# Patient Record
Sex: Female | Born: 1990 | Race: White | Hispanic: No | Marital: Single | Smoking: Former smoker
Health system: Southern US, Community
[De-identification: ages and names within clinical notes are randomized; demographics above are authoritative.]

## PROBLEM LIST (undated history)

## (undated) ENCOUNTER — Inpatient Hospital Stay (HOSPITAL_COMMUNITY): Payer: Self-pay

## (undated) DIAGNOSIS — F909 Attention-deficit hyperactivity disorder, unspecified type: Secondary | ICD-10-CM

## (undated) DIAGNOSIS — F32A Depression, unspecified: Secondary | ICD-10-CM

## (undated) DIAGNOSIS — N760 Acute vaginitis: Secondary | ICD-10-CM

## (undated) DIAGNOSIS — A749 Chlamydial infection, unspecified: Secondary | ICD-10-CM

## (undated) DIAGNOSIS — Q369 Cleft lip, unilateral: Secondary | ICD-10-CM

## (undated) DIAGNOSIS — K219 Gastro-esophageal reflux disease without esophagitis: Secondary | ICD-10-CM

## (undated) DIAGNOSIS — B9689 Other specified bacterial agents as the cause of diseases classified elsewhere: Secondary | ICD-10-CM

## (undated) DIAGNOSIS — Z9889 Other specified postprocedural states: Secondary | ICD-10-CM

## (undated) DIAGNOSIS — I10 Essential (primary) hypertension: Secondary | ICD-10-CM

## (undated) DIAGNOSIS — F319 Bipolar disorder, unspecified: Secondary | ICD-10-CM

## (undated) DIAGNOSIS — F419 Anxiety disorder, unspecified: Secondary | ICD-10-CM

## (undated) DIAGNOSIS — R112 Nausea with vomiting, unspecified: Secondary | ICD-10-CM

## (undated) DIAGNOSIS — R519 Headache, unspecified: Secondary | ICD-10-CM

## (undated) HISTORY — PX: TONSILLECTOMY: SUR1361

## (undated) HISTORY — DX: Depression, unspecified: F32.A

## (undated) HISTORY — DX: Other specified postprocedural states: Z98.890

## (undated) HISTORY — PX: FRACTURE SURGERY: SHX138

## (undated) HISTORY — DX: Gastro-esophageal reflux disease without esophagitis: K21.9

## (undated) HISTORY — DX: Other specified postprocedural states: R11.2

## (undated) HISTORY — DX: Anxiety disorder, unspecified: F41.9

## (undated) HISTORY — PX: CLEFT LIP REPAIR: SUR1164

## (undated) HISTORY — DX: Cleft lip, unilateral: Q36.9

---
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
## 2011-04-12 ENCOUNTER — Emergency Department (HOSPITAL_COMMUNITY)
Admission: EM | Admit: 2011-04-12 | Discharge: 2011-04-12 | Disposition: A | Discharge status: Home / Self Care | Payer: Medicaid Other | Attending: Emergency Medicine | Admitting: Emergency Medicine

## 2011-04-12 DIAGNOSIS — F988 Other specified behavioral and emotional disorders with onset usually occurring in childhood and adolescence: Secondary | ICD-10-CM | POA: Insufficient documentation

## 2011-04-12 DIAGNOSIS — A499 Bacterial infection, unspecified: Secondary | ICD-10-CM | POA: Insufficient documentation

## 2011-04-12 DIAGNOSIS — N760 Acute vaginitis: Secondary | ICD-10-CM | POA: Insufficient documentation

## 2011-04-12 DIAGNOSIS — B9689 Other specified bacterial agents as the cause of diseases classified elsewhere: Secondary | ICD-10-CM | POA: Insufficient documentation

## 2011-04-12 DIAGNOSIS — N390 Urinary tract infection, site not specified: Secondary | ICD-10-CM | POA: Insufficient documentation

## 2011-04-12 LAB — URINE MICROSCOPIC-ADD ON

## 2011-04-12 LAB — URINALYSIS, ROUTINE W REFLEX MICROSCOPIC
Glucose, UA: NEGATIVE mg/dL
Specific Gravity, Urine: 1.025 (ref 1.005–1.030)
pH: 5.5 (ref 5.0–8.0)

## 2011-07-02 ENCOUNTER — Encounter (HOSPITAL_COMMUNITY): Payer: Self-pay | Admitting: *Deleted

## 2011-07-02 ENCOUNTER — Emergency Department (HOSPITAL_COMMUNITY)
Admission: EM | Admit: 2011-07-02 | Discharge: 2011-07-02 | Disposition: A | Discharge status: Home / Self Care | Payer: Self-pay | Attending: Emergency Medicine | Admitting: Emergency Medicine

## 2011-07-02 DIAGNOSIS — R109 Unspecified abdominal pain: Secondary | ICD-10-CM | POA: Insufficient documentation

## 2011-07-02 DIAGNOSIS — R51 Headache: Secondary | ICD-10-CM | POA: Insufficient documentation

## 2011-07-02 DIAGNOSIS — R112 Nausea with vomiting, unspecified: Secondary | ICD-10-CM | POA: Insufficient documentation

## 2011-07-02 DIAGNOSIS — R1110 Vomiting, unspecified: Secondary | ICD-10-CM

## 2011-07-02 LAB — COMPREHENSIVE METABOLIC PANEL
ALT: 16 U/L (ref 0–35)
Albumin: 3.8 g/dL (ref 3.5–5.2)
Alkaline Phosphatase: 90 U/L (ref 39–117)
BUN: 8 mg/dL (ref 6–23)
Chloride: 101 mEq/L (ref 96–112)
Potassium: 3.5 mEq/L (ref 3.5–5.1)
Sodium: 137 mEq/L (ref 135–145)
Total Bilirubin: 0.3 mg/dL (ref 0.3–1.2)
Total Protein: 7.2 g/dL (ref 6.0–8.3)

## 2011-07-02 LAB — URINALYSIS, ROUTINE W REFLEX MICROSCOPIC
Glucose, UA: NEGATIVE mg/dL
Leukocytes, UA: NEGATIVE
Nitrite: NEGATIVE
pH: 5.5 (ref 5.0–8.0)

## 2011-07-02 LAB — DIFFERENTIAL
Basophils Relative: 0 % (ref 0–1)
Lymphs Abs: 3.9 10*3/uL (ref 0.7–4.0)
Monocytes Relative: 6 % (ref 3–12)
Neutro Abs: 9.3 10*3/uL — ABNORMAL HIGH (ref 1.7–7.7)
Neutrophils Relative %: 64 % (ref 43–77)

## 2011-07-02 LAB — POCT PREGNANCY, URINE: Preg Test, Ur: NEGATIVE

## 2011-07-02 LAB — CBC
Hemoglobin: 12.3 g/dL (ref 12.0–15.0)
MCHC: 31.8 g/dL (ref 30.0–36.0)
Platelets: 307 10*3/uL (ref 150–400)
RBC: 4.66 MIL/uL (ref 3.87–5.11)

## 2011-07-02 LAB — LIPASE, BLOOD: Lipase: 22 U/L (ref 11–59)

## 2011-07-02 MED ORDER — METOCLOPRAMIDE HCL 5 MG/ML IJ SOLN
10.0000 mg | Freq: Once | INTRAMUSCULAR | Status: AC
  Administered 2011-07-02: 10 mg via INTRAVENOUS
  Filled 2011-07-02: qty 2

## 2011-07-02 MED ORDER — DEXAMETHASONE SODIUM PHOSPHATE 10 MG/ML IJ SOLN
10.0000 mg | Freq: Once | INTRAMUSCULAR | Status: AC
  Administered 2011-07-02: 10 mg via INTRAVENOUS
  Filled 2011-07-02: qty 1

## 2011-07-02 MED ORDER — DIPHENHYDRAMINE HCL 50 MG/ML IJ SOLN
25.0000 mg | Freq: Once | INTRAMUSCULAR | Status: AC
  Administered 2011-07-02: 25 mg via INTRAVENOUS
  Filled 2011-07-02: qty 1

## 2011-07-02 NOTE — ED Notes (Signed)
Pt in c/o abd pain with vomiting and a migraine x1 week

## 2011-07-02 NOTE — ED Notes (Signed)
C/o abdominal pain with nausea and vomiting for one week---thinks she may be pregnant.  Drinking water so she will be able to void for u/a and preg. Screen.

## 2011-07-02 NOTE — ED Provider Notes (Signed)
Patient was reassessed and was symptom-free. She was discharged home in good condition. She can continue using her Tylenol as needed for symptom control. She is told she is welcome to return if she has return of her headaches. No new medications will be prescribed at this time.  Cyndra Numbers, MD 07/02/11 [phone]

## 2011-07-02 NOTE — ED Provider Notes (Signed)
History     CSN: [id number]  Arrival date & time 07/02/11  1538   First MD Initiated Contact with Patient 07/02/11 1912      Chief Complaint  Patient presents with  . Abdominal Pain  . Migraine  . Emesis    (Consider location/radiation/quality/duration/timing/severity/associated sxs/prior treatment) HPI Comments: Patient with no significant past medical history presents with multiple complaints. Patient has had intermittent headache for the past week. Headache is frontal. It is associated with phonophobia but no photophobia. Patient has had 3-4 episodes of vomiting over the past week. She has a states some lower abdominal stomach cramping which she does not have at the current time. Patient has not had any trouble walking or any visual changes. She denies injury. Patient denies fever or upper respiratory tract symptoms including sinus pain. No dental pain. Patient does not have a significant history of headaches. Patient denies chest pain, cough, shortness of breath. She denies dysuria, vaginal bleeding. No constipation or diarrhea. Patient states there is a possibility that she could be pregnant.  Patient is a 21 y.o. female presenting with headaches. The history is provided by the patient.  Headache  This is a recurrent problem. The current episode started more than 2 days ago. Episode frequency: Intermittent. Progression since onset: Waxing and waning. The headache is associated with nothing. The pain is located in the frontal region. The quality of the pain is described as dull. The pain is mild. The pain does not radiate. Associated symptoms include nausea and vomiting. Pertinent negatives include no fever, no syncope and no shortness of breath. She has tried acetaminophen for the symptoms. The treatment provided moderate relief.    History reviewed. No pertinent past medical history.  History reviewed. No pertinent past surgical history.  History reviewed. No pertinent family  history.  History  Substance Use Topics  . Smoking status: Former Games developer  . Smokeless tobacco: Not on file  . Alcohol Use: No    OB History    Grav Para Term Preterm Abortions TAB SAB Ect Mult Living                  Review of Systems  Constitutional: Negative for fever and chills.  HENT: Negative for sore throat, rhinorrhea and neck pain.   Eyes: Negative for discharge.  Respiratory: Negative for shortness of breath.   Cardiovascular: Negative for chest pain and syncope.  Gastrointestinal: Positive for nausea, vomiting and abdominal pain. Negative for diarrhea, constipation and abdominal distention.  Genitourinary: Negative for dysuria and hematuria.  Musculoskeletal: Negative for myalgias.  Skin: Negative for rash.  Neurological: Positive for headaches.  Psychiatric/Behavioral: Negative for confusion.    Allergies  Review of patient's allergies indicates no known allergies.  Home Medications   Current Outpatient Rx  Name Route Sig Dispense Refill  . ACETAMINOPHEN 500 MG PO TABS Oral Take 1,000 mg by mouth every 6 (six) hours as needed. For pain.      BP 147/83  Pulse 56  Temp(Src) 97.9 F (36.6 C) (Oral)  Resp 20  SpO2 100%  LMP 06/14/2011  Physical Exam  Nursing note and vitals reviewed. Constitutional: She is oriented to person, place, and time. She appears well-developed and well-nourished.  HENT:  Head: Normocephalic and atraumatic.  Right Ear: Tympanic membrane and external ear normal.  Left Ear: Tympanic membrane and external ear normal.  Nose: Nose normal.  Mouth/Throat: Uvula is midline, oropharynx is clear and moist and mucous membranes are normal.  Eyes: Conjunctivae and  EOM are normal. Right eye exhibits no discharge. Left eye exhibits no discharge.  Neck: Normal range of motion. Neck supple.  Cardiovascular: Normal rate and regular rhythm.   Pulmonary/Chest: Effort normal and breath sounds normal. No respiratory distress.  Abdominal: Soft.  Bowel sounds are normal. There is no tenderness. There is no rebound and no guarding.  Musculoskeletal: Normal range of motion. She exhibits no edema and no tenderness.  Neurological: She is alert and oriented to person, place, and time. She has normal strength. No cranial nerve deficit or sensory deficit. GCS eye subscore is 4. GCS verbal subscore is 5. GCS motor subscore is 6.  Skin: Skin is warm and dry. No rash noted.  Psychiatric: She has a normal mood and affect.    ED Course  Procedures (including critical care time)   Labs Reviewed  POCT PREGNANCY, URINE  URINALYSIS, ROUTINE W REFLEX MICROSCOPIC  POCT PREGNANCY, URINE  CBC  DIFFERENTIAL  COMPREHENSIVE METABOLIC PANEL  LIPASE, BLOOD   No results found.   1. Headache   2. Vomiting     7:59 PM patient seen and examined. UA/upreg ordered.  Patient may need lab tests if urine test is negative.  8:27 PM urine pregnancy is negative. Will treat for headache. Will send basic labs.  9:01 PM Handoff to Dr. Alto Denver who will follow-up with labs and dispo per lab results. Patient was informed of urine tests and plan. She is getting headache medicine now.   MDM  HA, abdominal pain, vomiting. PT appears well. Labs pending. HA treatment given. No concern for meningitis or intracranial process.        Carolee Rota, [place] 07/02/11 2103

## 2011-07-02 NOTE — ED Notes (Signed)
Patient cannot urinate at this time.

## 2011-07-03 NOTE — ED Provider Notes (Addendum)
Medical screening examination/treatment/procedure(s) were conducted as a shared visit with non-physician practitioner(s) and myself.  I personally evaluated the patient during the encounter  Cyndra Numbers, MD 07/03/11 [phone]

## 2011-08-23 ENCOUNTER — Emergency Department (HOSPITAL_COMMUNITY)
Admission: EM | Admit: 2011-08-23 | Discharge: 2011-08-24 | Disposition: A | Discharge status: Home / Self Care | Payer: Self-pay | Attending: Emergency Medicine | Admitting: Emergency Medicine

## 2011-08-23 DIAGNOSIS — R0602 Shortness of breath: Secondary | ICD-10-CM | POA: Insufficient documentation

## 2011-08-23 DIAGNOSIS — J111 Influenza due to unidentified influenza virus with other respiratory manifestations: Secondary | ICD-10-CM | POA: Insufficient documentation

## 2011-08-23 DIAGNOSIS — R062 Wheezing: Secondary | ICD-10-CM | POA: Insufficient documentation

## 2011-08-24 ENCOUNTER — Encounter (HOSPITAL_COMMUNITY): Payer: Self-pay | Admitting: Emergency Medicine

## 2011-08-24 ENCOUNTER — Emergency Department (HOSPITAL_COMMUNITY): Payer: Self-pay

## 2011-08-24 MED ORDER — IPRATROPIUM BROMIDE 0.02 % IN SOLN
0.5000 mg | Freq: Once | RESPIRATORY_TRACT | Status: AC
  Administered 2011-08-24: 0.5 mg via RESPIRATORY_TRACT
  Filled 2011-08-24: qty 2.5

## 2011-08-24 MED ORDER — HYDROCODONE-ACETAMINOPHEN 7.5-500 MG/15ML PO SOLN: 10.0000 mL | ORAL | Status: AC | PRN

## 2011-08-24 MED ORDER — ALBUTEROL SULFATE (5 MG/ML) 0.5% IN NEBU
5.0000 mg | INHALATION_SOLUTION | Freq: Once | RESPIRATORY_TRACT | Status: AC
  Administered 2011-08-24: 5 mg via RESPIRATORY_TRACT
  Filled 2011-08-24: qty 1

## 2011-08-24 MED ORDER — ALBUTEROL SULFATE HFA 108 (90 BASE) MCG/ACT IN AERS
2.0000 | INHALATION_SPRAY | RESPIRATORY_TRACT | Status: DC | PRN
  Administered 2011-08-24: 2 via RESPIRATORY_TRACT
  Filled 2011-08-24: qty 6.7

## 2011-08-24 NOTE — ED Provider Notes (Signed)
History     CSN: [id number]  Arrival date & time 08/23/11  2346   First MD Initiated Contact with Patient 08/24/11 0234      Chief Complaint  Patient presents with  . URI     (Consider location/radiation/quality/duration/timing/severity/associated sxs/prior treatment) HPI This is a 21 year old white female with a 6 day history of cough, sore throat, malaise, nasal congestion and shortness of breath. She's been taking over-the-counter Robitussin and Tylenol without relief. She states the sore throat is severe and worse with swallowing. She has some mild ear ache. She denies nausea, vomiting or diarrhea. She's not aware of wheezing. She's had minimal myalgias.  History reviewed. No pertinent past medical history.  Past Surgical History  Procedure Date  . Cleft lip repair   . Tonsillectomy     History reviewed. No pertinent family history.  History  Substance Use Topics  . Smoking status: Former Games developer  . Smokeless tobacco: Not on file  . Alcohol Use: Yes    OB History    Grav Para Term Preterm Abortions TAB SAB Ect Mult Living                  Review of Systems  All other systems reviewed and are negative.    Allergies  Review of patient's allergies indicates no known allergies.  Home Medications   Current Outpatient Rx  Name Route Sig Dispense Refill  . ACETAMINOPHEN 500 MG PO TABS Oral Take 1,000 mg by mouth every 6 (six) hours as needed. For pain.    Marland Kitchen GUAIFENESIN 100 MG/5ML PO LIQD Oral Take 200 mg by mouth 3 (three) times daily as needed.      BP 130/68  Pulse 90  Temp(Src) 98.8 F (37.1 C) (Oral)  Resp 18  SpO2 95%  LMP 07/14/2011  Physical Exam General: Well-developed, well-nourished female in no acute distress; appearance consistent with age of record HENT: normocephalic, atraumatic; no erythema of TM; no pharyngeal erythema or exudate; nasal congestion Eyes: pupils equal round and reactive to light; extraocular muscles intact Neck:  supple Heart: regular rate and rhythm; distant sounds Lungs: Decreased air movement bilaterally without frank wheezing Abdomen: soft; nondistended; nontender Extremities: No deformity; full range of motion Neurologic: Awake, alert and oriented; motor function intact in all extremities and symmetric; no facial droop Skin: Warm and dry     ED Course  Procedures (including critical care time)     MDM   Nursing notes and vitals signs, including pulse oximetry, reviewed.  Summary of this visit's results, reviewed by myself:  Imaging Studies: Dg Chest 2 View  08/24/2011  *RADIOLOGY REPORT*  Clinical Data: Cough, shortness of breath, wheezing  CHEST - 2 VIEW  Comparison: None.  Findings: Lungs are clear. No pleural effusion or pneumothorax. The cardiomediastinal contours are within normal limits. The visualized bones and soft tissues are without significant appreciable abnormality.  IMPRESSION: No acute cardiopulmonary process identified.  Original Report Authenticated By: Waneta Martins, M.D.   4:33 AM Air movement improved after albuterol neb treatment. We'll provide albuterol inhaler and pain and cough relief.          Hanley Seamen, MD 08/24/11 [phone]

## 2011-08-24 NOTE — ED Notes (Addendum)
Pt presented to the Er with c/o cough, productive, generalized weakness, sore throat and otalgia for the last 6 days now, also reports taking while at home Mucinex, Tylenol, Alcasel for cold. Lungs clear in all lobes. Pt also reports missing her menstrual cycle.

## 2011-08-24 NOTE — ED Notes (Signed)
Pt states she began having a sore throat on Sunday accompanied with cough that has gotten worse.  States that today both her ears started hurting.  "My whole head is stopped up".

## 2012-04-27 ENCOUNTER — Emergency Department (HOSPITAL_COMMUNITY)
Admission: EM | Admit: 2012-04-27 | Discharge: 2012-04-27 | Disposition: A | Discharge status: Home / Self Care | Payer: Self-pay | Attending: Emergency Medicine | Admitting: Emergency Medicine

## 2012-04-27 ENCOUNTER — Encounter (HOSPITAL_COMMUNITY): Payer: Self-pay | Admitting: Emergency Medicine

## 2012-04-27 DIAGNOSIS — Z87891 Personal history of nicotine dependence: Secondary | ICD-10-CM | POA: Insufficient documentation

## 2012-04-27 DIAGNOSIS — F909 Attention-deficit hyperactivity disorder, unspecified type: Secondary | ICD-10-CM | POA: Insufficient documentation

## 2012-04-27 DIAGNOSIS — E669 Obesity, unspecified: Secondary | ICD-10-CM | POA: Insufficient documentation

## 2012-04-27 DIAGNOSIS — A749 Chlamydial infection, unspecified: Secondary | ICD-10-CM | POA: Insufficient documentation

## 2012-04-27 DIAGNOSIS — Z3202 Encounter for pregnancy test, result negative: Secondary | ICD-10-CM | POA: Insufficient documentation

## 2012-04-27 DIAGNOSIS — B3731 Acute candidiasis of vulva and vagina: Secondary | ICD-10-CM | POA: Insufficient documentation

## 2012-04-27 DIAGNOSIS — B373 Candidiasis of vulva and vagina: Secondary | ICD-10-CM | POA: Insufficient documentation

## 2012-04-27 HISTORY — DX: Other specified bacterial agents as the cause of diseases classified elsewhere: N76.0

## 2012-04-27 HISTORY — DX: Other specified bacterial agents as the cause of diseases classified elsewhere: B96.89

## 2012-04-27 HISTORY — DX: Chlamydial infection, unspecified: A74.9

## 2012-04-27 HISTORY — DX: Attention-deficit hyperactivity disorder, unspecified type: F90.9

## 2012-04-27 LAB — GC/CHLAMYDIA PROBE AMP: GC Probe RNA: NEGATIVE

## 2012-04-27 LAB — URINALYSIS, ROUTINE W REFLEX MICROSCOPIC
Bilirubin Urine: NEGATIVE
Ketones, ur: NEGATIVE mg/dL
Nitrite: NEGATIVE
Protein, ur: NEGATIVE mg/dL
Urobilinogen, UA: 0.2 mg/dL (ref 0.0–1.0)
pH: 6 (ref 5.0–8.0)

## 2012-04-27 LAB — URINE MICROSCOPIC-ADD ON

## 2012-04-27 LAB — WET PREP, GENITAL: Trich, Wet Prep: NONE SEEN

## 2012-04-27 MED ORDER — FLUCONAZOLE 200 MG PO TABS: 200.0000 mg | ORAL_TABLET | Freq: Every day | ORAL | Status: AC

## 2012-04-27 MED ORDER — FLUCONAZOLE 100 MG PO TABS
200.0000 mg | ORAL_TABLET | Freq: Once | ORAL | Status: AC
  Administered 2012-04-27: 200 mg via ORAL
  Filled 2012-04-27: qty 1

## 2012-04-27 NOTE — ED Provider Notes (Signed)
History     CSN: [id number]  Arrival date & time 04/27/12  4401   First MD Initiated Contact with Patient 04/27/12 0115      Chief Complaint  Patient presents with  . Vaginal Discharge   HPI  History provided by the patient. Patient is a 21 year old female with past history of Chlamydia and bacterial vaginosis presents with complaints of thick white vaginal discharge and burning and irritation to the genital area. Symptoms have been present for the past 2-3 days. Patient states she recently finished an antibiotic to treat a skin abscess infection. She reports her infection improved without complaints and shortly after she began having new symptoms. Patient also had recent diagnosis and treatment for Chlamydia 2 weeks ago. She and her partner were both treated. She denies any other complaints at this time. Denies any abdominal pain. Denies any fever, chills, sweats, nausea or vomiting.    Past Medical History  Diagnosis Date  . ADHD (attention deficit hyperactivity disorder)   . Chlamydia   . BV (bacterial vaginosis)     Past Surgical History  Procedure Date  . Cleft lip repair   . Tonsillectomy     History reviewed. No pertinent family history.  History  Substance Use Topics  . Smoking status: Former Games developer  . Smokeless tobacco: Not on file  . Alcohol Use: Yes    OB History    Grav Para Term Preterm Abortions TAB SAB Ect Mult Living                  Review of Systems  Constitutional: Negative for fever, chills, diaphoresis and appetite change.  Gastrointestinal: Negative for nausea, vomiting, abdominal pain, diarrhea and constipation.  Genitourinary: Positive for vaginal discharge and vaginal pain. Negative for dysuria, frequency, hematuria, flank pain and vaginal bleeding.  Skin: Negative for rash.  All other systems reviewed and are negative.    Allergies  Review of patient's allergies indicates no known allergies.  Home Medications  No current outpatient  prescriptions on file.  BP 107/55  Pulse 93  Temp 97.8 F (36.6 C) (Oral)  Resp 16  SpO2 99%  LMP 02/25/2012  Physical Exam  Nursing note and vitals reviewed. Constitutional: She is oriented to person, place, and time. She appears well-developed and well-nourished. No distress.  HENT:  Head: Normocephalic.  Cardiovascular: Normal rate and regular rhythm.   Pulmonary/Chest: Effort normal and breath sounds normal.  Abdominal: Soft. There is no tenderness. There is no rebound and no guarding.       Obese  Genitourinary: Cervix exhibits discharge. Cervix exhibits no motion tenderness and no friability. Right adnexum displays no mass and no tenderness. Left adnexum displays no mass and no tenderness.       Chaperone was present. There is erythema of the labia minora and vaginal area with thick white discharge. No vesicles or rash.  Neurological: She is alert and oriented to person, place, and time.  Skin: Skin is warm and dry. No rash noted.  Psychiatric: She has a normal mood and affect. Her behavior is normal.    ED Course  Procedures  Results for orders placed during the hospital encounter of 04/27/12  WET PREP, GENITAL      Component Value Range   Yeast Wet Prep HPF POC FEW (*) NONE SEEN   Trich, Wet Prep NONE SEEN  NONE SEEN   Clue Cells Wet Prep HPF POC FEW (*) NONE SEEN   WBC, Wet Prep HPF POC MANY (*)  NONE SEEN  URINALYSIS, ROUTINE W REFLEX MICROSCOPIC      Component Value Range   Color, Urine YELLOW  YELLOW   APPearance CLEAR  CLEAR   Specific Gravity, Urine 1.030  1.005 - 1.030   pH 6.0  5.0 - 8.0   Glucose, UA NEGATIVE  NEGATIVE mg/dL   Hgb urine dipstick MODERATE (*) NEGATIVE   Bilirubin Urine NEGATIVE  NEGATIVE   Ketones, ur NEGATIVE  NEGATIVE mg/dL   Protein, ur NEGATIVE  NEGATIVE mg/dL   Urobilinogen, UA 0.2  0.0 - 1.0 mg/dL   Nitrite NEGATIVE  NEGATIVE   Leukocytes, UA NEGATIVE  NEGATIVE  PREGNANCY, URINE      Component Value Range   Preg Test, Ur  NEGATIVE  NEGATIVE  URINE MICROSCOPIC-ADD ON      Component Value Range   Squamous Epithelial / LPF RARE  RARE   WBC, UA 0-2  <3 WBC/hpf   RBC / HPF 7-10  <3 RBC/hpf   Bacteria, UA RARE  RARE   Urine-Other MUCOUS PRESENT         1. Yeast vaginitis       MDM  Patient seen and evaluated. Patient appears comfortable in no acute distress. Symptoms and history concerning for descending infection. Will give dose treatment here.   Symptoms and findings are consistent with significant yeast infection and irritation. Patient given 1 dose Diflucan in the emergency room. She given a prescription for similar doses for the next 3 days to be sure her infection improves. Patient also given referral to followup with OB/GYN.     Angus Seller, PA 04/27/12 [phone]

## 2012-04-27 NOTE — ED Notes (Signed)
Pt reports vaginal discharge and discomfort over the last 24 hrs denies dysuria but admits to white discharge

## 2012-04-27 NOTE — ED Notes (Signed)
Patient reports vaginal discharge, itching and irritation.  Discharge is thick, white, and pasty.  Patient reports that she was recently treated for Chlamydia two weeks ago (partner was also treated).  Patient denies history of yeast infection; reports history of BV.  Patient reports that there is a chance she could be pregnant (last period was two months ago).

## 2012-04-27 NOTE — ED Provider Notes (Signed)
Medical screening examination/treatment/procedure(s) were performed by non-physician practitioner and as supervising physician I was immediately available for consultation/collaboration.   Philopater Mucha T Magali Bray, MD 04/27/12 0807 

## 2012-04-27 NOTE — ED Notes (Addendum)
Pt admits to being treated for chlamydia and was given a dose of antibiotics which she finished.  Asked if pt's significant other was also treated.  Pt stated, "Yes.  We both were given medication for it."

## 2014-01-18 ENCOUNTER — Emergency Department: Payer: Self-pay | Admitting: Emergency Medicine

## 2014-01-18 LAB — CBC WITH DIFFERENTIAL/PLATELET
BASOS ABS: 0.1 10*3/uL (ref 0.0–0.1)
Basophil %: 0.6 %
EOS ABS: 0.7 10*3/uL (ref 0.0–0.7)
EOS PCT: 3.9 %
HCT: 38.3 % (ref 35.0–47.0)
HGB: 12.1 g/dL (ref 12.0–16.0)
LYMPHS ABS: 3.9 10*3/uL — AB (ref 1.0–3.6)
LYMPHS PCT: 21.7 %
MCH: 27 pg (ref 26.0–34.0)
MCHC: 31.6 g/dL — AB (ref 32.0–36.0)
MCV: 85 fL (ref 80–100)
MONOS PCT: 5.3 %
Monocyte #: 0.9 x10 3/mm (ref 0.2–0.9)
NEUTROS ABS: 12.2 10*3/uL — AB (ref 1.4–6.5)
Neutrophil %: 68.5 %
PLATELETS: 258 10*3/uL (ref 150–440)
RBC: 4.48 10*6/uL (ref 3.80–5.20)
RDW: 14 % (ref 11.5–14.5)
WBC: 17.8 10*3/uL — AB (ref 3.6–11.0)

## 2014-01-18 LAB — URINALYSIS, COMPLETE
BACTERIA: NONE SEEN
BILIRUBIN, UR: NEGATIVE
Blood: NEGATIVE
Glucose,UR: NEGATIVE mg/dL (ref 0–75)
Ketone: NEGATIVE
Nitrite: NEGATIVE
PH: 7 (ref 4.5–8.0)
PROTEIN: NEGATIVE
RBC,UR: 1 /HPF (ref 0–5)
SPECIFIC GRAVITY: 1.012 (ref 1.003–1.030)

## 2014-01-18 LAB — COMPREHENSIVE METABOLIC PANEL
ALBUMIN: 3.2 g/dL — AB (ref 3.4–5.0)
ALK PHOS: 84 U/L
ANION GAP: 7 (ref 7–16)
AST: 18 U/L (ref 15–37)
BUN: 6 mg/dL — ABNORMAL LOW (ref 7–18)
Bilirubin,Total: 0.4 mg/dL (ref 0.2–1.0)
CHLORIDE: 108 mmol/L — AB (ref 98–107)
CO2: 23 mmol/L (ref 21–32)
CREATININE: 0.71 mg/dL (ref 0.60–1.30)
Calcium, Total: 8.4 mg/dL — ABNORMAL LOW (ref 8.5–10.1)
EGFR (Non-African Amer.): >60
GLUCOSE: 95 mg/dL (ref 65–99)
Osmolality: 273 (ref 275–301)
Potassium: 3.6 mmol/L (ref 3.5–5.1)
SGPT (ALT): 23 U/L
SODIUM: 138 mmol/L (ref 136–145)
Total Protein: 7.1 g/dL (ref 6.4–8.2)

## 2014-01-18 LAB — LIPASE, BLOOD: LIPASE: 83 U/L (ref 73–393)

## 2015-06-18 HISTORY — PX: ANKLE SURGERY: SHX546

## 2015-11-28 ENCOUNTER — Emergency Department (HOSPITAL_BASED_OUTPATIENT_CLINIC_OR_DEPARTMENT_OTHER): Payer: Self-pay

## 2015-11-28 ENCOUNTER — Emergency Department (HOSPITAL_BASED_OUTPATIENT_CLINIC_OR_DEPARTMENT_OTHER)
Admission: EM | Admit: 2015-11-28 | Discharge: 2015-11-28 | Disposition: A | Discharge status: Home / Self Care | Payer: Self-pay | Attending: Emergency Medicine | Admitting: Emergency Medicine

## 2015-11-28 ENCOUNTER — Encounter (HOSPITAL_BASED_OUTPATIENT_CLINIC_OR_DEPARTMENT_OTHER): Payer: Self-pay | Admitting: Emergency Medicine

## 2015-11-28 DIAGNOSIS — F909 Attention-deficit hyperactivity disorder, unspecified type: Secondary | ICD-10-CM | POA: Insufficient documentation

## 2015-11-28 DIAGNOSIS — R195 Other fecal abnormalities: Secondary | ICD-10-CM | POA: Insufficient documentation

## 2015-11-28 DIAGNOSIS — Z87891 Personal history of nicotine dependence: Secondary | ICD-10-CM | POA: Insufficient documentation

## 2015-11-28 DIAGNOSIS — R112 Nausea with vomiting, unspecified: Secondary | ICD-10-CM | POA: Insufficient documentation

## 2015-11-28 DIAGNOSIS — R1030 Lower abdominal pain, unspecified: Secondary | ICD-10-CM | POA: Insufficient documentation

## 2015-11-28 LAB — CBC WITH DIFFERENTIAL/PLATELET
BASOS ABS: 0 10*3/uL (ref 0.0–0.1)
BASOS PCT: 0 %
EOS ABS: 0.4 10*3/uL (ref 0.0–0.7)
Eosinophils Relative: 4 %
HCT: 39.1 % (ref 36.0–46.0)
HEMOGLOBIN: 12.6 g/dL (ref 12.0–15.0)
Lymphocytes Relative: 24 %
Lymphs Abs: 2.5 10*3/uL (ref 0.7–4.0)
MCH: 28 pg (ref 26.0–34.0)
MCHC: 32.2 g/dL (ref 30.0–36.0)
MCV: 86.9 fL (ref 78.0–100.0)
MONOS PCT: 6 %
Monocytes Absolute: 0.6 10*3/uL (ref 0.1–1.0)
NEUTROS ABS: 7 10*3/uL (ref 1.7–7.7)
NEUTROS PCT: 66 %
Platelets: 250 10*3/uL (ref 150–400)
RBC: 4.5 MIL/uL (ref 3.87–5.11)
RDW: 13.7 % (ref 11.5–15.5)
WBC: 10.6 10*3/uL — AB (ref 4.0–10.5)

## 2015-11-28 LAB — COMPREHENSIVE METABOLIC PANEL
ALBUMIN: 3.8 g/dL (ref 3.5–5.0)
ALK PHOS: 70 U/L (ref 38–126)
ALT: 18 U/L (ref 14–54)
ANION GAP: 5 (ref 5–15)
AST: 15 U/L (ref 15–41)
BUN: 10 mg/dL (ref 6–20)
CALCIUM: 8.8 mg/dL — AB (ref 8.9–10.3)
CO2: 28 mmol/L (ref 22–32)
Chloride: 106 mmol/L (ref 101–111)
Creatinine, Ser: 0.65 mg/dL (ref 0.44–1.00)
GFR calc Af Amer: >60 mL/min (ref >60)
GFR calc non Af Amer: >60 mL/min (ref >60)
GLUCOSE: 88 mg/dL (ref 65–99)
POTASSIUM: 3.9 mmol/L (ref 3.5–5.1)
SODIUM: 139 mmol/L (ref 135–145)
Total Bilirubin: 0.6 mg/dL (ref 0.3–1.2)
Total Protein: 7.3 g/dL (ref 6.5–8.1)

## 2015-11-28 LAB — PREGNANCY, URINE: Preg Test, Ur: NEGATIVE

## 2015-11-28 LAB — URINALYSIS, ROUTINE W REFLEX MICROSCOPIC
BILIRUBIN URINE: NEGATIVE
Glucose, UA: NEGATIVE mg/dL
Hgb urine dipstick: NEGATIVE
Ketones, ur: NEGATIVE mg/dL
Leukocytes, UA: NEGATIVE
NITRITE: NEGATIVE
Protein, ur: NEGATIVE mg/dL
SPECIFIC GRAVITY, URINE: 1.022 (ref 1.005–1.030)
pH: 5.5 (ref 5.0–8.0)

## 2015-11-28 MED ORDER — ONDANSETRON 4 MG PO TBDP: 4.0000 mg | ORAL_TABLET | Freq: Three times a day (TID) | ORAL | Status: DC | PRN

## 2015-11-28 MED ORDER — SODIUM CHLORIDE 0.9 % IV BOLUS (SEPSIS)
1000.0000 mL | Freq: Once | INTRAVENOUS | Status: AC
  Administered 2015-11-28: 1000 mL via INTRAVENOUS

## 2015-11-28 MED ORDER — KETOROLAC TROMETHAMINE 30 MG/ML IJ SOLN
30.0000 mg | Freq: Once | INTRAMUSCULAR | Status: AC
  Administered 2015-11-28: 30 mg via INTRAVENOUS
  Filled 2015-11-28: qty 1

## 2015-11-28 MED ORDER — PROMETHAZINE HCL 25 MG/ML IJ SOLN
12.5000 mg | Freq: Once | INTRAMUSCULAR | Status: AC
  Administered 2015-11-28: 12.5 mg via INTRAVENOUS
  Filled 2015-11-28: qty 1

## 2015-11-28 MED ORDER — MORPHINE SULFATE (PF) 4 MG/ML IV SOLN
4.0000 mg | INTRAVENOUS | Status: DC | PRN
  Administered 2015-11-28 (×2): 4 mg via INTRAVENOUS
  Filled 2015-11-28 (×2): qty 1

## 2015-11-28 MED ORDER — DICYCLOMINE HCL 20 MG PO TABS: 20.0000 mg | ORAL_TABLET | Freq: Two times a day (BID) | ORAL | Status: DC

## 2015-11-28 MED ORDER — IOPAMIDOL (ISOVUE-300) INJECTION 61%
100.0000 mL | Freq: Once | INTRAVENOUS | Status: AC | PRN
  Administered 2015-11-28: 100 mL via INTRAVENOUS

## 2015-11-28 MED ORDER — ONDANSETRON HCL 4 MG/2ML IJ SOLN
4.0000 mg | Freq: Once | INTRAMUSCULAR | Status: AC
  Administered 2015-11-28: 4 mg via INTRAVENOUS
  Filled 2015-11-28: qty 2

## 2015-11-28 NOTE — ED Notes (Signed)
Pt having abnormal uterine bleeding for 3 weeks.  Pt states she believes she is needing her Implanon removed.  Pt having lower abdominal pain since yesterday, some N/V today.  No diarrhea.

## 2015-11-28 NOTE — ED Notes (Signed)
Pt's discharge and prescriptions. Placed in an envelope to be mailed.

## 2015-11-28 NOTE — ED Notes (Signed)
Pt leaving dept with friend.

## 2015-11-28 NOTE — Discharge Instructions (Signed)

## 2015-11-28 NOTE — ED Notes (Signed)
Pt called RN to room and states her mother cannot come get her and will not get off work until 1700. Pt instructed we cannot discharge her for 8 hours after the last morphine because she will not be safe to drive. Pt takes and cell phone to call someone.

## 2015-11-28 NOTE — ED Notes (Signed)
Vomited small amt of clear fluid in bag.

## 2015-11-28 NOTE — ED Notes (Signed)
Pt in bed watching TV and on the phone. Pt has no needs from nursing staff at this time. Pt awaiting for ride to arrive in order to be discharged.

## 2015-11-28 NOTE — ED Notes (Signed)
Pt states she has a ride home if she is given morphine.

## 2015-11-28 NOTE — ED Notes (Signed)
Went to discharge pt and pt states her mother cannot come get her and she will be driving home. Instructed pt she has had morphine and we can not discharge her to drive herself home. Pt will try calling her mother again.

## 2015-11-28 NOTE — ED Notes (Signed)
Once pt's ride arrived, pt walked out of room and left without receiving AVS, prescriptions, or signing for discharge. Secretary Val witnessed pt leaving department with her friend who is reported to be driving her. Verbal discharge instructions were given prior to pt leaving.

## 2015-11-28 NOTE — ED Provider Notes (Signed)
CSN: [id number]     Arrival date & time 11/28/15  1045 History   First MD Initiated Contact with Patient 11/28/15 1046     Chief Complaint  Patient presents with  . Abdominal Pain     HPI  Patient presents for evaluation of abdominal pain.  States that she's had pain for 48 hours. Started having vomiting yesterday. Unable to keep any medicines. Triage note states had normal uterine bleeding. She states she has not had any bleeding or spotting now. But does state that she's become irregular over the last year and was concerned about her Implanon. For the first year she had no bleeding. For the last years. Regular. But denies current or recent bleeding.  Pain is suprapubic as well as left lower abdomen. States it is relieved somewhat with "curling up in the fetal position". States that she has some blood in her stool 2 days ago but is uncertain she's had any since. No rectal pain or hemorrhoids. No fever.  Past Medical History  Diagnosis Date  . ADHD (attention deficit hyperactivity disorder)   . Chlamydia   . BV (bacterial vaginosis)    Past Surgical History  Procedure Laterality Date  . Cleft lip repair    . Tonsillectomy     No family history on file. Social History  Substance Use Topics  . Smoking status: Former Games developer  . Smokeless tobacco: None  . Alcohol Use: Yes   OB History    No data available     Review of Systems  Constitutional: Negative for fever, chills, diaphoresis, appetite change and fatigue.  HENT: Negative for mouth sores, sore throat and trouble swallowing.   Eyes: Negative for visual disturbance.  Respiratory: Negative for cough, chest tightness, shortness of breath and wheezing.   Cardiovascular: Negative for chest pain.  Gastrointestinal: Positive for nausea, vomiting, abdominal pain and blood in stool. Negative for diarrhea and abdominal distention.  Endocrine: Negative for polydipsia, polyphagia and polyuria.  Genitourinary: Negative for dysuria,  frequency and hematuria.  Musculoskeletal: Negative for gait problem.  Skin: Negative for color change, pallor and rash.  Neurological: Negative for dizziness, syncope, light-headedness and headaches.  Hematological: Does not bruise/bleed easily.  Psychiatric/Behavioral: Negative for behavioral problems and confusion.      Allergies  Review of patient's allergies indicates no known allergies.  Home Medications   Prior to Admission medications   Medication Sig Start Date End Date Taking? Authorizing Provider  dicyclomine (BENTYL) 20 MG tablet Take 1 tablet (20 mg total) by mouth 2 (two) times daily. 11/28/15   Rolland Porter, MD  ondansetron (ZOFRAN ODT) 4 MG disintegrating tablet Take 1 tablet (4 mg total) by mouth every 8 (eight) hours as needed for nausea. 11/28/15   Rolland Porter, MD   BP 108/55 mmHg  Pulse 70  Temp(Src) 97.9 F (36.6 C) (Oral)  Resp 18  Ht  (1.6 m)  Wt 235 lb (106.595 kg)  BMI 41.64 kg/m2  SpO2 100%  LMP 11/23/2015 Physical Exam  Constitutional: She is oriented to person, place, and time. She appears well-developed and well-nourished. No distress.  HENT:  Head: Normocephalic.  Eyes: Conjunctivae are normal. Pupils are equal, round, and reactive to light. No scleral icterus.  Neck: Normal range of motion. Neck supple. No thyromegaly present.  Cardiovascular: Normal rate and regular rhythm.  Exam reveals no gallop and no friction rub.   No murmur heard. Pulmonary/Chest: Effort normal and breath sounds normal. No respiratory distress. She has no wheezes. She  has no rales.  Abdominal: Soft. Bowel sounds are normal. She exhibits no distension. There is no tenderness. There is no rebound.    Tenderness in the suprapubic abdomen somewhat to the left of midline. Nontender over the right lower quadrant.  Musculoskeletal: Normal range of motion.  Neurological: She is alert and oriented to person, place, and time.  Skin: Skin is warm and dry. No rash noted.    Psychiatric: She has a normal mood and affect. Her behavior is normal.    ED Course  Procedures (including critical care time) Labs Review Labs Reviewed  URINALYSIS, ROUTINE W REFLEX MICROSCOPIC (NOT AT [facility]) - Abnormal; Notable for the following:    APPearance CLOUDY (*)    All other components within normal limits  CBC WITH DIFFERENTIAL/PLATELET - Abnormal; Notable for the following:    WBC 10.6 (*)    All other components within normal limits  COMPREHENSIVE METABOLIC PANEL - Abnormal; Notable for the following:    Calcium 8.8 (*)    All other components within normal limits  PREGNANCY, URINE    Imaging Review Ct Abdomen Pelvis W Contrast  11/28/2015  CLINICAL DATA:  Lower abdominal pain for 1 day. Nausea and vomiting. Dysfunctional uterine bleeding for 3 weeks EXAM: CT ABDOMEN AND PELVIS WITH CONTRAST TECHNIQUE: Multidetector CT imaging of the abdomen and pelvis was performed using the standard protocol following bolus administration of intravenous contrast. Oral contrast was also administered. CONTRAST:  ISOVUE-300 IOPAMIDOL (ISOVUE-300) INJECTION 61% COMPARISON:  May 19, 2015 FINDINGS: Lower chest:  Lung bases are clear. Hepatobiliary: No focal liver lesions are identified. Gallbladder wall is not appreciably thickened. There is no biliary duct dilatation. Pancreas: No pancreatic mass or inflammatory focus. Spleen: No splenic lesions are evident. Adrenals/Urinary Tract: Adrenals appear normal bilaterally. Kidneys bilaterally show no appreciable mass or hydronephrosis on either side. There is no renal or ureteral calculus on either side. The urinary bladder is midline with wall thickness within normal limits for degree of urinary bladder distention. Stomach/Bowel: There are scattered sigmoid diverticula without diverticulitis. There is no bowel wall or mesenteric thickening. No bowel obstruction. No free air or portal venous air. Vascular/Lymphatic: There is no abdominal aortic  aneurysm. No vascular lesions are evident. There is no appreciable adenopathy in the abdomen or pelvis. Reproductive: Uterus is anteverted. There is a dominant follicle in the left ovary measuring 2.6 x 2.2 cm. No other pelvic mass. No free pelvic fluid. Other: Appendix appears normal. There is no ascites or abscess in the abdomen or pelvis. There is a minimal ventral hernia containing only fat. Musculoskeletal: There are no blastic or lytic bone lesions. There is lumbar levoscoliosis. There is no intramuscular or abdominal wall lesion. IMPRESSION: A cause for patient's symptoms has not been established with this study. There are occasional sigmoid diverticula without diverticulitis. There is no mesenteric thickening or inflammation. No bowel obstruction. No abscess. Appendix appears normal. There is a physiologic follicle in left ovary. No other pelvic mass. No renal or ureteral calculus. No hydronephrosis. There is a minimal ventral hernia containing only fat. Electronically Signed   By: Bretta Bang III M.D.   On: 11/28/2015 12:59   I have personally reviewed and evaluated these images and lab results as part of my medical decision-making.   EKG Interpretation None      MDM   Final diagnoses:  Lower abdominal pain    CT without findings. Patient declines pelvic exams. Declines discharge or bleeding. However she continues to inquire about  having her Implanon removed. I declined this. She is appropriate for outpatient treatment. Bentyl and Zofran to use as needed. Clear liquids advancing her diet.    Rolland PorterMark Hester Forget, MD 11/28/15 [phone]

## 2017-05-15 ENCOUNTER — Emergency Department (HOSPITAL_COMMUNITY)
Admission: EM | Admit: 2017-05-15 | Discharge: 2017-05-15 | Disposition: A | Discharge status: Home / Self Care | Payer: Self-pay | Attending: Emergency Medicine | Admitting: Emergency Medicine

## 2017-05-15 ENCOUNTER — Encounter (HOSPITAL_COMMUNITY): Payer: Self-pay | Admitting: Emergency Medicine

## 2017-05-15 DIAGNOSIS — Y929 Unspecified place or not applicable: Secondary | ICD-10-CM | POA: Insufficient documentation

## 2017-05-15 DIAGNOSIS — I10 Essential (primary) hypertension: Secondary | ICD-10-CM

## 2017-05-15 DIAGNOSIS — F909 Attention-deficit hyperactivity disorder, unspecified type: Secondary | ICD-10-CM | POA: Insufficient documentation

## 2017-05-15 DIAGNOSIS — Y9389 Activity, other specified: Secondary | ICD-10-CM | POA: Insufficient documentation

## 2017-05-15 DIAGNOSIS — S91209A Unspecified open wound of unspecified toe(s) with damage to nail, initial encounter: Secondary | ICD-10-CM

## 2017-05-15 DIAGNOSIS — Y999 Unspecified external cause status: Secondary | ICD-10-CM | POA: Insufficient documentation

## 2017-05-15 DIAGNOSIS — Z79899 Other long term (current) drug therapy: Secondary | ICD-10-CM | POA: Insufficient documentation

## 2017-05-15 DIAGNOSIS — Z23 Encounter for immunization: Secondary | ICD-10-CM | POA: Insufficient documentation

## 2017-05-15 DIAGNOSIS — Z87891 Personal history of nicotine dependence: Secondary | ICD-10-CM | POA: Insufficient documentation

## 2017-05-15 DIAGNOSIS — W2209XA Striking against other stationary object, initial encounter: Secondary | ICD-10-CM | POA: Insufficient documentation

## 2017-05-15 DIAGNOSIS — S91202A Unspecified open wound of left great toe with damage to nail, initial encounter: Secondary | ICD-10-CM | POA: Insufficient documentation

## 2017-05-15 MED ORDER — LIDOCAINE HCL (PF) 1 % IJ SOLN
5.0000 mL | Freq: Once | INTRAMUSCULAR | Status: AC
  Administered 2017-05-15: 5 mL via INTRADERMAL
  Filled 2017-05-15: qty 5

## 2017-05-15 MED ORDER — MELOXICAM 15 MG PO TABS: 15.0000 mg | ORAL_TABLET | Freq: Every day | ORAL | 0 refills | Status: DC

## 2017-05-15 MED ORDER — TETANUS-DIPHTH-ACELL PERTUSSIS 5-2.5-18.5 LF-MCG/0.5 IM SUSP
0.5000 mL | Freq: Once | INTRAMUSCULAR | Status: AC
  Administered 2017-05-15: 0.5 mL via INTRAMUSCULAR
  Filled 2017-05-15: qty 0.5

## 2017-05-15 NOTE — Discharge Instructions (Signed)
Contact a health care provider if: You have swelling or pain that gets worse instead of better. You have fluid, blood, or pus coming from your wound. Your wound smells bad. Get help right away if: You have bleeding that does not stop, even when you apply pressure to the wound. You have a temperature that is higher than 104F (40C). You cannot move your fingers or toes. The affected finger or toe looks white or black.

## 2017-05-15 NOTE — ED Provider Notes (Signed)
[facility]-EMERGENCY DEPT Provider Note   CSN: [id number] Arrival date & time: 05/15/17  1005     History   Chief Complaint Chief Complaint  Patient presents with  . Toe Pain    HPI Alyssa Ray is a 26 y.o. female presents emergency department with chief complaint of left great toe nail avulsion.  Patient states that she tripped on the stairs causing her foot to go backward and caught the end of her toenail and avulsed the nail.  This occurred 2 days ago.  She had great difficulty cleaning it because of its tenderness.  He has been oozing serous discharge.  She has no pain out of proportion to the initial injury and he denies any heat redness swelling fever or chills.  She was unsure if she should cover it or keep it open.  She has no other complaints at this time. HPI  Past Medical History:  Diagnosis Date  . ADHD (attention deficit hyperactivity disorder)   . BV (bacterial vaginosis)   . Chlamydia     Patient Active Problem List   Diagnosis Date Noted  . ADHD (attention deficit hyperactivity disorder)   . Chlamydia     Past Surgical History:  Procedure Laterality Date  . CLEFT LIP REPAIR    . TONSILLECTOMY      OB History    No data available       Home Medications    Prior to Admission medications   Medication Sig Start Date End Date Taking? Authorizing Provider  dicyclomine (BENTYL) 20 MG tablet Take 1 tablet (20 mg total) by mouth 2 (two) times daily. 11/28/15   Rolland PorterJames, Mark, MD  ondansetron (ZOFRAN ODT) 4 MG disintegrating tablet Take 1 tablet (4 mg total) by mouth every 8 (eight) hours as needed for nausea. 11/28/15   Rolland PorterJames, Mark, MD    Family History No family history on file.  Social History Social History   Tobacco Use  . Smoking status: Former Smoker  Substance Use Topics  . Alcohol use: Yes  . Drug use: Yes    Types: Marijuana     Allergies   Patient has no known allergies.   Review of Systems Review of  Systems Positive for nail avulsion, gait abnormality Negative for chills, fever  Physical Exam Updated Vital Signs BP (!) 150/77 (BP Location: Right Arm)   Pulse 71   Temp 98.4 F (36.9 C) (Oral)   Resp 16   LMP 04/02/2017   SpO2 100%   Physical Exam  Constitutional: She is oriented to person, place, and time. She appears well-developed and well-nourished. No distress.  HENT:  Head: Normocephalic and atraumatic.  Eyes: Conjunctivae are normal. No scleral icterus.  Neck: Normal range of motion.  Cardiovascular: Normal rate, regular rhythm and normal heart sounds. Exam reveals no gallop and no friction rub.  No murmur heard. Pulmonary/Chest: Effort normal and breath sounds normal. No respiratory distress.  Abdominal: Soft. Bowel sounds are normal. She exhibits no distension and no mass. There is no tenderness. There is no guarding.  Musculoskeletal:  Complete avulsion of the nail of the L hallux. Serous drainage. Does not appear clean. No erythema, warmth or signs of infection.  Neurological: She is alert and oriented to person, place, and time.  Skin: Skin is warm and dry. She is not diaphoretic.  Psychiatric: Her behavior is normal.  Nursing note and vitals reviewed.    ED Treatments / Results  Labs (all labs ordered are listed, but  only abnormal results are displayed) Labs Reviewed - No data to display  EKG  EKG Interpretation None       Radiology No results found.  Procedures .Nerve Block Date/Time: 05/15/2017 12:40 PM Performed by: Arthor CaptainHarris, Tiffney Haughton, PA-C Authorized by: Arthor CaptainHarris, Azura Tufaro, PA-C   Consent:    Consent obtained:  Verbal   Consent given by:  Patient   Risks discussed:  Bleeding, infection, pain and unsuccessful block Indications:    Indications:  Pain relief Location:    Body area:  Lower extremity   Lower extremity nerve blocked: digital.   Laterality:  Left Pre-procedure details:    Skin preparation:  Alcohol Procedure details (see MAR  for exact dosages):    Block needle gauge:  27 G   Anesthetic injected:  Lidocaine 1% w/o epi   Injection procedure:  Anatomic landmarks identified   Paresthesia:  None Post-procedure details:    Dressing:  Sterile dressing   Outcome:  Anesthesia achieved   Patient tolerance of procedure:  Tolerated well, no immediate complications   (including critical care time)  Medications Ordered in ED Medications  lidocaine (PF) (XYLOCAINE) 1 % injection 5 mL (5 mLs Intradermal Given 05/15/17 1132)  lidocaine (PF) (XYLOCAINE) 1 % injection 5 mL (5 mLs Intradermal Given 05/15/17 1132)     Initial Impression / Assessment and Plan / ED Course  I have reviewed the triage vital signs and the nursing notes.  Pertinent labs & imaging results that were available during my care of the patient were reviewed by me and considered in my medical decision making (see chart for details).     Patient with total avulsion of the left great toe nail.  This includes nail down to the germinal matrix.  Patient tolerated a digital block with total anesthesia of the toe in order to cleanse it thoroughly.  This was then dressed with Adaptic and sterile gauze.  Patient placed in a postop shoe for comfort.  Given home care instructions and return precautions.  She appears appropriate for discharge at this time.  She will follow-up with a podiatrist.  Final Clinical Impressions(s) / ED Diagnoses   Final diagnoses:  Nail avulsion of toe, initial encounter  Hypertension, unspecified type    ED Discharge Orders    None       Arthor CaptainHarris, Rashaan Wyles, PA-C 05/15/17 1244    Alvira MondaySchlossman, Erin, MD 05/17/17 [phone]

## 2017-05-15 NOTE — ED Triage Notes (Signed)
Per patient, states she fell 2 days ago and injured her left large toe nail-states nail is completely off now-states causing her a lot of issues

## 2018-01-29 ENCOUNTER — Emergency Department
Admission: EM | Admit: 2018-01-29 | Discharge: 2018-01-29 | Disposition: A | Discharge status: Home / Self Care | Payer: Self-pay | Attending: Emergency Medicine | Admitting: Emergency Medicine

## 2018-01-29 ENCOUNTER — Encounter: Payer: Self-pay | Admitting: Emergency Medicine

## 2018-01-29 ENCOUNTER — Other Ambulatory Visit: Payer: Self-pay

## 2018-01-29 DIAGNOSIS — N939 Abnormal uterine and vaginal bleeding, unspecified: Secondary | ICD-10-CM | POA: Insufficient documentation

## 2018-01-29 DIAGNOSIS — Z5321 Procedure and treatment not carried out due to patient leaving prior to being seen by health care provider: Secondary | ICD-10-CM | POA: Insufficient documentation

## 2018-01-29 LAB — CBC
HEMATOCRIT: 39.2 % (ref 35.0–47.0)
HEMOGLOBIN: 13.1 g/dL (ref 12.0–16.0)
MCH: 27.9 pg (ref 26.0–34.0)
MCHC: 33.3 g/dL (ref 32.0–36.0)
MCV: 83.8 fL (ref 80.0–100.0)
Platelets: 234 10*3/uL (ref 150–440)
RBC: 4.67 MIL/uL (ref 3.80–5.20)
RDW: 14.5 % (ref 11.5–14.5)
WBC: 9.2 10*3/uL (ref 3.6–11.0)

## 2018-01-29 LAB — BASIC METABOLIC PANEL
ANION GAP: 8 (ref 5–15)
BUN: 11 mg/dL (ref 6–20)
CHLORIDE: 107 mmol/L (ref 98–111)
CO2: 24 mmol/L (ref 22–32)
Calcium: 8.8 mg/dL — ABNORMAL LOW (ref 8.9–10.3)
Creatinine, Ser: 0.63 mg/dL (ref 0.44–1.00)
GFR calc non Af Amer: >60 mL/min (ref >60)
Glucose, Bld: 92 mg/dL (ref 70–99)
POTASSIUM: 3.9 mmol/L (ref 3.5–5.1)
SODIUM: 139 mmol/L (ref 135–145)

## 2018-01-29 LAB — POCT PREGNANCY, URINE: PREG TEST UR: NEGATIVE

## 2018-01-29 NOTE — ED Notes (Signed)
Pt states she needs to leave for work and she is not able to stay here to be seen. Dr. Darnelle CatalanMalinda advised and pt will sign out AMA.

## 2018-01-29 NOTE — ED Triage Notes (Signed)
Pt states spotting for the past 2 weeks, clotting the past few days. Vomiting this am, could possibly be pregnant. Appears in NAD.

## 2018-05-05 ENCOUNTER — Emergency Department (HOSPITAL_COMMUNITY)
Admission: EM | Admit: 2018-05-05 | Discharge: 2018-05-05 | Disposition: A | Discharge status: Home / Self Care | Payer: Self-pay | Attending: Emergency Medicine | Admitting: Emergency Medicine

## 2018-05-05 ENCOUNTER — Encounter (HOSPITAL_COMMUNITY): Payer: Self-pay | Admitting: Emergency Medicine

## 2018-05-05 ENCOUNTER — Other Ambulatory Visit: Payer: Self-pay

## 2018-05-05 DIAGNOSIS — L309 Dermatitis, unspecified: Secondary | ICD-10-CM

## 2018-05-05 DIAGNOSIS — Z87891 Personal history of nicotine dependence: Secondary | ICD-10-CM | POA: Insufficient documentation

## 2018-05-05 DIAGNOSIS — R079 Chest pain, unspecified: Secondary | ICD-10-CM | POA: Insufficient documentation

## 2018-05-05 DIAGNOSIS — F909 Attention-deficit hyperactivity disorder, unspecified type: Secondary | ICD-10-CM | POA: Insufficient documentation

## 2018-05-05 DIAGNOSIS — L259 Unspecified contact dermatitis, unspecified cause: Secondary | ICD-10-CM | POA: Insufficient documentation

## 2018-05-05 LAB — I-STAT TROPONIN, ED: Troponin i, poc: 0 ng/mL (ref 0.00–0.08)

## 2018-05-05 MED ORDER — METHYLPREDNISOLONE SODIUM SUCC 40 MG IJ SOLR
40.0000 mg | Freq: Once | INTRAMUSCULAR | Status: AC
  Administered 2018-05-05: 40 mg via INTRAMUSCULAR
  Filled 2018-05-05: qty 1

## 2018-05-05 MED ORDER — HYDROXYZINE HCL 25 MG PO TABS: 25.0000 mg | ORAL_TABLET | Freq: Four times a day (QID) | ORAL | 0 refills | Status: DC

## 2018-05-05 MED ORDER — HYDROXYZINE HCL 25 MG PO TABS
25.0000 mg | ORAL_TABLET | Freq: Once | ORAL | Status: AC
Start: 2018-05-05 — End: 2018-05-05
  Administered 2018-05-05: 25 mg via ORAL
  Filled 2018-05-05: qty 1

## 2018-05-05 MED ORDER — FAMOTIDINE 20 MG PO TABS: 20.0000 mg | ORAL_TABLET | Freq: Two times a day (BID) | ORAL | 0 refills | Status: DC

## 2018-05-05 MED ORDER — PREDNISONE 10 MG PO TABS: 40.0000 mg | ORAL_TABLET | Freq: Every day | ORAL | 0 refills | Status: DC

## 2018-05-05 MED ORDER — FAMOTIDINE 20 MG PO TABS
20.0000 mg | ORAL_TABLET | Freq: Once | ORAL | Status: AC
  Administered 2018-05-05: 20 mg via ORAL
  Filled 2018-05-05: qty 1

## 2018-05-05 NOTE — ED Provider Notes (Signed)
[facility] EMERGENCY DEPARTMENT Provider Note   CSN: [id number] Arrival date & time: 05/05/18  4098     History   Chief Complaint Chief Complaint  Patient presents with  . Rash  . Chest Pain    HPI Alyssa Ray is a 27 y.o. female who presents to the ED for a rash that started yesterday. Patient reports she has a lot of itching and the rash seems to be spreading. She denies new clothes, detergents, soaps, food or other contacts. No one else in the home has the rash. Patient also c/o chest pain that comes and goes for the past 24 hours. She describes the pain as sharp that comes and just last a second or 2 and then is gone. She denies any pain at this time. She has had a couple episodes.   The history is provided by the patient. No language interpreter was used.  Rash   This is a new problem. The current episode started yesterday. The problem is associated with nothing. There has been no fever. The patient is experiencing no pain. Treatments tried: steroid cream without improvement.    Past Medical History:  Diagnosis Date  . ADHD (attention deficit hyperactivity disorder)   . BV (bacterial vaginosis)   . Chlamydia     Patient Active Problem List   Diagnosis Date Noted  . ADHD (attention deficit hyperactivity disorder)   . Chlamydia     Past Surgical History:  Procedure Laterality Date  . CLEFT LIP REPAIR    . TONSILLECTOMY       OB History   None      Home Medications    Prior to Admission medications   Medication Sig Start Date End Date Taking? Authorizing Provider  dicyclomine (BENTYL) 20 MG tablet Take 1 tablet (20 mg total) by mouth 2 (two) times daily. 11/28/15   Rolland Porter, MD  famotidine (PEPCID) 20 MG tablet Take 1 tablet (20 mg total) by mouth 2 (two) times daily. 05/05/18   Janne Napoleon, NP  hydrOXYzine (ATARAX/VISTARIL) 25 MG tablet Take 1 tablet (25 mg total) by mouth every 6 (six) hours. 05/05/18   Janne Napoleon, NP    meloxicam (MOBIC) 15 MG tablet Take 1 tablet (15 mg total) by mouth daily. Take 1 daily with food. 05/15/17   Harris, Abigail, PA-C  ondansetron (ZOFRAN ODT) 4 MG disintegrating tablet Take 1 tablet (4 mg total) by mouth every 8 (eight) hours as needed for nausea. 11/28/15   Rolland Porter, MD  predniSONE (DELTASONE) 10 MG tablet Take 4 tablets (40 mg total) by mouth daily. 05/05/18   Janne Napoleon, NP    Family History History reviewed. No pertinent family history.  Social History Social History   Tobacco Use  . Smoking status: Former Games developer  . Smokeless tobacco: Never Used  Substance Use Topics  . Alcohol use: Yes    Comment: 4drinks/wk  . Drug use: Not Currently    Types: Marijuana     Allergies   Patient has no known allergies.   Review of Systems Review of Systems  Constitutional: Negative for chills and fever.  HENT: Negative.   Eyes: Negative for discharge, redness and itching.  Respiratory: Negative for cough and shortness of breath.   Cardiovascular: Positive for chest pain.  Gastrointestinal: Positive for nausea. Negative for abdominal pain and vomiting.  Musculoskeletal: Negative for joint swelling.  Skin: Positive for rash.  Allergic/Immunologic: Negative for environmental allergies and food allergies.  Neurological:  Negative for light-headedness and headaches.  Psychiatric/Behavioral: Negative for confusion.     Physical Exam Updated Vital Signs BP (!) 122/94 (BP Location: Right Arm)   Pulse 82   Temp 98.4 F (36.9 C) (Oral)   Resp (!) 24   Ht 5\' 4"  (1.626 m)   Wt 115.7 kg   LMP 04/25/2018   SpO2 98%   BMI 43.77 kg/m   Physical Exam  Constitutional: She is oriented to person, place, and time. She appears well-developed and well-nourished. No distress.  HENT:  Head: Normocephalic.  Mouth/Throat: Uvula is midline and oropharynx is clear and moist.  Eyes: Conjunctivae and EOM are normal.  Neck: Normal range of motion. Neck supple.   Cardiovascular: Normal rate and regular rhythm.  Pulmonary/Chest: Effort normal and breath sounds normal. No respiratory distress.  Abdominal: Soft. Bowel sounds are normal. There is no tenderness.  Musculoskeletal: Normal range of motion.  Lymphadenopathy:    She has no cervical adenopathy.  Neurological: She is alert and oriented to person, place, and time.  Skin:  There is a maculopapular rash to the abdomen, upper thighs and left side of neck that is pruritic.   Psychiatric: She has a normal mood and affect. Her behavior is normal.  Nursing note and vitals reviewed.    ED Treatments / Results  Labs (all labs ordered are listed, but only abnormal results are displayed) Labs Reviewed  I-STAT TROPONIN, ED    EKG None  Radiology No results found.  Procedures Procedures (including critical care time)  Medications Ordered in ED Medications  hydrOXYzine (ATARAX/VISTARIL) tablet 25 mg (25 mg Oral Given 05/05/18 1026)  methylPREDNISolone sodium succinate (SOLU-MEDROL) 40 mg/mL injection 40 mg (40 mg Intramuscular Given 05/05/18 1027)  famotidine (PEPCID) tablet 20 mg (20 mg Oral Given 05/05/18 1027)     Initial Impression / Assessment and Plan / ED Course  I have reviewed the triage vital signs and the nursing notes. 27 y.o. female here with rash that started yesterday and occasional sharp pain in her chest stable for d/c without respiratory distress, difficulty swallowing and appears stable. EKG and troponin normal. Discussed with the patient f/u with dermatology if rash persist and with PCP for chest symptoms. Patient agrees with plan.   Final Clinical Impressions(s) / ED Diagnoses   Final diagnoses:  Dermatitis  Nonspecific chest pain    ED Discharge Orders         Ordered    hydrOXYzine (ATARAX/VISTARIL) 25 MG tablet  Every 6 hours     05/05/18 1100    famotidine (PEPCID) 20 MG tablet  2 times daily     05/05/18 1100    predniSONE (DELTASONE) 10 MG tablet   Daily     05/05/18 1100           Damian Leavelleese, AnnadaHope M, NP 05/05/18 1108    Gerhard MunchLockwood, Robert, MD 05/06/18 1224

## 2018-05-05 NOTE — ED Notes (Signed)
ED Provider at bedside. 

## 2018-05-05 NOTE — ED Triage Notes (Signed)
Pt reports rash to abdomen, back and thighs for the last 2 days. Also reports central chest tightness since yesterday. Appears anxious, speaking in full sentences. VSS.

## 2018-05-05 NOTE — Discharge Instructions (Addendum)
The medication for itching can make you sleepy so do not drive while taking it. Follow up with Dr. Scharlene GlossHall's office if the rash does not improve with the medication. Your EKG and blood work concerning your heart today are normal. Follow up with your primary care doctor if the symptoms continue. Return here as needed.

## 2018-05-21 ENCOUNTER — Emergency Department (HOSPITAL_COMMUNITY): Payer: Self-pay

## 2018-05-21 ENCOUNTER — Other Ambulatory Visit: Payer: Self-pay

## 2018-05-21 ENCOUNTER — Encounter (HOSPITAL_COMMUNITY): Payer: Self-pay

## 2018-05-21 ENCOUNTER — Emergency Department (HOSPITAL_COMMUNITY)
Admission: EM | Admit: 2018-05-21 | Discharge: 2018-05-22 | Disposition: A | Discharge status: Home / Self Care | Payer: Self-pay | Attending: Emergency Medicine | Admitting: Emergency Medicine

## 2018-05-21 DIAGNOSIS — Z87891 Personal history of nicotine dependence: Secondary | ICD-10-CM | POA: Insufficient documentation

## 2018-05-21 DIAGNOSIS — R1012 Left upper quadrant pain: Secondary | ICD-10-CM | POA: Insufficient documentation

## 2018-05-21 DIAGNOSIS — F909 Attention-deficit hyperactivity disorder, unspecified type: Secondary | ICD-10-CM | POA: Insufficient documentation

## 2018-05-21 DIAGNOSIS — Z79899 Other long term (current) drug therapy: Secondary | ICD-10-CM | POA: Insufficient documentation

## 2018-05-21 LAB — CBC
HEMATOCRIT: 42.2 % (ref 36.0–46.0)
HEMOGLOBIN: 12.8 g/dL (ref 12.0–15.0)
MCH: 27.1 pg (ref 26.0–34.0)
MCHC: 30.3 g/dL (ref 30.0–36.0)
MCV: 89.2 fL (ref 80.0–100.0)
Platelets: 286 10*3/uL (ref 150–400)
RBC: 4.73 MIL/uL (ref 3.87–5.11)
RDW: 13.4 % (ref 11.5–15.5)
WBC: 14.8 10*3/uL — AB (ref 4.0–10.5)
nRBC: 0 % (ref 0.0–0.2)

## 2018-05-21 LAB — COMPREHENSIVE METABOLIC PANEL
ALBUMIN: 3.9 g/dL (ref 3.5–5.0)
ALT: 20 U/L (ref 0–44)
ANION GAP: 10 (ref 5–15)
AST: 17 U/L (ref 15–41)
Alkaline Phosphatase: 70 U/L (ref 38–126)
BILIRUBIN TOTAL: 0.5 mg/dL (ref 0.3–1.2)
BUN: 10 mg/dL (ref 6–20)
CHLORIDE: 102 mmol/L (ref 98–111)
CO2: 24 mmol/L (ref 22–32)
Calcium: 9.4 mg/dL (ref 8.9–10.3)
Creatinine, Ser: 0.65 mg/dL (ref 0.44–1.00)
GFR calc Af Amer: >60 mL/min (ref >60)
GFR calc non Af Amer: >60 mL/min (ref >60)
Glucose, Bld: 99 mg/dL (ref 70–99)
POTASSIUM: 3.9 mmol/L (ref 3.5–5.1)
Sodium: 136 mmol/L (ref 135–145)
Total Protein: 7.5 g/dL (ref 6.5–8.1)

## 2018-05-21 LAB — URINALYSIS, ROUTINE W REFLEX MICROSCOPIC
Bilirubin Urine: NEGATIVE
Glucose, UA: NEGATIVE mg/dL
Hgb urine dipstick: NEGATIVE
Ketones, ur: NEGATIVE mg/dL
Leukocytes, UA: NEGATIVE
NITRITE: NEGATIVE
PH: 5 (ref 5.0–8.0)
Protein, ur: NEGATIVE mg/dL
SPECIFIC GRAVITY, URINE: 1.015 (ref 1.005–1.030)

## 2018-05-21 LAB — I-STAT BETA HCG BLOOD, ED (MC, WL, AP ONLY)

## 2018-05-21 LAB — LIPASE, BLOOD: LIPASE: 34 U/L (ref 11–51)

## 2018-05-21 MED ORDER — PANTOPRAZOLE SODIUM 40 MG PO TBEC
40.0000 mg | DELAYED_RELEASE_TABLET | Freq: Once | ORAL | Status: AC
  Administered 2018-05-22: 40 mg via ORAL
  Filled 2018-05-21: qty 1

## 2018-05-21 MED ORDER — IOHEXOL 300 MG/ML  SOLN
100.0000 mL | Freq: Once | INTRAMUSCULAR | Status: AC | PRN
  Administered 2018-05-21: 100 mL via INTRAVENOUS

## 2018-05-21 MED ORDER — ONDANSETRON HCL 4 MG/2ML IJ SOLN
4.0000 mg | Freq: Once | INTRAMUSCULAR | Status: AC
  Administered 2018-05-21: 4 mg via INTRAVENOUS
  Filled 2018-05-21: qty 2

## 2018-05-21 MED ORDER — MORPHINE SULFATE (PF) 4 MG/ML IV SOLN
4.0000 mg | Freq: Once | INTRAVENOUS | Status: AC
Start: 2018-05-21 — End: 2018-05-21
  Administered 2018-05-21: 4 mg via INTRAVENOUS
  Filled 2018-05-21: qty 1

## 2018-05-21 NOTE — ED Provider Notes (Signed)
[facility] EMERGENCY DEPARTMENT Provider Note   CSN: [id number] Arrival date & time: 05/21/18  2004     History   Chief Complaint Chief Complaint  Patient presents with  . Abdominal Pain    HPI Alyssa Ray is a 27 y.o. female with a hx of ADHD, BV, chlamydia, cleft lip presents to the Emergency Department complaining of gradual, persistent, progressively worsening generalized abdominal pain onset 4 days ago.  She reports over the last 4 days pain is begun to migrate more to the left upper quadrant.  She reports the pain is rated at a 7/10, sharp and stabbing.  It radiates into her left flank.  She reports development of nausea and vomiting this morning.  She reports numerous incidents of nonbloody nonbilious emesis now with dry heaving.  She reports last bowel movement was this morning and "normal." Patient reports she has 1 bowel movement per day.  She denies melena or hematochezia.  Additionally, patient denies fever, chills, headache, neck pain, chest pain, shortness of breath, diarrhea, weakness, dizziness, syncope, dysuria, hematuria, urinary frequency, vaginal discharge.  Patient report she is sexually active but is not concerned about an STD.  Patient denies previous abdominal surgeries but does report history of intussusception as a child that did not require surgery.  She denies known sick contacts or international travel.  She reports she drinks socially but has not had anything in more than 1 week.  She does not drink daily.  She denies regular NSAID usage or history of gastritis.  She has no history of peptic ulcer.   The history is provided by the patient and medical records. No language interpreter was used.    Past Medical History:  Diagnosis Date  . ADHD (attention deficit hyperactivity disorder)   . BV (bacterial vaginosis)   . Chlamydia     Patient Active Problem List   Diagnosis Date Noted  . ADHD (attention deficit hyperactivity disorder)   .  Chlamydia     Past Surgical History:  Procedure Laterality Date  . CLEFT LIP REPAIR    . TONSILLECTOMY       OB History   None      Home Medications    Prior to Admission medications   Medication Sig Start Date End Date Taking? Authorizing Provider  dicyclomine (BENTYL) 20 MG tablet Take 1 tablet (20 mg total) by mouth 2 (two) times daily. 11/28/15   Rolland Porter, MD  famotidine (PEPCID) 20 MG tablet Take 1 tablet (20 mg total) by mouth 2 (two) times daily. 05/05/18   Janne Napoleon, NP  hydrOXYzine (ATARAX/VISTARIL) 25 MG tablet Take 1 tablet (25 mg total) by mouth every 6 (six) hours. 05/05/18   Janne Napoleon, NP  meloxicam (MOBIC) 15 MG tablet Take 1 tablet (15 mg total) by mouth daily. Take 1 daily with food. 05/15/17   Arthor Captain, PA-C  ondansetron (ZOFRAN ODT) 4 MG disintegrating tablet 4mg  ODT q4 hours prn nausea/vomit 05/22/18   Beuna Bolding, Dahlia Client, PA-C  pantoprazole (PROTONIX) 20 MG tablet Take 1 tablet (20 mg total) by mouth daily. 05/22/18   Tabrina Esty, Dahlia Client, PA-C  predniSONE (DELTASONE) 10 MG tablet Take 4 tablets (40 mg total) by mouth daily. 05/05/18   Janne Napoleon, NP    Family History History reviewed. No pertinent family history.  Social History Social History   Tobacco Use  . Smoking status: Former Games developer  . Smokeless tobacco: Never Used  Substance Use Topics  . Alcohol use: Yes  Comment: 4drinks/wk  . Drug use: Not Currently    Types: Marijuana     Allergies   Patient has no known allergies.   Review of Systems Review of Systems  Constitutional: Negative for appetite change, diaphoresis, fatigue, fever and unexpected weight change.  HENT: Negative for mouth sores.   Eyes: Negative for visual disturbance.  Respiratory: Negative for cough, chest tightness, shortness of breath and wheezing.   Cardiovascular: Negative for chest pain.  Gastrointestinal: Positive for abdominal pain, nausea and vomiting. Negative for constipation and  diarrhea.  Endocrine: Negative for polydipsia, polyphagia and polyuria.  Genitourinary: Negative for dysuria, frequency, hematuria and urgency.  Musculoskeletal: Negative for back pain and neck stiffness.  Skin: Negative for rash.  Allergic/Immunologic: Negative for immunocompromised state.  Neurological: Negative for syncope, light-headedness and headaches.  Hematological: Does not bruise/bleed easily.  Psychiatric/Behavioral: Negative for sleep disturbance. The patient is not nervous/anxious.      Physical Exam Updated Vital Signs BP 139/89   Pulse 70   Temp 98.5 F (36.9 C) (Oral)   Resp 14   LMP 04/25/2018   SpO2 99%   Physical Exam  Constitutional: She appears well-developed and well-nourished. No distress.  Awake, alert, uncomfortable appearing  HENT:  Head: Normocephalic and atraumatic.  Mouth/Throat: Oropharynx is clear and moist. No oropharyngeal exudate.  Eyes: Conjunctivae are normal. No scleral icterus.  Neck: Normal range of motion. Neck supple.  Cardiovascular: Normal rate, regular rhythm and intact distal pulses.  Pulmonary/Chest: Effort normal and breath sounds normal. No respiratory distress. She has no wheezes.  Equal chest expansion  Abdominal: Soft. Bowel sounds are normal. She exhibits no mass. There is generalized tenderness and tenderness in the left upper quadrant. There is guarding ( Left upper quadrant only). There is no rigidity, no rebound and no CVA tenderness.  Exam limited by body habitus  Musculoskeletal: Normal range of motion. She exhibits no edema.  Neurological: She is alert.  Speech is clear and goal oriented Moves extremities without ataxia  Skin: Skin is warm and dry. She is not diaphoretic.  Psychiatric: She has a normal mood and affect.  Nursing note and vitals reviewed.    ED Treatments / Results  Labs (all labs ordered are listed, but only abnormal results are displayed) Labs Reviewed  CBC - Abnormal; Notable for the  following components:      Result Value   WBC 14.8 (*)    All other components within normal limits  LIPASE, BLOOD  COMPREHENSIVE METABOLIC PANEL  URINALYSIS, ROUTINE W REFLEX MICROSCOPIC  I-STAT BETA HCG BLOOD, ED ([facility], [facility], [facility] ONLY)    Radiology Ct Abdomen Pelvis W Contrast  Result Date: 05/21/2018 CLINICAL DATA:  Initial evaluation for acute nausea, vomiting, generalized abdominal pain. EXAM: CT ABDOMEN AND PELVIS WITH CONTRAST TECHNIQUE: Multidetector CT imaging of the abdomen and pelvis was performed using the standard protocol following bolus administration of intravenous contrast. CONTRAST:  100mL OMNIPAQUE IOHEXOL 300 MG/ML  SOLN COMPARISON:  Prior CT from 12/06/2015. FINDINGS: Lower chest: Visualized lung bases are clear. Hepatobiliary: Liver demonstrates a normal contrast enhanced appearance. Gallbladder within normal limits. No biliary dilatation. Pancreas: Pancreas within normal limits. Spleen: Spleen within normal limits. Adrenals/Urinary Tract: Adrenal glands are normal. Kidneys equal in size with symmetric enhancement. No nephrolithiasis, hydronephrosis, or focal enhancing renal mass. No hydroureter. Partially distended bladder within normal limits. Stomach/Bowel: Stomach within normal limits. No evidence for bowel obstruction. No abnormal wall thickening, mucosal enhancement, or inflammatory fat stranding seen about the bowels. No  findings to suggest acute appendicitis. Vascular/Lymphatic: Normal intravascular enhancement seen throughout the intra-abdominal aorta. No aneurysm. Mesenteric vessels patent proximally. No adenopathy. Reproductive: Uterus and ovaries within normal limits for age. Other: No free air or fluid. Musculoskeletal: No acute osseus abnormality. No worrisome lytic or blastic osseous lesions. Thoracolumbar scoliosis noted. IMPRESSION: No CT evidence for acute intra-abdominal or pelvic process. Electronically Signed   By: Rise Mu M.D.   On: 05/21/2018 23:20     Procedures Procedures (including critical care time)  Medications Ordered in ED Medications  morphine 4 MG/ML injection 4 mg (4 mg Intravenous Given 05/21/18 2248)  ondansetron (ZOFRAN) injection 4 mg (4 mg Intravenous Given 05/21/18 2245)  iohexol (OMNIPAQUE) 300 MG/ML solution 100 mL (100 mLs Intravenous Contrast Given 05/21/18 2252)  pantoprazole (PROTONIX) EC tablet 40 mg (40 mg Oral Given 05/22/18 0016)  promethazine (PHENERGAN) injection 25 mg (25 mg Intravenous Given 05/22/18 0046)  fentaNYL (SUBLIMAZE) injection 50 mcg (50 mcg Intravenous Given 05/22/18 0046)     Initial Impression / Assessment and Plan / ED Course  I have reviewed the triage vital signs and the nursing notes.  Pertinent labs & imaging results that were available during my care of the patient were reviewed by me and considered in my medical decision making (see chart for details).  Clinical Course as of May 22 236  Thu May 21, 2018  2239 Leukocytosis noted.  WBC(!): 14.8 [HM]  2240 No evidence of urinary tract infection.  No hemoglobin in the urine.  Leukocytes, UA: NEGATIVE [HM]  2240 Electrolytes are within normal limits.  Sodium: 136 [HM]  2240 Negative pregnancy test.  I-stat hCG, quantitative: <5.0 [HM]  2240 Afebrile without tachycardia or hypotension.  Temp: 98.5 F (36.9 C) [HM]  Fri May 22, 2018  0026 Pt continues to have complaints of severe pain.  She is vomiting in the room again.  Will give fentanyl and phenergan.     [HM]  0234 No additional vomiting here in the emergency department.  On repeat exam abdomen is soft and nontender.   [HM]    Clinical Course User Index [HM] Monterrius Cardosa, Boyd Kerbs    Presents with generalized and left upper quadrant abdominal pain.  Labs are reassuring.  She does have a leukocytosis and guards in the left upper quadrant on exam.  Will obtain CT scan, give pain control and nausea medication.  2:37 AM She is now feeling much better.  She is requesting  discharge home.  Her pain is well controlled.  She has tolerated p.o. without difficulty and has had no additional vomiting here in the emergency department.  CT abdomen without acute abnormalities.  I personally evaluated these images.  Suspect gastritis.  Discussed gastritis precautions including no alcohol or NSAIDs.  Will give Protonix and Zofran.  She is to follow with primary care and GI if symptoms persist.  Patient states understanding and is in agreement with the plan.  Final Clinical Impressions(s) / ED Diagnoses   Final diagnoses:  Left upper quadrant pain    ED Discharge Orders         Ordered    pantoprazole (PROTONIX) 20 MG tablet  Daily     05/22/18 0235    ondansetron (ZOFRAN ODT) 4 MG disintegrating tablet     05/22/18 0236           Almin Livingstone, Dahlia Client, PA-C 05/22/18 1610    Derwood Kaplan, MD 05/23/18 [phone]

## 2018-05-21 NOTE — ED Notes (Signed)
Patient transported to CT 

## 2018-05-21 NOTE — ED Triage Notes (Signed)
Pt here for central abdominal pain, pain has been present the last week on and off. No nausea or vomiting, just does not want to eat.  A&Ox4

## 2018-05-21 NOTE — ED Notes (Signed)
Patient transported to X-ray 

## 2018-05-22 MED ORDER — FENTANYL CITRATE (PF) 100 MCG/2ML IJ SOLN
50.0000 ug | Freq: Once | INTRAMUSCULAR | Status: AC
  Administered 2018-05-22: 50 ug via INTRAVENOUS
  Filled 2018-05-22: qty 2

## 2018-05-22 MED ORDER — PANTOPRAZOLE SODIUM 20 MG PO TBEC: 20.0000 mg | DELAYED_RELEASE_TABLET | Freq: Every day | ORAL | 0 refills | Status: DC

## 2018-05-22 MED ORDER — ONDANSETRON 4 MG PO TBDP: ORAL_TABLET | ORAL | 0 refills | Status: DC

## 2018-05-22 MED ORDER — PROMETHAZINE HCL 25 MG/ML IJ SOLN
25.0000 mg | Freq: Once | INTRAMUSCULAR | Status: AC
  Administered 2018-05-22: 25 mg via INTRAVENOUS
  Filled 2018-05-22: qty 1

## 2018-05-22 NOTE — ED Notes (Signed)
Discharge paperwork explained and reviewed. Pt verbalized understanding of all discharge paperwork. Unable to use signature pad due to it being unavailable.

## 2018-05-22 NOTE — Discharge Instructions (Signed)
1. Medications: zofran, protonix, usual home medications 2. Treatment: rest, drink plenty of fluids, advance diet slowly 3. Follow Up: Please followup with your primary doctor in 2 days for discussion of your diagnoses and further evaluation after today's visit; if you do not have a primary care doctor use the resource guide provided to find one; Please return to the ER for persistent vomiting, high fevers or worsening symptoms  

## 2018-05-22 NOTE — ED Notes (Signed)
Pt reports she just vomited.

## 2018-10-05 ENCOUNTER — Emergency Department (HOSPITAL_COMMUNITY): Payer: Self-pay

## 2018-10-05 ENCOUNTER — Emergency Department (HOSPITAL_COMMUNITY)
Admission: EM | Admit: 2018-10-05 | Discharge: 2018-10-06 | Disposition: A | Discharge status: Home / Self Care | Payer: Self-pay | Attending: Emergency Medicine | Admitting: Emergency Medicine

## 2018-10-05 ENCOUNTER — Encounter (HOSPITAL_COMMUNITY): Payer: Self-pay | Admitting: Emergency Medicine

## 2018-10-05 ENCOUNTER — Other Ambulatory Visit: Payer: Self-pay

## 2018-10-05 DIAGNOSIS — M62838 Other muscle spasm: Secondary | ICD-10-CM

## 2018-10-05 DIAGNOSIS — Z87891 Personal history of nicotine dependence: Secondary | ICD-10-CM | POA: Insufficient documentation

## 2018-10-05 DIAGNOSIS — R202 Paresthesia of skin: Secondary | ICD-10-CM

## 2018-10-05 DIAGNOSIS — R079 Chest pain, unspecified: Secondary | ICD-10-CM

## 2018-10-05 LAB — BASIC METABOLIC PANEL
Anion gap: 9 (ref 5–15)
BUN: 12 mg/dL (ref 6–20)
CO2: 25 mmol/L (ref 22–32)
Calcium: 9 mg/dL (ref 8.9–10.3)
Chloride: 104 mmol/L (ref 98–111)
Creatinine, Ser: 0.89 mg/dL (ref 0.44–1.00)
GFR calc Af Amer: >60 mL/min (ref >60)
GFR calc non Af Amer: >60 mL/min (ref >60)
Glucose, Bld: 99 mg/dL (ref 70–99)
Potassium: 3.7 mmol/L (ref 3.5–5.1)
Sodium: 138 mmol/L (ref 135–145)

## 2018-10-05 LAB — CBC
HCT: 40.4 % (ref 36.0–46.0)
Hemoglobin: 12.2 g/dL (ref 12.0–15.0)
MCH: 26.5 pg (ref 26.0–34.0)
MCHC: 30.2 g/dL (ref 30.0–36.0)
MCV: 87.8 fL (ref 80.0–100.0)
Platelets: 283 10*3/uL (ref 150–400)
RBC: 4.6 MIL/uL (ref 3.87–5.11)
RDW: 14.5 % (ref 11.5–15.5)
WBC: 11.9 10*3/uL — ABNORMAL HIGH (ref 4.0–10.5)
nRBC: 0 % (ref 0.0–0.2)

## 2018-10-05 LAB — I-STAT BETA HCG BLOOD, ED (MC, WL, AP ONLY): I-stat hCG, quantitative: <5 m[IU]/mL (ref <5)

## 2018-10-05 LAB — TROPONIN I: Troponin I: <0.03 ng/mL (ref <0.03)

## 2018-10-05 MED ORDER — LORAZEPAM 2 MG/ML IJ SOLN
1.0000 mg | Freq: Once | INTRAMUSCULAR | Status: AC
  Administered 2018-10-06: 01:00:00 1 mg via INTRAVENOUS
  Filled 2018-10-05: qty 1

## 2018-10-05 MED ORDER — SODIUM CHLORIDE 0.9% FLUSH: 3.0000 mL | Freq: Once | INTRAVENOUS | Status: DC

## 2018-10-05 NOTE — ED Provider Notes (Signed)
[facility] Provider [facility] MRN:  [id number]  Arrival date & time: 10/06/18     Chief Complaint   Chest Pain   History of Present Illness   Alyssa Ray is a 28 y.o. year-old female with no pertinent medical history presenting to the ED with chief complaint of chest pain.  Sudden onset right-sided chest pain that began 6 to 10 hours ago.  Pain is constant, moderate to severe, worse with deep breaths.  Associated with shortness of breath.  Denies dizziness or diaphoresis, mild nausea but no vomiting.  Denies any new leg pain or swelling, does not take contraceptives, no recent trips.  Denies cough or fever.  Also endorsing paresthesias to the right hand, persistent for the past several hours.  Right hand feels cold compared to the right.  Feels like it is asleep, feels like she has been sitting on it.  Denies any repetitive motions or activities with the right hand.  Review of Systems  A complete 10 system review of systems was obtained and all systems are negative except as noted in the HPI and PMH.   Patient's Health History    Past Medical History:  Diagnosis Date  . ADHD (attention deficit hyperactivity disorder)   . BV (bacterial vaginosis)   . Chlamydia     Past Surgical History:  Procedure Laterality Date  . CLEFT LIP REPAIR    . TONSILLECTOMY      No family history on file.  Social History   Socioeconomic History  . Marital status: Single    Spouse name: Not on file  . Number of children: Not on file  . Years of education: Not on file  . Highest education level: Not on file  Occupational History  . Not on file  Social Needs  . Financial resource strain: Not on file  . Food insecurity:    Worry: Not on file    Inability: Not on file  . Transportation needs:    Medical: Not on file    Non-medical: Not on file  Tobacco Use  . Smoking status: Former Games developermoker  . Smokeless tobacco: Never Used  Substance and Sexual Activity  .  Alcohol use: Yes    Comment: 4drinks/wk  . Drug use: Not Currently    Types: Marijuana  . Sexual activity: Yes    Birth control/protection: None  Lifestyle  . Physical activity:    Days per week: Not on file    Minutes per session: Not on file  . Stress: Not on file  Relationships  . Social connections:    Talks on phone: Not on file    Gets together: Not on file    Attends religious service: Not on file    Active member of club or organization: Not on file    Attends meetings of clubs or organizations: Not on file    Relationship status: Not on file  . Intimate partner violence:    Fear of current or ex partner: Not on file    Emotionally abused: Not on file    Physically abused: Not on file    Forced sexual activity: Not on file  Other Topics Concern  . Not on file  Social History Narrative  . Not on file     Physical Exam  Vital Signs and Nursing Notes reviewed Vitals:   10/05/18 2245 10/05/18 2300  BP:  118/69  Pulse: 89 84  Resp: 17 17  Temp:    SpO2: 100%  98%    CONSTITUTIONAL:  well-appearing, NAD NEURO:  Alert and oriented x 3, normal and symmetric strength, subjective decreased sensation to the right hand EYES:  eyes equal and reactive ENT/NECK:  no LAD, no JVD CARDIO:  regular rate, well-perfused, normal S1 and S2 PULM:  CTAB no wheezing or rhonchi GI/GU:  normal bowel sounds, non-distended, non-tender MSK/SPINE:  No gross deformities, no edema SKIN:  no rash, atraumatic PSYCH:  Appropriate speech and behavior  Diagnostic and Interventional Summary    EKG Interpretation  Date/Time:  Monday October 05 2018 18:04:31 EDT Ventricular Rate:  78 PR Interval:  132 QRS Duration: 80 QT Interval:  382 QTC Calculation: 435 R Axis:   55 Text Interpretation:  Normal sinus rhythm with sinus arrhythmia Possible Anterior infarct , age undetermined Abnormal ECG Confirmed by Alyssa Ray [phone]) on 10/05/2018 10:38:51 PM      Labs Reviewed  CBC - Abnormal;  Notable for the following components:      Result Value   WBC 11.9 (*)    All other components within normal limits  BASIC METABOLIC PANEL  TROPONIN I  I-STAT BETA HCG BLOOD, ED ([facility], [facility], [facility] ONLY)    DG Chest 2 View  Final Result    CT ANGIO CHEST PE W OR WO CONTRAST    (Results Pending)  CT Head Wo Contrast    (Results Pending)    Medications  sodium chloride flush (NS) 0.9 % injection 3 mL (has no administration in time range)  LORazepam (ATIVAN) injection 1 mg (has no administration in time range)     Procedures Critical Care  ED Course and Medical Decision Making  I have reviewed the triage vital signs and the nursing notes.  Pertinent labs & imaging results that were available during my care of the patient were reviewed by me and considered in my medical decision making (see below for details).  CT to exclude PE or vascular abnormality to explain patient's right shoulder pain with right hand paresthesia/subjective decreased sensation.  EKG is reassuring, troponin is negative.  Pain is pleuritic and atypical and patient has little to no cardiac risk factors.  If patient has a normal CT head, and continues to have absence of objective neurological deficits, the concern for central cause of patient's paresthesias is very low.  With normal CTA imaging as well, patient can be discharged with the likely diagnosis of muscle strain with peripheral neuropathy.  Signed out to Dr. Ilda Ray at shift change.  Alyssa Sow. Pilar Plate, MD [facility] Emergency Medicine [facility] [email or web address]  Final Clinical Impressions(s) / ED Diagnoses     ICD-10-CM   1. Chest pain, unspecified type R07.9   2. Paresthesia R20.2     ED Discharge Orders    None         Alyssa Sous, MD 10/06/18 [phone]

## 2018-10-05 NOTE — ED Triage Notes (Signed)
Rt sided cp and hurts to take a deep breath hurts in her shoulder blades feels like a pressure, nausea no diarrhea

## 2018-10-05 NOTE — ED Notes (Signed)
RN attempted to gain IV access with no success; IV team consult placed-Monique,RN

## 2018-10-06 ENCOUNTER — Emergency Department (HOSPITAL_COMMUNITY): Payer: Self-pay

## 2018-10-06 MED ORDER — KETOROLAC TROMETHAMINE 15 MG/ML IJ SOLN: 15.0000 mg | Freq: Once | INTRAMUSCULAR | Status: DC

## 2018-10-06 MED ORDER — IOHEXOL 350 MG/ML SOLN
100.0000 mL | Freq: Once | INTRAVENOUS | Status: AC | PRN
  Administered 2018-10-06: 01:00:00 100 mL via INTRAVENOUS

## 2018-10-06 MED ORDER — ONDANSETRON 4 MG PO TBDP
4.0000 mg | ORAL_TABLET | Freq: Once | ORAL | Status: AC
  Administered 2018-10-06: 03:00:00 4 mg via ORAL
  Filled 2018-10-06: qty 1

## 2018-10-06 MED ORDER — ONDANSETRON HCL 4 MG/2ML IJ SOLN: 4.0000 mg | Freq: Once | INTRAMUSCULAR | Status: DC

## 2018-10-06 NOTE — ED Notes (Signed)
ED Provider at bedside. 

## 2018-10-06 NOTE — ED Notes (Signed)
Patient transported to CT 

## 2018-10-06 NOTE — ED Notes (Signed)
E-signature not available, verbalized understanding of DC instructions. 

## 2018-10-06 NOTE — Discharge Instructions (Signed)
You may use over-the-counter Motrin (Ibuprofen), Acetaminophen (Tylenol), topical muscle creams such as SalonPas, Icy Hot, Bengay, etc. Please stretch, apply heat, and have massage therapy for additional assistance. ° °

## 2018-10-06 NOTE — ED Notes (Signed)
IV team at bedside 

## 2018-10-06 NOTE — ED Provider Notes (Signed)
I assumed care of this patient from Dr. Pilar Plate at 0000.  Please see their note for further details of Hx, PE.  Briefly patient is a 28 y.o. female who presented with right-sided chest pain with paresthesias to the right arm.  EKG was reassuring.  Plan for CT angios to rule out PE.  CT head to rule out masses..    CTA negative for PE.  CTA reassuring without masses.  I evaluated the patient and patient has significant tenderness to palpation on the right anterior chest wall and shoulder girdle muscles.  Palpating shoulder girdle muscles, especially the parascapular muscles reproduce the patient's right upper extremity paresthesia.  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  Pulses intact.  Patient given Toradol for pain.  Recommended supportive/symptomatic management.  The patient appears reasonably screened and/or stabilized for discharge and I doubt any other medical condition or other [facility] requiring further screening, evaluation, or treatment in the ED at this time prior to discharge.  The patient is safe for discharge with strict return precautions.   Disposition: Discharge  Condition: Good  I have discussed the results, Dx and Tx plan with the patient who expressed understanding and agree(s) with the plan. Discharge instructions discussed at great length. The patient was given strict return precautions who verbalized understanding of the instructions. No further questions at time of discharge.    ED Discharge Orders    None       Follow Up: Primary care provider   If you do not have a primary care physician, contact [facility] at [phone] for referral        Cardama, Amadeo Garnet, MD 10/06/18 0234

## 2019-05-17 ENCOUNTER — Encounter (HOSPITAL_COMMUNITY): Payer: Self-pay

## 2019-05-17 ENCOUNTER — Other Ambulatory Visit: Payer: Self-pay

## 2019-05-17 DIAGNOSIS — R1032 Left lower quadrant pain: Secondary | ICD-10-CM | POA: Insufficient documentation

## 2019-05-17 DIAGNOSIS — R110 Nausea: Secondary | ICD-10-CM | POA: Insufficient documentation

## 2019-05-17 DIAGNOSIS — Z87891 Personal history of nicotine dependence: Secondary | ICD-10-CM | POA: Insufficient documentation

## 2019-05-17 DIAGNOSIS — R102 Pelvic and perineal pain: Secondary | ICD-10-CM | POA: Insufficient documentation

## 2019-05-17 LAB — URINALYSIS, ROUTINE W REFLEX MICROSCOPIC
Bilirubin Urine: NEGATIVE
Glucose, UA: NEGATIVE mg/dL
Hgb urine dipstick: NEGATIVE
Ketones, ur: NEGATIVE mg/dL
Nitrite: NEGATIVE
Protein, ur: NEGATIVE mg/dL
Specific Gravity, Urine: 1.014 (ref 1.005–1.030)
pH: 7 (ref 5.0–8.0)

## 2019-05-17 MED ORDER — ONDANSETRON 4 MG PO TBDP
4.0000 mg | ORAL_TABLET | Freq: Once | ORAL | Status: AC
  Administered 2019-05-17: 4 mg via ORAL
  Filled 2019-05-17: qty 1

## 2019-05-17 NOTE — ED Notes (Signed)
Attempted blood draw x1 Unsuccessful Pt states she is a hard stick

## 2019-05-17 NOTE — ED Triage Notes (Signed)
Arrived POV with lower abdominal pain/pelvic pain that started this morning. Patient also reports nausea w/o vomiting. LMP November 15, but it was light and lasted one day

## 2019-05-18 ENCOUNTER — Emergency Department (HOSPITAL_COMMUNITY)
Admission: EM | Admit: 2019-05-18 | Discharge: 2019-05-18 | Disposition: A | Discharge status: Home / Self Care | Payer: Self-pay | Attending: Emergency Medicine | Admitting: Emergency Medicine

## 2019-05-18 ENCOUNTER — Emergency Department (HOSPITAL_COMMUNITY): Payer: Self-pay

## 2019-05-18 ENCOUNTER — Encounter (HOSPITAL_COMMUNITY): Payer: Self-pay

## 2019-05-18 DIAGNOSIS — R102 Pelvic and perineal pain: Secondary | ICD-10-CM

## 2019-05-18 LAB — LIPASE, BLOOD: Lipase: 25 U/L (ref 11–51)

## 2019-05-18 LAB — COMPREHENSIVE METABOLIC PANEL
ALT: 27 U/L (ref 0–44)
AST: 19 U/L (ref 15–41)
Albumin: 3.9 g/dL (ref 3.5–5.0)
Alkaline Phosphatase: 65 U/L (ref 38–126)
Anion gap: 9 (ref 5–15)
BUN: 10 mg/dL (ref 6–20)
CO2: 24 mmol/L (ref 22–32)
Calcium: 9.3 mg/dL (ref 8.9–10.3)
Chloride: 108 mmol/L (ref 98–111)
Creatinine, Ser: 0.66 mg/dL (ref 0.44–1.00)
GFR calc Af Amer: >60 mL/min (ref >60)
GFR calc non Af Amer: >60 mL/min (ref >60)
Glucose, Bld: 98 mg/dL (ref 70–99)
Potassium: 3.8 mmol/L (ref 3.5–5.1)
Sodium: 141 mmol/L (ref 135–145)
Total Bilirubin: 0.6 mg/dL (ref 0.3–1.2)
Total Protein: 7.4 g/dL (ref 6.5–8.1)

## 2019-05-18 LAB — CBC WITH DIFFERENTIAL/PLATELET
Abs Immature Granulocytes: 0.08 10*3/uL — ABNORMAL HIGH (ref 0.00–0.07)
Basophils Absolute: 0.1 10*3/uL (ref 0.0–0.1)
Basophils Relative: 1 %
Eosinophils Absolute: 0.5 10*3/uL (ref 0.0–0.5)
Eosinophils Relative: 4 %
HCT: 40.4 % (ref 36.0–46.0)
Hemoglobin: 12.6 g/dL (ref 12.0–15.0)
Immature Granulocytes: 1 %
Lymphocytes Relative: 32 %
Lymphs Abs: 4.3 10*3/uL — ABNORMAL HIGH (ref 0.7–4.0)
MCH: 27.4 pg (ref 26.0–34.0)
MCHC: 31.2 g/dL (ref 30.0–36.0)
MCV: 87.8 fL (ref 80.0–100.0)
Monocytes Absolute: 0.7 10*3/uL (ref 0.1–1.0)
Monocytes Relative: 6 %
Neutro Abs: 7.8 10*3/uL — ABNORMAL HIGH (ref 1.7–7.7)
Neutrophils Relative %: 56 %
Platelets: 297 10*3/uL (ref 150–400)
RBC: 4.6 MIL/uL (ref 3.87–5.11)
RDW: 14.2 % (ref 11.5–15.5)
WBC: 13.5 10*3/uL — ABNORMAL HIGH (ref 4.0–10.5)
nRBC: 0 % (ref 0.0–0.2)

## 2019-05-18 LAB — WET PREP, GENITAL
Clue Cells Wet Prep HPF POC: NONE SEEN
Sperm: NONE SEEN
Trich, Wet Prep: NONE SEEN
WBC, Wet Prep HPF POC: NONE SEEN
Yeast Wet Prep HPF POC: NONE SEEN

## 2019-05-18 LAB — PREGNANCY, URINE: Preg Test, Ur: NEGATIVE

## 2019-05-18 MED ORDER — ONDANSETRON HCL 4 MG/2ML IJ SOLN
4.0000 mg | Freq: Once | INTRAMUSCULAR | Status: DC
  Filled 2019-05-18: qty 2

## 2019-05-18 MED ORDER — METOCLOPRAMIDE HCL 5 MG/ML IJ SOLN: 10.0000 mg | Freq: Once | INTRAMUSCULAR | Status: DC

## 2019-05-18 MED ORDER — MORPHINE SULFATE (PF) 4 MG/ML IV SOLN
4.0000 mg | Freq: Once | INTRAVENOUS | Status: DC
  Filled 2019-05-18: qty 1

## 2019-05-18 MED ORDER — MORPHINE SULFATE (PF) 4 MG/ML IV SOLN
4.0000 mg | Freq: Once | INTRAVENOUS | Status: AC
  Administered 2019-05-18: 4 mg via INTRAVENOUS
  Filled 2019-05-18: qty 1

## 2019-05-18 MED ORDER — ONDANSETRON HCL 4 MG/2ML IJ SOLN
4.0000 mg | Freq: Once | INTRAMUSCULAR | Status: AC
  Administered 2019-05-18: 4 mg via INTRAVENOUS

## 2019-05-18 MED ORDER — METOCLOPRAMIDE HCL 5 MG/ML IJ SOLN
10.0000 mg | Freq: Once | INTRAMUSCULAR | Status: AC
  Administered 2019-05-18: 10 mg via INTRAVENOUS
  Filled 2019-05-18: qty 2

## 2019-05-18 MED ORDER — SODIUM CHLORIDE 0.9 % IV BOLUS
500.0000 mL | Freq: Once | INTRAVENOUS | Status: AC
  Administered 2019-05-18: 500 mL via INTRAVENOUS

## 2019-05-18 MED ORDER — OXYCODONE-ACETAMINOPHEN 5-325 MG PO TABS: 1.0000 | ORAL_TABLET | ORAL | 0 refills | Status: DC | PRN

## 2019-05-18 MED ORDER — SODIUM CHLORIDE (PF) 0.9 % IJ SOLN
INTRAMUSCULAR | Status: AC
  Administered 2019-05-18: 05:00:00
  Filled 2019-05-18: qty 50

## 2019-05-18 MED ORDER — IBUPROFEN 800 MG PO TABS: 800.0000 mg | ORAL_TABLET | Freq: Four times a day (QID) | ORAL | 0 refills | Status: DC | PRN

## 2019-05-18 MED ORDER — HYDROMORPHONE HCL 1 MG/ML IJ SOLN: 1.0000 mg | Freq: Once | INTRAMUSCULAR | Status: DC

## 2019-05-18 MED ORDER — MORPHINE SULFATE (PF) 4 MG/ML IV SOLN
4.0000 mg | Freq: Once | INTRAVENOUS | Status: AC
  Administered 2019-05-18: 4 mg via INTRAVENOUS

## 2019-05-18 MED ORDER — IOHEXOL 300 MG/ML  SOLN
100.0000 mL | Freq: Once | INTRAMUSCULAR | Status: AC | PRN
  Administered 2019-05-18: 100 mL via INTRAVENOUS

## 2019-05-18 NOTE — ED Provider Notes (Signed)
[facility]-EMERGENCY DEPT Provider Note   CSN: [id number] Arrival date & time: 05/17/19  1919     History   Chief Complaint Chief Complaint  Patient presents with   Abdominal Pain    lower abdomen    HPI Alyssa Ray is a 28 y.o. female.     Patient presents to the emergency department for evaluation of pelvic pain.  Patient reports that she has been experiencing pain on the left side of her lower abdomen and pelvic region since the early this morning.  She reports a constant dull aching pain with intermittent sharp stabbing pains that occur randomly throughout the day.  She has had nausea but no vomiting.  There has not been any diarrhea or constipation.  Patient denies urinary symptoms.  Patient does report that she had an abnormal period in November where she only lightly spotted for 1 day which is unusual for her.  She did take a home pregnancy test after that which was negative.     Past Medical History:  Diagnosis Date   ADHD (attention deficit hyperactivity disorder)    BV (bacterial vaginosis)    Chlamydia     Patient Active Problem List   Diagnosis Date Noted   ADHD (attention deficit hyperactivity disorder)    Chlamydia     Past Surgical History:  Procedure Laterality Date   CLEFT LIP REPAIR     TONSILLECTOMY       OB History   No obstetric history on file.      Home Medications    Prior to Admission medications   Medication Sig Start Date End Date Taking? Authorizing Provider  dicyclomine (BENTYL) 20 MG tablet Take 1 tablet (20 mg total) by mouth 2 (two) times daily. Patient not taking: Reported on 10/05/2018 11/28/15   Rolland Porter, MD  famotidine (PEPCID) 20 MG tablet Take 1 tablet (20 mg total) by mouth 2 (two) times daily. Patient not taking: Reported on 10/05/2018 05/05/18   Janne Napoleon, NP  hydrOXYzine (ATARAX/VISTARIL) 25 MG tablet Take 1 tablet (25 mg total) by mouth every 6 (six) hours. Patient not taking:  Reported on 10/05/2018 05/05/18   Janne Napoleon, NP  ibuprofen (ADVIL) 800 MG tablet Take 1 tablet (800 mg total) by mouth every 6 (six) hours as needed for moderate pain. 05/18/19   Gilda Crease, MD  meloxicam (MOBIC) 15 MG tablet Take 1 tablet (15 mg total) by mouth daily. Take 1 daily with food. Patient not taking: Reported on 10/05/2018 05/15/17   Arthor Captain, PA-C  ondansetron (ZOFRAN ODT) 4 MG disintegrating tablet  ODT q4 hours prn nausea/vomit Patient not taking: Reported on 10/05/2018 05/22/18   Muthersbaugh, Dahlia Client, PA-C  oxyCODONE-acetaminophen (PERCOCET) 5-325 MG tablet Take 1 tablet by mouth every 4 (four) hours as needed. 05/18/19   Gilda Crease, MD  pantoprazole (PROTONIX) 20 MG tablet Take 1 tablet (20 mg total) by mouth daily. Patient not taking: Reported on 10/05/2018 05/22/18   Muthersbaugh, Dahlia Client, PA-C  predniSONE (DELTASONE) 10 MG tablet Take 4 tablets (40 mg total) by mouth daily. Patient not taking: Reported on 10/05/2018 05/05/18   Janne Napoleon, NP    Family History History reviewed. No pertinent family history.  Social History Social History   Tobacco Use   Smoking status: Former Smoker   Smokeless tobacco: Never Used  Substance Use Topics   Alcohol use: Yes    Comment: 4drinks/wk   Drug use: Not Currently    Types:  Marijuana     Allergies   Hydrocodone-acetaminophen, Nickel, and Tramadol   Review of Systems Review of Systems  Gastrointestinal: Positive for abdominal pain and nausea.  Genitourinary: Positive for flank pain.  All other systems reviewed and are negative.    Physical Exam Updated Vital Signs BP (!) 111/51    Pulse 65    Temp (!) 97.3 F (36.3 C) (Oral)    Resp 14    Ht 5\' 4"  (1.626 m)    Wt 117.9 kg    LMP 05/02/2019 Comment: negative urine pregnancy test 05/18/19   SpO2 92%    BMI 44.63 kg/m   Physical Exam Vitals signs and nursing note reviewed. Exam conducted with a chaperone present.  Constitutional:        General: She is not in acute distress.    Appearance: Normal appearance. She is well-developed.  HENT:     Head: Normocephalic and atraumatic.     Right Ear: Hearing normal.     Left Ear: Hearing normal.     Nose: Nose normal.  Eyes:     Conjunctiva/sclera: Conjunctivae normal.     Pupils: Pupils are equal, round, and reactive to light.  Neck:     Musculoskeletal: Normal range of motion and neck supple.  Cardiovascular:     Rate and Rhythm: Regular rhythm.     Heart sounds: S1 normal and S2 normal. No murmur. No friction rub. No gallop.   Pulmonary:     Effort: Pulmonary effort is normal. No respiratory distress.     Breath sounds: Normal breath sounds.  Chest:     Chest wall: No tenderness.  Abdominal:     General: Bowel sounds are normal.     Palpations: Abdomen is soft.     Tenderness: There is abdominal tenderness in the left lower quadrant. There is no guarding or rebound. Negative signs include Murphy's sign and McBurney's sign.     Hernia: No hernia is present.  Genitourinary:    General: Normal vulva.     Vagina: Normal.     Cervix: Normal.     Adnexa: Right adnexa normal.       Left: Tenderness present.   Musculoskeletal: Normal range of motion.  Skin:    General: Skin is warm and dry.     Findings: No rash.  Neurological:     Mental Status: She is alert and oriented to person, place, and time.     GCS: GCS eye subscore is 4. GCS verbal subscore is 5. GCS motor subscore is 6.     Cranial Nerves: No cranial nerve deficit.     Sensory: No sensory deficit.     Coordination: Coordination normal.  Psychiatric:        Speech: Speech normal.        Behavior: Behavior normal.        Thought Content: Thought content normal.      ED Treatments / Results  Labs (all labs ordered are listed, but only abnormal results are displayed) Labs Reviewed  URINALYSIS, ROUTINE W REFLEX MICROSCOPIC - Abnormal; Notable for the following components:      Result Value    Color, Urine YELLOW (*)    APPearance HAZY (*)    Leukocytes,Ua SMALL (*)    Bacteria, UA RARE (*)    All other components within normal limits  CBC WITH DIFFERENTIAL/PLATELET - Abnormal; Notable for the following components:   WBC 13.5 (*)    Neutro Abs 7.8 (*)  Lymphs Abs 4.3 (*)    Abs Immature Granulocytes 0.08 (*)    All other components within normal limits  WET PREP, GENITAL  PREGNANCY, URINE  COMPREHENSIVE METABOLIC PANEL  LIPASE, BLOOD  GC/CHLAMYDIA PROBE AMP ([place]) NOT AT [facility]    EKG None  Radiology Ct Abdomen Pelvis W Contrast  Result Date: 05/18/2019 CLINICAL DATA:  Lower abdominopelvic pain for 1 day. Elevated white count. EXAM: CT ABDOMEN AND PELVIS WITH CONTRAST TECHNIQUE: Multidetector CT imaging of the abdomen and pelvis was performed using the standard protocol following bolus administration of intravenous contrast. CONTRAST:  OMNIPAQUE IOHEXOL 300 MG/ML  SOLN COMPARISON:  Pelvic ultrasound 05/18/2019. CT of the abdomen pelvis with contrast 05/21/2018 FINDINGS: Lower chest: The lung bases are clear without focal nodule, mass, or airspace disease. The heart size is normal. No significant pleural or pericardial effusion is present. Hepatobiliary: No focal liver abnormality is seen. No gallstones, gallbladder wall thickening, or biliary dilatation. Pancreas: Unremarkable. No pancreatic ductal dilatation or surrounding inflammatory changes. Spleen: Normal in size without focal abnormality. Adrenals/Urinary Tract: The adrenal glands are normal bilaterally. Kidneys and ureters are within normal limits. No stone or mass lesion is present. There is no obstruction. The urinary bladder is within normal limits. Stomach/Bowel: The stomach and duodenum are within normal limits. There is slight stranding within the small bowel mesentery. No discrete mass lesion or focal wall thickening is present. Terminal ileum is within normal limits. Appendix is visualized and normal.  Ascending and transverse colon are within normal limits. The descending and sigmoid colon are unremarkable. Vascular/Lymphatic: No significant vascular findings are present. No enlarged abdominal or pelvic lymph nodes. Reproductive: Uterus and bilateral adnexa are unremarkable. Other: No abdominal wall hernia or abnormality. No abdominopelvic ascites. Musculoskeletal: Vertebral body heights and alignment are maintained. No focal lytic or blastic lesions are present. Degenerative changes are present at L4-5 most prominently. Bony pelvis is intact. The hips are located and within normal limits. IMPRESSION: 1. No acute or focal lesion to explain the patient's symptoms. 2. Slight stranding within the small bowel mesentery without a discrete mass lesion or focal wall thickening. This may represent a nonspecific enteritis. 3. Degenerative changes of the lower lumbar spine are most prominent at L4-5. 4. No other acute or focal lesion to explain the patient's symptoms. Electronically Signed   By: Marin Roberts M.D.   On: 05/18/2019 04:38   US Pelvic Complete W Transvaginal And Torsion R/o  Result Date: 05/18/2019 CLINICAL DATA:  Pelvic pain EXAM: TRANSABDOMINAL AND TRANSVAGINAL ULTRASOUND OF PELVIS DOPPLER ULTRASOUND OF OVARIES TECHNIQUE: Both transabdominal and transvaginal ultrasound examinations of the pelvis were performed. Transabdominal technique was performed for global imaging of the pelvis including uterus, ovaries, adnexal regions, and pelvic cul-de-sac. It was necessary to proceed with endovaginal exam following the transabdominal exam to visualize the uterus, endometrium, ovaries and adnexa. Color and duplex Doppler ultrasound was utilized to evaluate blood flow to the ovaries. COMPARISON:  CT 05/21/2018 FINDINGS: Uterus Measurements: 8.6 x 3.1 x 4.4 cm = volume: 60 mL. No fibroids or other mass visualized. Endometrium Thickness: 6 mm in thickness.  No focal abnormality visualized. Right ovary  Measurements: 2.9 x 1.7 x 3.0 cm = volume: 7.5 mL. Normal appearance/no adnexal mass. Left ovary Measurements: 3.5 x 2.4 x 3.2 cm = volume: 14 mL. Normal appearance/no adnexal mass. Dominant follicle, 2.2 cm. Pulsed Doppler evaluation of both ovaries demonstrates normal low-resistance arterial and venous waveforms. Other findings No abnormal free fluid. IMPRESSION: No acute  findings or significant abnormality. No evidence of torsion. Electronically Signed   By: Charlett NoseKevin  Dover M.D.   On: 05/18/2019 03:12    Procedures Procedures (including critical care time)  Medications Ordered in ED Medications  ondansetron (ZOFRAN-ODT) disintegrating tablet 4 mg (4 mg Oral Given 05/17/19 2024)  sodium chloride 0.9 % bolus 500 mL (0 mLs Intravenous Stopped 05/18/19 0429)  morphine 4 MG/ML injection 4 mg (4 mg Intravenous Given 05/18/19 0300)  ondansetron (ZOFRAN) injection 4 mg (4 mg Intravenous Given 05/18/19 0258)  morphine 4 MG/ML injection 4 mg (4 mg Intravenous Given 05/18/19 0401)  metoCLOPramide (REGLAN) injection 10 mg (10 mg Intravenous Given 05/18/19 0401)  iohexol (OMNIPAQUE) 300 MG/ML solution 100 mL (100 mLs Intravenous Contrast Given 05/18/19 0405)  sodium chloride (PF) 0.9 % injection (  Given by Other 05/18/19 0434)     Initial Impression / Assessment and Plan / ED Course  I have reviewed the triage vital signs and the nursing notes.  Pertinent labs & imaging results that were available during my care of the patient were reviewed by me and considered in my medical decision making (see chart for details).        Patient presents to the emergency department for evaluation of pelvic pain.  Patient having moderate to severe left-sided pelvic pain present all day.  Examination revealed fairly significant tenderness in the area of the left adnexa, however I cannot palpate any masses on pelvic exam.  Pelvic exam is otherwise unremarkable.  Ultrasound was performed including Doppler, no evidence of  torsion or other abnormality noted.  Patient then sent back for CT scan to further evaluate, also negative.  Etiology is of unknown clear origin, but patient has been adequately ruled out for acute surgical process and severe emergent condition that requires further work-up.  Patient will be discharged, analgesia, follow-up with OB/GYN.  Final Clinical Impressions(s) / ED Diagnoses   Final diagnoses:  Pelvic pain    ED Discharge Orders         Ordered    ibuprofen (ADVIL) 800 MG tablet  Every 6 hours PRN     05/18/19 0616    oxyCODONE-acetaminophen (PERCOCET) 5-325 MG tablet  Every 4 hours PRN     05/18/19 0616           Gilda CreasePollina, Tynasia Mccaul J, MD 05/18/19 [phone]

## 2019-05-18 NOTE — ED Notes (Signed)
This nurse attempted IV access twice, unsuccessful.

## 2019-05-19 LAB — GC/CHLAMYDIA PROBE AMP (~~LOC~~) NOT AT ARMC
Chlamydia: NEGATIVE
Neisseria Gonorrhea: NEGATIVE

## 2020-04-12 LAB — PREGNANCY, URINE: Preg Test, Ur: POSITIVE

## 2020-04-19 ENCOUNTER — Encounter (HOSPITAL_COMMUNITY): Payer: Self-pay

## 2020-04-19 ENCOUNTER — Emergency Department (HOSPITAL_COMMUNITY)
Admission: EM | Admit: 2020-04-19 | Discharge: 2020-04-19 | Disposition: A | Discharge status: Home / Self Care | Payer: Medicaid Other | Attending: Emergency Medicine | Admitting: Emergency Medicine

## 2020-04-19 ENCOUNTER — Other Ambulatory Visit: Payer: Self-pay

## 2020-04-19 ENCOUNTER — Encounter: Payer: Self-pay | Admitting: *Deleted

## 2020-04-19 DIAGNOSIS — O26891 Other specified pregnancy related conditions, first trimester: Secondary | ICD-10-CM | POA: Diagnosis present

## 2020-04-19 DIAGNOSIS — Z87891 Personal history of nicotine dependence: Secondary | ICD-10-CM | POA: Insufficient documentation

## 2020-04-19 DIAGNOSIS — Z79899 Other long term (current) drug therapy: Secondary | ICD-10-CM | POA: Insufficient documentation

## 2020-04-19 DIAGNOSIS — Z3491 Encounter for supervision of normal pregnancy, unspecified, first trimester: Secondary | ICD-10-CM

## 2020-04-19 DIAGNOSIS — R519 Headache, unspecified: Secondary | ICD-10-CM

## 2020-04-19 DIAGNOSIS — Z3A00 Weeks of gestation of pregnancy not specified: Secondary | ICD-10-CM | POA: Diagnosis not present

## 2020-04-19 DIAGNOSIS — O131 Gestational [pregnancy-induced] hypertension without significant proteinuria, first trimester: Secondary | ICD-10-CM | POA: Insufficient documentation

## 2020-04-19 DIAGNOSIS — I10 Essential (primary) hypertension: Secondary | ICD-10-CM

## 2020-04-19 HISTORY — DX: Bipolar disorder, unspecified: F31.9

## 2020-04-19 LAB — PROTEIN / CREATININE RATIO, URINE
Creatinine, Urine: 55.38 mg/dL
Total Protein, Urine: <6 mg/dL

## 2020-04-19 LAB — COMPREHENSIVE METABOLIC PANEL
ALT: 43 U/L (ref 0–44)
AST: 46 U/L — ABNORMAL HIGH (ref 15–41)
Albumin: 3.9 g/dL (ref 3.5–5.0)
Alkaline Phosphatase: 49 U/L (ref 38–126)
Anion gap: 11 (ref 5–15)
BUN: 10 mg/dL (ref 6–20)
CO2: 21 mmol/L — ABNORMAL LOW (ref 22–32)
Calcium: 8.9 mg/dL (ref 8.9–10.3)
Chloride: 103 mmol/L (ref 98–111)
Creatinine, Ser: 0.67 mg/dL (ref 0.44–1.00)
GFR, Estimated: >60 mL/min (ref >60)
Glucose, Bld: 94 mg/dL (ref 70–99)
Potassium: 4.4 mmol/L (ref 3.5–5.1)
Sodium: 135 mmol/L (ref 135–145)
Total Bilirubin: 1.3 mg/dL — ABNORMAL HIGH (ref 0.3–1.2)
Total Protein: 7.4 g/dL (ref 6.5–8.1)

## 2020-04-19 LAB — CBC WITH DIFFERENTIAL/PLATELET
Abs Immature Granulocytes: 0.04 10*3/uL (ref 0.00–0.07)
Basophils Absolute: 0.1 10*3/uL (ref 0.0–0.1)
Basophils Relative: 1 %
Eosinophils Absolute: 0.4 10*3/uL (ref 0.0–0.5)
Eosinophils Relative: 4 %
HCT: 39 % (ref 36.0–46.0)
Hemoglobin: 12.3 g/dL (ref 12.0–15.0)
Immature Granulocytes: 0 %
Lymphocytes Relative: 27 %
Lymphs Abs: 2.4 10*3/uL (ref 0.7–4.0)
MCH: 27.7 pg (ref 26.0–34.0)
MCHC: 31.5 g/dL (ref 30.0–36.0)
MCV: 87.8 fL (ref 80.0–100.0)
Monocytes Absolute: 0.7 10*3/uL (ref 0.1–1.0)
Monocytes Relative: 8 %
Neutro Abs: 5.3 10*3/uL (ref 1.7–7.7)
Neutrophils Relative %: 60 %
Platelets: 274 10*3/uL (ref 150–400)
RBC: 4.44 MIL/uL (ref 3.87–5.11)
RDW: 14.1 % (ref 11.5–15.5)
WBC: 8.9 10*3/uL (ref 4.0–10.5)
nRBC: 0 % (ref 0.0–0.2)

## 2020-04-19 LAB — URINALYSIS, ROUTINE W REFLEX MICROSCOPIC
Bacteria, UA: NONE SEEN
Bilirubin Urine: NEGATIVE
Glucose, UA: NEGATIVE mg/dL
Ketones, ur: NEGATIVE mg/dL
Leukocytes,Ua: NEGATIVE
Nitrite: NEGATIVE
Protein, ur: NEGATIVE mg/dL
Specific Gravity, Urine: 1.008 (ref 1.005–1.030)
pH: 6 (ref 5.0–8.0)

## 2020-04-19 MED ORDER — METOCLOPRAMIDE HCL 5 MG/ML IJ SOLN
10.0000 mg | Freq: Once | INTRAMUSCULAR | Status: AC
  Administered 2020-04-19: 10 mg via INTRAVENOUS
  Filled 2020-04-19: qty 2

## 2020-04-19 MED ORDER — MAGNESIUM SULFATE 2 GM/50ML IV SOLN
2.0000 g | Freq: Once | INTRAVENOUS | Status: AC
  Administered 2020-04-19: 2 g via INTRAVENOUS
  Filled 2020-04-19: qty 50

## 2020-04-19 NOTE — ED Triage Notes (Signed)
Patient states that she is 2 months pregnant. Patient c/o headache, dizziness, and hypertension.

## 2020-04-19 NOTE — Discharge Instructions (Addendum)
You were seen in the emergency department for evaluation of headache, visual symptoms, and high blood pressure in the setting of early pregnancy.  We check some basic labs that were unremarkable and your symptoms improved with medication.  We reviewed your case with OB on-call for your group and they felt they were okay to follow-up in the clinic.  Please contact them to arrange a earlier appointment.  Return to the emergency department for any worsening or concerning symptoms.

## 2020-04-19 NOTE — ED Provider Notes (Signed)
[facility]-EMERGENCY DEPT Provider Note   CSN: [id number] Arrival date & time: 04/19/20  1238     History Chief Complaint  Patient presents with  . Dizziness  . 2 months pregnant  . Hypertension    Alyssa Ray is a 29 y.o. female.  She has an early pregnancy probably less than 2 months last menstrual period 9/18.  She has not followed up with OB yet but [facility]  For the past few days she has had frontal headache, sometimes lightheadedness, sometimes some spots in her vision.  She checked her blood pressure and it was elevated.  Her mother had a history of preeclampsia so she came here for evaluation.  She has not noticed any leg swelling no numbness or weakness no chest pain or shortness of breath.  She is tried nothing for her headache because she was unsure what to take during pregnancy.  She is G1, P0  The history is provided by the patient.  Hypertension This is a new problem. The current episode started 6 to 12 hours ago. The problem occurs constantly. The problem has not changed since onset.Associated symptoms include headaches. Pertinent negatives include no chest pain, no abdominal pain and no shortness of breath. Nothing aggravates the symptoms. Nothing relieves the symptoms. She has tried nothing for the symptoms. The treatment provided no relief.  Headache Pain location:  Frontal Quality:  Dull Radiates to:  Does not radiate Severity currently:  8/10 Severity at highest:  8/10 Onset quality:  Gradual Timing:  Intermittent Progression:  Unchanged Chronicity:  New Relieved by:  None tried Worsened by:  Nothing Ineffective treatments:  None tried Associated symptoms: dizziness, nausea and visual change   Associated symptoms: no abdominal pain, no blurred vision, no diarrhea, no eye pain, no facial pain, no fever, no focal weakness, no hearing loss, no neck stiffness, no photophobia, no sore throat and no  weakness        Past Medical History:  Diagnosis Date  . ADHD (attention deficit hyperactivity disorder)   . Bipolar 1 disorder (HCC)   . BV (bacterial vaginosis)   . Chlamydia     Patient Active Problem List   Diagnosis Date Noted  . ADHD (attention deficit hyperactivity disorder)   . Chlamydia     Past Surgical History:  Procedure Laterality Date  . CLEFT LIP REPAIR    . TONSILLECTOMY       OB History    Gravida  1   Para      Term      Preterm      AB      Living        SAB      TAB      Ectopic      Multiple      Live Births              Family History  Problem Relation Age of Onset  . Hypertension Mother     Social History   Tobacco Use  . Smoking status: Former Games developer  . Smokeless tobacco: Never Used  Vaping Use  . Vaping Use: Never used  Substance Use Topics  . Alcohol use: Not Currently  . Drug use: Not Currently    Types: Marijuana    Home Medications Prior to Admission medications   Medication Sig Start Date End Date Taking? Authorizing Provider  Prenatal Vit-Fe Fumarate-FA (PRENATAL MULTIVITAMIN) TABS tablet Take 1 tablet  by mouth daily at 12 noon.   Yes [provider]  busPIRone (BUSPAR) 5 MG tablet Take 5 mg by mouth 2 (two) times daily.  04/05/20   [provider]  dicyclomine (BENTYL) 20 MG tablet Take 1 tablet (20 mg total) by mouth 2 (two) times daily. Patient not taking: Reported on 10/05/2018 11/28/15   Rolland Porter, MD  famotidine (PEPCID) 20 MG tablet Take 1 tablet (20 mg total) by mouth 2 (two) times daily. Patient not taking: Reported on 10/05/2018 05/05/18   Janne Napoleon, NP  hydrOXYzine (ATARAX/VISTARIL) 25 MG tablet Take 1 tablet (25 mg total) by mouth every 6 (six) hours. Patient not taking: Reported on 10/05/2018 05/05/18   Janne Napoleon, NP  ibuprofen (ADVIL) 800 MG tablet Take 1 tablet (800 mg total) by mouth every 6 (six) hours as needed for moderate pain. Patient not taking: Reported on  04/19/2020 05/18/19   Gilda Crease, MD  meloxicam (MOBIC) 15 MG tablet Take 1 tablet (15 mg total) by mouth daily. Take 1 daily with food. Patient not taking: Reported on 10/05/2018 05/15/17   Arthor Captain, PA-C  ondansetron (ZOFRAN ODT) 4 MG disintegrating tablet 4mg  ODT q4 hours prn nausea/vomit Patient not taking: Reported on 10/05/2018 05/22/18   Muthersbaugh, 14/6/19, PA-C  oxyCODONE-acetaminophen (PERCOCET) 5-325 MG tablet Take 1 tablet by mouth every 4 (four) hours as needed. Patient not taking: Reported on 04/19/2020 05/18/19   14/1/20, MD  pantoprazole (PROTONIX) 20 MG tablet Take 1 tablet (20 mg total) by mouth daily. Patient not taking: Reported on 10/05/2018 05/22/18   Muthersbaugh, 14/6/19, PA-C  predniSONE (DELTASONE) 10 MG tablet Take 4 tablets (40 mg total) by mouth daily. Patient not taking: Reported on 10/05/2018 05/05/18   05/07/18, NP  sertraline (ZOLOFT) 50 MG tablet Take 50 mg by mouth daily. 04/09/20   [provider]    Allergies    Hydrocodone-acetaminophen, Nickel, and Tramadol  Review of Systems   Review of Systems  Constitutional: Negative for fever.  HENT: Negative for hearing loss and sore throat.   Eyes: Positive for visual disturbance (intermittent). Negative for blurred vision, photophobia and pain.  Respiratory: Negative for shortness of breath.   Cardiovascular: Negative for chest pain.  Gastrointestinal: Positive for nausea. Negative for abdominal pain and diarrhea.  Genitourinary: Negative for dysuria and vaginal bleeding.  Musculoskeletal: Negative for neck stiffness.  Skin: Negative for rash.  Neurological: Positive for dizziness and headaches. Negative for focal weakness and weakness.    Physical Exam Updated Vital Signs BP 123/88   Pulse 82   Temp 98.2 F (36.8 C) (Oral)   Resp 18   Ht 5\' 4"  (1.626 m)   Wt 127 kg   SpO2 100%   BMI 48.06 kg/m   Physical Exam Vitals and nursing note reviewed.    Constitutional:      General: She is not in acute distress.    Appearance: Normal appearance. She is well-developed.  HENT:     Head: Normocephalic and atraumatic.  Eyes:     Conjunctiva/sclera: Conjunctivae normal.  Cardiovascular:     Rate and Rhythm: Normal rate and regular rhythm.     Heart sounds: No murmur heard.   Pulmonary:     Effort: Pulmonary effort is normal. No respiratory distress.     Breath sounds: Normal breath sounds.  Abdominal:     Palpations: Abdomen is soft.     Tenderness: There is no abdominal tenderness.  Musculoskeletal:  General: Normal range of motion.     Cervical back: Neck supple.     Right lower leg: No edema.     Left lower leg: No edema.  Skin:    General: Skin is warm and dry.     Capillary Refill: Capillary refill takes less than 2 seconds.  Neurological:     General: No focal deficit present.     Mental Status: She is alert and oriented to person, place, and time.     Cranial Nerves: No cranial nerve deficit.     Sensory: No sensory deficit.     Motor: No weakness.     Gait: Gait normal.     ED Results / Procedures / Treatments   Labs (all labs ordered are listed, but only abnormal results are displayed) Labs Reviewed  COMPREHENSIVE METABOLIC PANEL - Abnormal; Notable for the following components:      Result Value   CO2 21 (*)    AST 46 (*)    Total Bilirubin 1.3 (*)    All other components within normal limits  URINALYSIS, ROUTINE W REFLEX MICROSCOPIC - Abnormal; Notable for the following components:   Hgb urine dipstick SMALL (*)    All other components within normal limits  CBC WITH DIFFERENTIAL/PLATELET  PROTEIN / CREATININE RATIO, URINE    EKG None  Radiology No results found.  Procedures Procedures (including critical care time)  Medications Ordered in ED Medications  metoCLOPramide (REGLAN) injection 10 mg (10 mg Intravenous Given 04/19/20 1508)  magnesium sulfate IVPB 2 g 50 mL (2 g Intravenous New  Bag/Given 04/19/20 1516)    ED Course  I have reviewed the triage vital signs and the nursing notes.  Pertinent labs & imaging results that were available during my care of the patient were reviewed by me and considered in my medical decision making (see chart for details).  Clinical Course as of Apr 19 1545  Wed Apr 19, 2020  1423 [facility] Janee Mornhompson who felt that she would be way too early for any type of preeclampsia.  Treatment of headache with typical migraine treatment.   [MB]  1540 Patient's headache is improved.  Her labs do not show any significant abnormalities.  Per discussion with OB we will discharge her and have her closely follow-up with them.   [MB]    Clinical Course User Index [MB] Terrilee FilesButler, Wave Calzada C, MD   MDM Rules/Calculators/A&P                         This patient complains of headache and visual symptoms elevated blood pressure; this involves an extensive number of treatment Options and is a complaint that carries with it a high risk of complications and Morbidity. The differential includes hypertensive urgency, migraine, essential hypertension, common headache, less likely preeclampsia as is only an early pregnancy  I ordered, reviewed and interpreted labs, which included CBC with normal white count normal hemoglobin, chemistries fairly normal other than mildly low bicarb and mild elevation of AST, urinalysis without signs of infection and no proteinuria I ordered medication Reglan and magnesium with improvement in her symptoms Previous records obtained and reviewed in epic, no recent visits I consulted OB on-call Dr. [facility] and discussed lab and imaging findings  Critical Interventions: None  After the interventions stated above, I reevaluated the patient and found patient's headache is much improved.  Her blood pressure  remains elevated.  Reviewed recommendations from [facility] with her and  she is comfortable plan for outpatient follow-up.  Return instructions discussed.   Final Clinical Impression(s) / ED Diagnoses Final diagnoses:  Hypertension, unspecified type  Generalized headache  First trimester pregnancy    Rx / DC Orders ED Discharge Orders    None       Terrilee Files, MD 04/19/20 1811

## 2020-04-20 ENCOUNTER — Telehealth: Payer: Self-pay | Admitting: Family Medicine

## 2020-04-20 NOTE — Telephone Encounter (Signed)
Returned patient's call. Patient reports she went to the ED with a BP of 170/95 yesterday. She was treated for migraines. She was given medication yesterday to help.   She does not have a history of high blood pressure. Today she reports it is 120-130/90's. She has a BP cuff at home. She reports she does not usually have headaches.   She does not have a Medicaid card and is waiting on her card to arrive to seek care. She reports it is approved and card should arrive in a few weeks.    She has an [place] next Friday with pregnancy care network.   Reviewed with patient that it is too early in the pregnancy for Pre E and that if her BP is elevated this early in pregnancy, that she may have already had high blood pressure.   Advised patient to establish care with PCP to follow BP soon. Gave her number for [facility] to get established with PCP. Reviewed that once we start seeing her we can help with management of BP.   Patient voiced understanding.

## 2020-04-20 NOTE — Telephone Encounter (Signed)
Patient haven't had first appointment yet but want to speak to someone about her blood pressure issues.

## 2020-04-25 ENCOUNTER — Other Ambulatory Visit: Payer: Self-pay

## 2020-04-25 ENCOUNTER — Encounter: Payer: Self-pay | Admitting: Family Medicine

## 2020-04-25 ENCOUNTER — Ambulatory Visit: Payer: Medicaid Other | Admitting: Family Medicine

## 2020-04-25 VITALS — BP 131/80 | HR 82 | Temp 99.8°F | Ht 64.0 in | Wt 286.0 lb

## 2020-04-25 DIAGNOSIS — I10 Essential (primary) hypertension: Secondary | ICD-10-CM | POA: Diagnosis not present

## 2020-04-25 MED ORDER — NIFEDIPINE ER 30 MG PO TB24: 30.0000 mg | ORAL_TABLET | Freq: Every day | ORAL | 1 refills | Status: DC

## 2020-04-25 NOTE — Patient Instructions (Addendum)
Keep taking BP at home   Nifedipine Extended-Release Oral Tablets What is this medicine? NIFEDIPINE (nye FED i peen) is a calcium channel blocker. It relaxes your blood vessels and decreases the amount of work the heart has to do. It treats high blood pressure and/or prevents chest pain (also called angina). This medicine may be used for other purposes; ask your health care provider or pharmacist if you have questions. COMMON BRAND NAME(S): Adalat CC, Afeditab CR, Nifediac CC, Nifedical XL, Procardia XL What should I tell my health care provider before I take this medicine? They need to know if you have any of these conditions:  blockage in your bowels  constipation  heart attack  heart disease  heart failure  liver disease  low blood pressure  an unusual or allergic reaction to nifedipine, other drugs, foods, dyes or preservatives  pregnant or trying to get pregnant  breast-feeding How should I use this medicine? Take this drug by mouth. Take it as directed on the prescription label at the same time every day. Do not cut, crush or chew this drug. Swallow the tablets whole. Some tablets need to be taken on an empty stomach. Ask your pharmacist or health care provider if you have any questions. Keep taking it unless your health care provider tells you to stop. Do not take this drug with grapefruit juice. Talk to your health care provider about the use of this drug in children. Special care may be needed. Overdosage: If you think you have taken too much of this medicine contact a poison control center or emergency room at once. NOTE: This medicine is only for you. Do not share this medicine with others. What if I miss a dose? If you miss a dose, take it as soon as you can. If it is almost time for your next dose, take only that dose. Do not take double or extra doses. What may interact with this medicine? Do not take this medicine with any of the following medications:  certain  medicines for seizures like carbamazepine, phenobarbital, phenytoin  lumacaftor; ivacaftor  rifabutin  rifampin  rifapentine  St. John's Wort This medicine may also interact with the following medications:  antiviral medicines for HIV or AIDS  certain medicines for blood pressure  certain medicines for diabetes  certain medicines for erectile dysfunction  certain medicines for fungal infections like ketoconazole, fluconazole, and itraconazole  certain medicines for irregular heart beat like flecainide and quinidine  certain medicines that treat or prevent blood clots like warfarin  clarithromycin  digoxin  dolasetron  erythromycin  fluoxetine  grapefruit juice  local or general anesthetics  nefazodone  orlistat  quinupristin; dalfopristin  sirolimus  stomach acid blockers like cimetidine, ranitidine, omeprazole, or pantoprazole  tacrolimus  valproic acid This list may not describe all possible interactions. Give your health care provider a list of all the medicines, herbs, non-prescription drugs, or dietary supplements you use. Also tell them if you smoke, drink alcohol, or use illegal drugs. Some items may interact with your medicine. What should I watch for while using this medicine? Visit your health care provider for regular checks on your progress. Check your blood pressure as directed. Ask your health care provider what your blood pressure should be. Also, find out when you should contact him or her. Do not treat yourself for coughs, colds, or pain while you are using this drug without asking your health care provider for advice. Some drugs may increase your blood pressure. You may  get drowsy or dizzy. Do not drive, use machinery, or do anything that needs mental alertness until you know how this drug affects you. Do not stand up or sit up quickly, especially if you are an older patient. This reduces the risk of dizzy or fainting spells. The tablet  shell for some brands of this drug does not dissolve. This is normal. The tablet shell may appear whole in the stool. This is not a cause for concern. What side effects may I notice from receiving this medicine? Side effects that you should report to your doctor or health care provider as soon as possible:  allergic reactions (skin rash, itching or hives; swelling of the face, lips, or tongue)  heart attack (trouble breathing; pain or tightness in the chest, neck, back or arms; unusually weak or tired)  heart failure (trouble breathing; fast, irregular heartbeat; sudden weight gain; swelling of the ankles, feet, hands; unusually weak or tired)  low blood pressure (dizziness; feeling faint or lightheaded, falls; unusually weak or tired) Side effects that usually do not require medical attention (report to your doctor or health care provider if they continue or are bothersome):  bloating  changes in emotions or moods  constipation  facial flushing  headache  nasal congestion (like runny or stuffy nose)  nausea  stomach pain This list may not describe all possible side effects. Call your doctor for medical advice about side effects. You may report side effects to FDA at 1-800-FDA-1088. Where should I keep my medicine? Keep out of the reach of children and pets. Store at room temperature between 20 and 25 degrees C (68 and 77 degrees F). Protect from light and moisture. Keep the container tightly closed. Throw away any unused drug after the expiration date. NOTE: This sheet is a summary. It may not cover all possible information. If you have questions about this medicine, talk to your doctor, pharmacist, or health care provider.  2020 Elsevier/Gold Standard (2019-03-30 08:24:11)

## 2020-04-25 NOTE — Progress Notes (Signed)
11/9/20212:36 PM  Alyssa Ray 10/25/1990, 29 y.o., female [id number]  Chief Complaint  Patient presents with  . high blood pressure readings    a week ago started seeing spots and had high readings at home and went to ER     HPI:   Patient is a 29 y.o. female with past medical history significant for Depression, GERD who presents today for HTN in pregnancy and ER f/u  Recent ED visit This patient complains of headache and visual symptoms elevated blood pressure; this involves an extensive number of treatment Options and is a complaint that carries with it a high risk of complications and Morbidity. The differential includes hypertensive urgency, migraine, essential hypertension, common headache, less likely preeclampsia as is only an early pregnancy  I ordered, reviewed and interpreted labs, which included CBC with normal white count normal hemoglobin, chemistries fairly normal other than mildly low bicarb and mild elevation of AST, urinalysis without signs of infection and no proteinuria I ordered medication Reglan and magnesium with improvement in her symptoms Previous records obtained and reviewed in epic, no recent visits I consulted OB on-call [facility] and discussed lab and imaging findings  After the interventions stated above, I reevaluated the patient and found patient's headache is much improved.  Her blood pressure remains elevated.  Reviewed recommendations from [facility] with her and she is comfortable plan for outpatient follow-up.  Return instructions discussed.  Takes BP at home Was 170 systolic in the ED Last night 500/37 8 weeks on Friday  No flowsheet data found.  No flowsheet data found.   Allergies  Allergen Reactions  . Hydrocodone-Acetaminophen Nausea And Vomiting  . Nickel Rash  . Tramadol Itching    "Makes me itch all over"    Prior to Admission medications   Medication Sig Start Date End Date Taking?  Authorizing Provider  busPIRone (BUSPAR) 5 MG tablet Take 5 mg by mouth 2 (two) times daily.  04/05/20   [provider]  dicyclomine (BENTYL) 20 MG tablet Take 1 tablet (20 mg total) by mouth 2 (two) times daily. Patient not taking: Reported on 10/05/2018 11/28/15   Rolland Porter, MD  famotidine (PEPCID) 20 MG tablet Take 1 tablet (20 mg total) by mouth 2 (two) times daily. Patient not taking: Reported on 10/05/2018 05/05/18   Janne Napoleon, NP  hydrOXYzine (ATARAX/VISTARIL) 25 MG tablet Take 1 tablet (25 mg total) by mouth every 6 (six) hours. Patient not taking: Reported on 10/05/2018 05/05/18   Janne Napoleon, NP  ibuprofen (ADVIL) 800 MG tablet Take 1 tablet (800 mg total) by mouth every 6 (six) hours as needed for moderate pain. Patient not taking: Reported on 04/19/2020 05/18/19   Gilda Crease, MD  meloxicam (MOBIC) 15 MG tablet Take 1 tablet (15 mg total) by mouth daily. Take 1 daily with food. Patient not taking: Reported on 10/05/2018 05/15/17   Arthor Captain, PA-C  ondansetron (ZOFRAN ODT) 4 MG disintegrating tablet 4mg  ODT q4 hours prn nausea/vomit Patient not taking: Reported on 10/05/2018 05/22/18   Muthersbaugh, 14/6/19, PA-C  oxyCODONE-acetaminophen (PERCOCET) 5-325 MG tablet Take 1 tablet by mouth every 4 (four) hours as needed. Patient not taking: Reported on 04/19/2020 05/18/19   14/1/20, MD  pantoprazole (PROTONIX) 20 MG tablet Take 1 tablet (20 mg total) by mouth daily. Patient not taking: Reported on 10/05/2018 05/22/18   Muthersbaugh, 14/6/19, PA-C  predniSONE (DELTASONE) 10 MG tablet Take 4 tablets (40 mg  total) by mouth daily. Patient not taking: Reported on 10/05/2018 05/05/18   Janne Napoleon, NP  Prenatal Vit-Fe Fumarate-FA (PRENATAL MULTIVITAMIN) TABS tablet Take 1 tablet by mouth daily at 12 noon.    [provider]  sertraline (ZOLOFT) 50 MG tablet Take 50 mg by mouth daily. 04/09/20   [provider]    Past Medical History:    Diagnosis Date  . ADHD (attention deficit hyperactivity disorder)   . Bipolar 1 disorder (HCC)   . BV (bacterial vaginosis)   . Chlamydia     Past Surgical History:  Procedure Laterality Date  . CLEFT LIP REPAIR    . TONSILLECTOMY      Social History   Tobacco Use  . Smoking status: Former Games developer  . Smokeless tobacco: Never Used  Substance Use Topics  . Alcohol use: Not Currently    Family History  Problem Relation Age of Onset  . Hypertension Mother   . Diabetes Maternal Grandmother     Review of Systems  Constitutional: Negative for malaise/fatigue.  Eyes: Negative for blurred vision and double vision.  Respiratory: Negative for shortness of breath.   Cardiovascular: Negative for chest pain.  Gastrointestinal: Negative for abdominal pain, heartburn, nausea and vomiting.  Neurological: Positive for headaches. Negative for dizziness, sensory change, focal weakness and weakness.  Psychiatric/Behavioral: Negative for depression.     OBJECTIVE:  Today's Vitals   04/25/20 1400  BP: 131/80  Pulse: 82  Temp: 99.8 F (37.7 C)  SpO2: 99%  Weight: 286 lb (129.7 kg)  Height: 5\' 4"  (1.626 m)   Body mass index is 49.09 kg/m.   Physical Exam Constitutional:      General: She is not in acute distress.    Appearance: Normal appearance. She is not ill-appearing.  HENT:     Head: Normocephalic.  Cardiovascular:     Rate and Rhythm: Normal rate and regular rhythm.     Pulses: Normal pulses.     Heart sounds: Normal heart sounds. No murmur heard.  No friction rub. No gallop.   Pulmonary:     Effort: Pulmonary effort is normal. No respiratory distress.     Breath sounds: Normal breath sounds. No stridor. No wheezing, rhonchi or rales.  Abdominal:     General: Bowel sounds are normal.     Palpations: Abdomen is soft.     Tenderness: There is no abdominal tenderness.  Musculoskeletal:     Right lower leg: No edema.     Left lower leg: No edema.  Skin:     General: Skin is warm and dry.  Neurological:     Mental Status: She is alert and oriented to person, place, and time.  Psychiatric:        Mood and Affect: Mood normal.        Behavior: Behavior normal.     No results found for this or any previous visit (from the past 24 hour(s)).  No results found.   ASSESSMENT and PLAN  Problem List Items Addressed This Visit    None    Visit Diagnoses    Essential hypertension    -  Primary   Relevant Medications   NIFEdipine (ADALAT CC) 30 MG 24 hr tablet     Follow up with OB, any questions contact Continue to monitor home BP daily Discussed r/se/b of stopping zoloft during pregnancy RTC and ED precautions given  Return if symptoms worsen or fail to improve.    Marshon Bangs, FNP-BC  Primary Care at [place], [place] [address] Ph.  [phone] Fax [phone]

## 2020-05-15 ENCOUNTER — Telehealth (INDEPENDENT_AMBULATORY_CARE_PROVIDER_SITE_OTHER): Payer: Medicaid Other | Admitting: *Deleted

## 2020-05-15 ENCOUNTER — Other Ambulatory Visit: Payer: Self-pay

## 2020-05-15 DIAGNOSIS — R030 Elevated blood-pressure reading, without diagnosis of hypertension: Secondary | ICD-10-CM

## 2020-05-15 DIAGNOSIS — F419 Anxiety disorder, unspecified: Secondary | ICD-10-CM | POA: Insufficient documentation

## 2020-05-15 DIAGNOSIS — O99210 Obesity complicating pregnancy, unspecified trimester: Secondary | ICD-10-CM | POA: Insufficient documentation

## 2020-05-15 DIAGNOSIS — Z3490 Encounter for supervision of normal pregnancy, unspecified, unspecified trimester: Secondary | ICD-10-CM | POA: Insufficient documentation

## 2020-05-15 DIAGNOSIS — F32A Depression, unspecified: Secondary | ICD-10-CM | POA: Insufficient documentation

## 2020-05-15 DIAGNOSIS — O26899 Other specified pregnancy related conditions, unspecified trimester: Secondary | ICD-10-CM

## 2020-05-15 DIAGNOSIS — F3289 Other specified depressive episodes: Secondary | ICD-10-CM

## 2020-05-15 MED ORDER — BLOOD PRESSURE KIT DEVI: 1.0000 | 0 refills | Status: DC | PRN

## 2020-05-15 NOTE — Progress Notes (Addendum)
New OB Intake   0930- Alyssa Ray and I both connected on MyChart but unable to hear each other. I called her and asked her to log out and back in .  Alyssa Bonsignore,RN  I connected with  Alyssa Ray on 05/15/20 at 0940 by MyChart and verified that I am speaking with the correct person using two identifiers. Nurse is located at [facility] and pt is located at home.  I discussed the limitations, risks, security and privacy concerns of performing an evaluation and management service by telephone and the availability of in person appointments. I also discussed with the patient that there may be a patient responsible charge related to this service. The patient expressed understanding and agreed to proceed.  I explained I am completing New OB Intake today. We discussed her EDD of 12/09/20 that is based on LMP of 03/04/20. Pt is G1/P0. She had her  Pregnancy confirmed at [facility]. She also reports she had a visit with [facility] and they did an early [place]. I asked her to sign a release so we can get those records.  I reviewed her allergies, medications, Medical/Surgical/OB history, and appropriate screenings. I informed her of [facility] services. Based on history, this is a/an uncomplicated pregnancy. She does report she had an ED visit in early pregnancy and bp elevated; they prescribed procardia but informed her she can wait until 1st visit with OB/GYN. She reports she has been taking bp at home and it has been about 130/80 so she has not started the procardia. We discussed she will discuss next week with provider. She states she is using a wrist cuff- we discussed we prefer arm cuff and will order one for her.   Concerns addressed today  MyChart/Babyscripts MyChart access verified. I explained pt will have some visits in office and some virtually. Babyscripts instructions given. Account successfully created and app downloaded.  Blood Pressure Cuff Blood pressure cuff ordered for patient to pick-up from [employer]. Explained after first prenatal appt pt will check weekly and document in Babyscripts.  Anatomy US Explained first scheduled [place] will be around 19 weeks. Anatomy US scheduled for 07/14/20 at 0815. Pt notified to arrive at 0815.  Labs Discussed Avelina Laine genetic screening with patient. Would like both [place] and Horizon drawn at new OB visit. Routine prenatal labs needed.  WIC Patient already has [facility]  COVID Vaccine Patient has not had vaccine.  First visit review I reviewed new OB appt with pt. I explained she will have a pelvic exam, ob bloodwork with genetic screening,patient states had pap about a year ago. Explained pt will be seen by Vonzella Nipple, PA at first visit; encounter routed to appropriate provider.  Alyssa Bowerman,RN 05/15/2020  9:33 AM

## 2020-05-15 NOTE — Progress Notes (Signed)
Chart reviewed for nurse visit. Agree with plan of care.   Marny Lowenstein, PA-C 05/15/2020 11:19 AM

## 2020-05-15 NOTE — Patient Instructions (Addendum)
Meet the Provider Zoom Sessions      [facility] for [facility] is now offering FREE monthly 1-hour virtual Zoom sessions for new, current, and prospective patients.        During these sessions, you can:   Learn about our practice, model of care, services   Get answers to questions about pregnancy and birth during COVID   Pick your provider's brain about anything else!    Sessions will be hosted by [employer] for [employer], Producer, television/film/video, Physicians and Midwives          No registration required      2021 Dates:      All at 6pm     October 21st     November 18th   December 16th     January 20th  February 17th    To join one of these meetings, a few minutes before it is set to start:     Copy/paste the link into your web browser:  [email or web address]    OR  Scan the QR code below (open up your camera and point towards QR code; click on tab that pops up on your phone ("zoom")      At [facility] [employer] [facility], we work as an integrated team, providing care to address both physical and emotional health. Your medical provider may refer you to see our Behavioral Health Clinician [facility]) on the same day you see your medical provider, as availability permits.  Our [facility] is available to all patients, visits generally last between 20-30 minutes, but can be longer or shorter, depending on patient need. The [facility] offers help with stress management, coping with symptoms of depression and anxiety, major life changes , sleep issues, changing risky behavior, grief and loss, life stress, working on personal life goals, and  behavioral health issues, as these all affect your overall health and wellness.  The [facility] is NOT available for the following: FMLA paperwork, court-ordered evaluations, specialty assessments (custody or disability), letters to  employers, or obtaining certification for an emotional support animal. The [facility] does not provide long-term therapy. You have the right to refuse integrated behavioral health services, or to reschedule to see the [facility] at a later date.  Exception: If you are having thoughts of suicide, we require that you either see the [facility] for further assessment, or contract for safety with your medical provider. Confidentiality exception: If it is suspected that a child or disabled adult is being abused or neglected, we are required by law to report that to either Child Protective Services or Adult Management consultant.  If you have a diagnosis of Bipolar affective disorder, Schizophrenia, or recurrent Major depressive disorder, we will recommend that you establish care with a psychiatrist, as these are lifelong, chronic conditions, and we want your overall emotional health and medications to be more closely monitored. If you anticipate needing extended maternity leave due to mental illness, it it recommended you inform your medical provider, so we can put in a referral to a  psychiatrist as soon as possible. The [facility] is unable to recommend an extended maternity leave for mental health issues. Your medical provider or [facility] may refer you to a therapist for ongoing, traditional therapy, or to a psychiatrist, for medication management, if it would benefit your overall health. Depending on your insurance, you may have a copay to see the [facility]. If you are uninsured, it is recommended that you apply for financial assistance. (  Forms may be requested at the front desk for in-person visits, via MyChart, or request a form during a virtual visit).  If you see the [facility] more than 6 times, you will have to complete a comprehensive clinical assessment interview with the [facility] to resume integrated services.  For virtual visits with the [facility], you must be physically in the state of [place] at the time of the visit. For example, if you live in  [place], you will have to do an in-person visit with the [facility]. If you are going out of the state or country for any reason, the [facility] may see you virtually when you return to [place], but not while you are physically outside of [place].

## 2020-05-17 ENCOUNTER — Inpatient Hospital Stay (HOSPITAL_COMMUNITY): Payer: Medicaid Other

## 2020-05-17 ENCOUNTER — Encounter (HOSPITAL_COMMUNITY): Payer: Self-pay | Admitting: Obstetrics & Gynecology

## 2020-05-17 ENCOUNTER — Other Ambulatory Visit: Payer: Self-pay

## 2020-05-17 ENCOUNTER — Inpatient Hospital Stay (HOSPITAL_COMMUNITY)
Admission: AD | Admit: 2020-05-17 | Discharge: 2020-05-17 | Disposition: A | Discharge status: Home / Self Care | Payer: Medicaid Other | Attending: Obstetrics & Gynecology | Admitting: Obstetrics & Gynecology

## 2020-05-17 ENCOUNTER — Telehealth: Payer: Self-pay | Admitting: Family Medicine

## 2020-05-17 DIAGNOSIS — O4691 Antepartum hemorrhage, unspecified, first trimester: Secondary | ICD-10-CM | POA: Insufficient documentation

## 2020-05-17 DIAGNOSIS — K219 Gastro-esophageal reflux disease without esophagitis: Secondary | ICD-10-CM | POA: Diagnosis not present

## 2020-05-17 DIAGNOSIS — R1032 Left lower quadrant pain: Secondary | ICD-10-CM | POA: Insufficient documentation

## 2020-05-17 DIAGNOSIS — Z79899 Other long term (current) drug therapy: Secondary | ICD-10-CM | POA: Diagnosis not present

## 2020-05-17 DIAGNOSIS — O26891 Other specified pregnancy related conditions, first trimester: Secondary | ICD-10-CM | POA: Diagnosis not present

## 2020-05-17 DIAGNOSIS — O468X1 Other antepartum hemorrhage, first trimester: Secondary | ICD-10-CM

## 2020-05-17 DIAGNOSIS — R1031 Right lower quadrant pain: Secondary | ICD-10-CM | POA: Insufficient documentation

## 2020-05-17 DIAGNOSIS — Z87891 Personal history of nicotine dependence: Secondary | ICD-10-CM | POA: Insufficient documentation

## 2020-05-17 DIAGNOSIS — Z3A10 10 weeks gestation of pregnancy: Secondary | ICD-10-CM | POA: Diagnosis not present

## 2020-05-17 DIAGNOSIS — R109 Unspecified abdominal pain: Secondary | ICD-10-CM

## 2020-05-17 DIAGNOSIS — O418X10 Other specified disorders of amniotic fluid and membranes, first trimester, not applicable or unspecified: Secondary | ICD-10-CM

## 2020-05-17 HISTORY — DX: Headache, unspecified: R51.9

## 2020-05-17 LAB — CBC WITH DIFFERENTIAL/PLATELET
Abs Immature Granulocytes: 0.05 10*3/uL (ref 0.00–0.07)
Basophils Absolute: 0.1 10*3/uL (ref 0.0–0.1)
Basophils Relative: 1 %
Eosinophils Absolute: 0.3 10*3/uL (ref 0.0–0.5)
Eosinophils Relative: 3 %
HCT: 36.1 % (ref 36.0–46.0)
Hemoglobin: 11.9 g/dL — ABNORMAL LOW (ref 12.0–15.0)
Immature Granulocytes: 1 %
Lymphocytes Relative: 23 %
Lymphs Abs: 2.5 10*3/uL (ref 0.7–4.0)
MCH: 27.9 pg (ref 26.0–34.0)
MCHC: 33 g/dL (ref 30.0–36.0)
MCV: 84.5 fL (ref 80.0–100.0)
Monocytes Absolute: 0.6 10*3/uL (ref 0.1–1.0)
Monocytes Relative: 6 %
Neutro Abs: 7.5 10*3/uL (ref 1.7–7.7)
Neutrophils Relative %: 66 %
Platelets: 267 10*3/uL (ref 150–400)
RBC: 4.27 MIL/uL (ref 3.87–5.11)
RDW: 13.7 % (ref 11.5–15.5)
WBC: 11 10*3/uL — ABNORMAL HIGH (ref 4.0–10.5)
nRBC: 0 % (ref 0.0–0.2)

## 2020-05-17 LAB — URINALYSIS, ROUTINE W REFLEX MICROSCOPIC
Bilirubin Urine: NEGATIVE
Glucose, UA: NEGATIVE mg/dL
Hgb urine dipstick: NEGATIVE
Ketones, ur: NEGATIVE mg/dL
Nitrite: NEGATIVE
Protein, ur: NEGATIVE mg/dL
Specific Gravity, Urine: 1.02 (ref 1.005–1.030)
pH: 6 (ref 5.0–8.0)

## 2020-05-17 LAB — WET PREP, GENITAL
Clue Cells Wet Prep HPF POC: NONE SEEN
Sperm: NONE SEEN
Trich, Wet Prep: NONE SEEN
Yeast Wet Prep HPF POC: NONE SEEN

## 2020-05-17 LAB — HIV ANTIBODY (ROUTINE TESTING W REFLEX): HIV Screen 4th Generation wRfx: NONREACTIVE

## 2020-05-17 MED ORDER — KETOROLAC TROMETHAMINE 60 MG/2ML IM SOLN
60.0000 mg | Freq: Once | INTRAMUSCULAR | Status: AC
  Administered 2020-05-17: 60 mg via INTRAMUSCULAR
  Filled 2020-05-17: qty 2

## 2020-05-17 NOTE — Telephone Encounter (Signed)
Patient called in stating she has been having some pain, what to know if she should go to the ed

## 2020-05-17 NOTE — ED Triage Notes (Signed)
Emergency Medicine Provider OB Triage Evaluation Note  Alyssa Ray is a 29 y.o. female, G1P0, at [redacted]w[redacted]d gestation who presents to the emergency department with complaints of lower abdominal pain.  Review of  Systems  Positive: lower abdominal pain Negative: bleeding, discharge, urinary symptoms  Physical Exam  BP (!) 142/64 (BP Location: Left Arm)   Pulse 79   Temp 98.3 F (36.8 C) (Oral)   Resp 18   Ht 5\' 4"  (1.626 m)   Wt 129.3 kg   LMP 03/04/2020   SpO2 99%   BMI 48.92 kg/m  General: Awake, no distress  HEENT: Atraumatic  Resp: Normal effort  Cardiac: Normal rate Abd: Nondistended, nontender  MSK: Moves all extremities without difficulty Neuro: Speech clear  Medical Decision Making  Pt evaluated for pregnancy concern and is stable for transfer to MAU. Pt is in agreement with plan for transfer.  1:20 PM Discussed with MAU APP, who accepts patient in transfer.  Clinical Impression   1. Abdominal pain during pregnancy in first trimester        03/06/2020, PA-C 05/17/20 1320

## 2020-05-17 NOTE — MAU Note (Signed)
Pt transferred from [facility] with c/o lower abd pain since yesterday. Pain got worse this morning. Abd pain wraps around to her back . Reports some pink mucusy discharge last night but none today.
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
## 2020-05-17 NOTE — Telephone Encounter (Signed)
Called patient and she reports she is at the ED being assessed now. Patient to call if she has further questions or concerns.

## 2020-05-17 NOTE — Discharge Instructions (Signed)

## 2020-05-18 LAB — ABO/RH: ABO/RH(D): A POS

## 2020-05-18 LAB — GC/CHLAMYDIA PROBE AMP (~~LOC~~) NOT AT ARMC
Chlamydia: NEGATIVE
Comment: NEGATIVE
Comment: NORMAL
Neisseria Gonorrhea: NEGATIVE

## 2020-05-19 LAB — CULTURE, OB URINE

## 2020-05-26 ENCOUNTER — Ambulatory Visit (INDEPENDENT_AMBULATORY_CARE_PROVIDER_SITE_OTHER): Payer: Medicaid Other | Admitting: Medical

## 2020-05-26 ENCOUNTER — Other Ambulatory Visit: Payer: Self-pay

## 2020-05-26 ENCOUNTER — Encounter: Payer: Self-pay | Admitting: Medical

## 2020-05-26 ENCOUNTER — Encounter: Payer: Self-pay | Admitting: *Deleted

## 2020-05-26 VITALS — BP 129/86 | HR 84 | Wt 285.6 lb

## 2020-05-26 DIAGNOSIS — Z3A11 11 weeks gestation of pregnancy: Secondary | ICD-10-CM

## 2020-05-26 DIAGNOSIS — Z3491 Encounter for supervision of normal pregnancy, unspecified, first trimester: Secondary | ICD-10-CM | POA: Diagnosis not present

## 2020-05-26 DIAGNOSIS — O99210 Obesity complicating pregnancy, unspecified trimester: Secondary | ICD-10-CM

## 2020-05-26 DIAGNOSIS — F3289 Other specified depressive episodes: Secondary | ICD-10-CM

## 2020-05-26 DIAGNOSIS — F419 Anxiety disorder, unspecified: Secondary | ICD-10-CM

## 2020-05-26 LAB — POCT URINALYSIS DIP (DEVICE)
Bilirubin Urine: NEGATIVE
Glucose, UA: NEGATIVE mg/dL
Hgb urine dipstick: NEGATIVE
Ketones, ur: NEGATIVE mg/dL
Leukocytes,Ua: NEGATIVE
Nitrite: NEGATIVE
Protein, ur: NEGATIVE mg/dL
Specific Gravity, Urine: 1.025 (ref 1.005–1.030)
Urobilinogen, UA: 0.2 mg/dL (ref 0.0–1.0)
pH: 6 (ref 5.0–8.0)

## 2020-05-26 MED ORDER — ASPIRIN EC 81 MG PO TBEC: 81.0000 mg | DELAYED_RELEASE_TABLET | Freq: Every day | ORAL | 11 refills | Status: DC
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
## 2020-05-26 NOTE — Progress Notes (Signed)
   PRENATAL VISIT NOTE  Subjective:  Alyssa Ray is a 29 y.o. G2P0010 at [redacted]w[redacted]d being seen today for her first prenatal visit for this pregnancy.  She is currently monitored for the following issues for this low-risk pregnancy and has ADHD (attention deficit hyperactivity disorder); Obesity in pregnancy; Supervision of low-risk pregnancy; Depression; and Anxiety on their problem list.  Patient reports no complaints.  Contractions: Not present. Vag. Bleeding: None.  Movement: Absent. Denies leaking of fluid.   She is planning to breastfeed. Desires contraception, unsure of type.   The following portions of the patient's history were reviewed and updated as appropriate: allergies, current medications, past family history, past medical history, past social history, past surgical history and problem list.   Objective:   Vitals:   05/26/20 0923  BP: 129/86  Pulse: 84  Weight: 285 lb 9.6 oz (129.5 kg)    Fetal Status: Fetal Heart Rate (bpm): 157   Movement: Absent     General:  Alert, oriented and cooperative. Patient is in no acute distress.  Skin: Skin is warm and dry. No rash noted.   Cardiovascular: Normal heart rate and rhythm noted  Respiratory: Normal respiratory effort, no problems with respiration noted. Clear to auscultation.   Abdomen: Soft, gravid, appropriate for gestational age. Normal bowel sounds. Non-tender. Pain/Pressure: Present     Pelvic: Deferred, just completed in MAU last week  Extremities: Normal range of motion.     Mental Status: Normal mood and affect. Normal behavior. Normal judgment and thought content.   Assessment and Plan:  Pregnancy: G2P0010 at [redacted]w[redacted]d 1. Encounter for supervision of low-risk pregnancy in first trimester - CBC/D/Plt+RPR+Rh+ABO+Rub Ab... - Genetic Screening - GC/Chlamydia and urine culture performed in MAU on 05/17/20 - Pap smear at [facility] recently, ROI requested   2. Obesity in pregnancy - Hemoglobin A1c - Comp Met (CMET) - aspirin  EC 81 MG tablet; Take 1 tablet (81 mg total) by mouth daily. Swallow whole.  Dispense: 30 tablet; Refill: 11  3. Other depression - No current medications, discontinued when she got pregnant, seeing psych throughout pregnancy   4. Anxiety   Preterm labor/first trimester warning symptoms and general obstetric precautions including but not limited to vaginal bleeding, contractions, leaking of fluid and fetal movement were reviewed in detail with the patient. Please refer to After Visit Summary for other counseling recommendations.   Discussed the normal visit cadence for prenatal care Discussed the nature of our practice with multiple providers including residents and students   Return in about 4 weeks (around 06/23/2020) for LOB, any provider, In-Person.  Future Appointments  Date Time Provider [place]  06/27/2020  9:35 AM Kieth Brightly [facility]  07/06/2020  3:20 PM Just, Laurita Quint, FNP PCP-PCP [facility]  07/14/2020  8:00 AM [facility] NURSE [facility]  07/14/2020  8:15 AM [facility] US2 [facility]    Kerry Hough, PA-C

## 2020-05-26 NOTE — Patient Instructions (Addendum)
Safe Medications in Pregnancy   Acne:  Benzoyl Peroxide  Salicylic Acid   Backache/Headache:  Tylenol: 2 regular strength every 4 hours OR        2 Extra strength every 6 hours   Colds/Coughs/Allergies:  Benadryl (alcohol free) 25 mg every 6 hours as needed  Breath right strips  Claritin  Cepacol throat lozenges  Chloraseptic throat spray  Cold-Eeze- up to three times per day  Cough drops, alcohol free  Flonase (by prescription only)  Guaifenesin  Mucinex  Robitussin DM (plain only, alcohol free)  Saline nasal spray/drops  Sudafed (pseudoephedrine) & Actifed * use only after [redacted] weeks gestation and if you do not have high blood pressure  Tylenol  Vicks Vaporub  Zinc lozenges  Zyrtec   Constipation:  Colace  Ducolax suppositories  Fleet enema  Glycerin suppositories  Metamucil  Milk of magnesia  Miralax  Senokot  Smooth move tea   Diarrhea:  Kaopectate  Imodium A-D   *NO pepto Bismol   Hemorrhoids:  Anusol  Anusol HC  Preparation H  Tucks   Indigestion:  Tums  Maalox  Mylanta  Zantac  Pepcid   Insomnia:  Benadryl (alcohol free) 25mg  every 6 hours as needed  Tylenol PM  Unisom, no Gelcaps   Leg Cramps:  Tums  MagGel   Nausea/Vomiting:  Bonine  Dramamine  Emetrol  Ginger extract  Sea bands  Meclizine  Nausea medication to take during pregnancy:  Unisom (doxylamine succinate 25 mg tablets) Take one tablet daily at bedtime. If symptoms are not adequately controlled, the dose can be increased to a maximum recommended dose of two tablets daily (1/2 tablet in the morning, 1/2 tablet mid-afternoon and one at bedtime).  Vitamin B6 100mg  tablets. Take one tablet twice a day (up to 200 mg per day).   Skin Rashes:  Aveeno products  Benadryl cream or 25mg  every 6 hours as needed  Calamine Lotion  1% cortisone cream   Yeast infection:  Gyne-lotrimin 7  Monistat 7    **If taking multiple medications, please check labels to avoid  duplicating the same active ingredients  **take medication as directed on the label  ** Do not exceed 4000 mg of tylenol in 24 hours  **Do not take medications that contain aspirin or ibuprofen         AREA PEDIATRIC/FAMILY PRACTICE PHYSICIANS  [place]outheast [place] ( ) . [facility] [place]; [id number], MD; [facility], MD; Melodie Bouillon, MD; McDiarmid, MD; Lum Babe, MD; Sheffield Slider, MD; Leveda Anna, MD o [address] [place], [address] [place] o [phone] o Mon-Fri 8:30-12:30, 1:30-5:00 o Providers come to see babies at [facility] o Accepting Medicaid . [facility] [place] o Limited providers who accept newborns: [phone], MD; [facility], MD; [place], MD o [address], [place], Paulino Rily [address] o [phone] o Mon-Fri 8:00-5:30 o Babies seen by providers at [facility] o Does NOT accept Medicaid o Please call early in hospitalization for appointment (limited availability)  . [facility] [phone], MD o [address], [place], Fatima Sanger [address] o [phone] o Mon, Tue, Thur, Fri 8:30-5:00, Wed 10:00-7:00 (closed 1-2pm) o Babies seen by [facility] providers o Accepting Medicaid . 01-15-1968 - Pediatrician 06-04-1997, MD o [address], [place], Fae Pippin [place] o [phone] o Mon-Fri 8:30-5:00, Sat 8:30-12:00 o Provider comes to see babies at [facility] o Accepting Medicaid o Must have been referred from current patients or contacted office  prior to delivery . [facility]) Leotis Pain, MD; Ave Filter, MD; Luna Fuse, MD; Kennedy Bucker, MD; Konrad Dolores, MD; Kathlene November, MD; Jenne Campus, MD; Lubertha South, MD; Wynetta Emery, MD; Duffy Rhody, MD; Gerre Couch, NP; Shirl Harris, NP o [address] [place], [place], [place] [address] o [phone] o Mon, Tue, Thur, Fri 8:30-5:30, Wed 9:30-5:30, Sat 8:30-12:30 o Babies seen by [facility]  providers o Accepting Medicaid o Only accepting infants of first-time parents or siblings of current patients [facility] discharge coordinator will make follow-up appointment . Cyril Mourning o [address] [place], [place]  [address] o [phone]   Fax - [phone] . [facility] o [address], [place], [place]  [address] o Phone - [phone]   Fax - [phone] . Lucio Edward o [address], [place], [place]  [address] o [phone]  [place] [phone]) . [facility] Jorge Mandril, MD; Alita Chyle, MD; Princella Ion, MD; MD; Earlene Plater, MD; Jamesetta Orleans, MD; Alvera Novel, MD; Clarene Duke, MD; Rana Snare, MD; Carmon Ginsberg, MD; Alinda Money, MD; Hosie Poisson, MD; Mayford Knife, MD o [address] [place], [place] [address] o [phone] o Mon-Fri 8:30-5:00 (extended evenings Mon-Thur as needed), Sat-Sun 10:00-1:00 o Providers come to see babies at [facility] o Accepting Medicaid for families of first-time babies and families with all children in the household age 47 and under. Must register with office prior to making appointment (M-F only). [facility] Odella Aquas, NP; Lynelle Doctor, MD; Susann Givens, MD; [place], [place] o [address], [place], [place] [address] o [phone] o Mon-Fri 8:00-5:00 o Babies seen by providers at [facility] o Does NOT accept Medicaid/Commercial Insurance Only . [facility] [place] ([facility])  Suzette Battiest, MD; Zachery Dauer, MD; Stefan Church, MD; Sabino Dick, MD; Quitman Livings, MD; Farris Has, MD; Gaynell Face, MD; Betha Loa, MD; Colon Flattery, MD; Clifton James, MD o [address] [place] [place], [place] [address] o [phone] o Mon-Fri 8:30-5:30, Sat (Oct.-Mar.) 9:00-1:00 o Babies seen by providers at [facility] o Accepting [facility] [phone]) . [facility] D[facility]ri, MD; Sheliah Hatch, MD o [address], [place], [place] [address] o [phone] o Mon-Fri 8:30-5:00, Sat  8:30-12:00 o Providers come to see babies at [facility] o Does NOT accept Medicaid . [facility] Cindy Hazy, [place]; Frannie, MD; [place], [place]; Wynelle Link, MD; Azucena Cecil, MD o [address], [place], [place] [address] o [phone] o Mon-Fri 8:00-5:00 o Babies seen by providers at [facility] o Does NOT accept Medicaid o Only accepting babies of parents who are patients o Please call early in hospitalization for appointment (limited availability) . [facility] Lamar Benes, MD; Abran Cantor, MD; Early Osmond, MD; Cherre Huger, NP; Hyacinth Meeker, MD; Dwan Bolt, MD; Jarold Motto, NP; Dario Guardian, MD; Talmage Nap, MD; Maisie Fus, MD; Pricilla Holm, MD; Tama High, MD o [address] [place], [place], [place] [address] o [phone] o Mon-Fri 8:00-5:00, Sat 9:00-12:00 o Providers come to see babies at [facility] o Does NOT accept [facility] [phone]) . [facility] o Limited providers accepting new patients: Drema Pry, NP; [place], [place] o [address], [place], [place] [address] o [phone] o Mon-Fri 8:00-5:00 o Babies seen by providers at [facility] o Does NOT accept Medicaid o Only accepting babies of parents who are patients o Please call early in hospitalization for appointment (limited availability) . [facility] Luan Pulling, MD; Nash Dimmer, MD o [address] [place] [address] [place], [place] [address]  o [phone] (press 1 to schedule appointment) o Mon-Fri 8:00-5:00 o Providers come to see babies at [facility] o Does NOT accept Medicaid . [facility] Cristino Martes, MD o [address], [place], [place] [address] o [phone] o Mon-Fri 8:30-5:00 (lunch 12:30-1:00), extended hours by appointment only Wed 5:00-6:30 o Babies seen by [facility] providers o Accepting Medicaid . [facility]bot, MD; [place], MD; Hassan Rowan, MD o [address] [place], [place], [place] [address] o [phone] o Mon-Fri  8:00-5:00 o Babies seen by [facility] providers o Does NOT accept Medicaid . Nature conservation officer at [place] [address] Elsworth Soho, MD; Durene Cal, MD; [place], DO o [address] [place], [place] [address] o [phone] o Mon-Fri 8:00-5:00 o Babies seen by [facility] providers o Does NOT accept Medicaid . [facility] o [place], [place]; [place], [place]; [place], NP; Avis Epley, MD; Vonna Kotyk, MD; Clance Boll, MD; Stevphen [place], NP; Arvilla Market, NP; Ann Maki, NP; Otis Dials, NP; Vaughan Basta, MD; [place], MD o [address] [place], [place] [address] o [phone] o Mon-Fri 8:30-5:00, Sat 10:00-1:00 o Providers come to see babies at [facility] o Does NOT accept Medicaid o Free prenatal information session Tuesdays at 4:45pm . [facility] Luna Kitchens, MD; [place], [place]; [place], [place]; [place], [place] o [address] [place] [address] o [phone] o Mon-Fri 7:30-5:30 o Babies seen by [facility] providers . [facility] Children[facility] Doctor o [address], [place], [place]  [address] o [phone]   Fax - [phone]  [place] [phone] & [phone]) . [facility] Alphonsa Overall, MD o [address] [place], [place] [address] o [phone] o Mon-Thur 8:00-6:00 o Providers come to see babies at [facility] o Accepting Medicaid . [facility] Zenon Mayo, NP; Cyndia Bent, MD; [place], [place]; [place], [place] o [address] [place], [place] [address] o [phone] o Mon-Thur 7:30-7:30, Fri 7:30-4:30 o Babies seen by [facility] providers o Accepting Medicaid . [facility] Cheryle Horsfall, MD; Janene Harvey, NP; Vonita Moss, MD o [address], [place], [place] [address] o [phone] o Mon-Fri 8:30-5:00, Sat 8:30-12:00 o Providers come to see babies at [facility] o Accepting Medicaid o Must have "Meet & Greet" appointment at office prior to delivery . [facility] [place] ([facility]) Llana Aliment, MD; Earlene Plater, MD; Lucretia Roers, MD o [address], [place], [place] [address] o [phone] o Mon-Wed 8:00-6:00, Thur-Fri 8:00-5:00, Sat 9:00-12:00 o Providers come to see babies at [facility] o Does NOT accept Medicaid o Only accepting siblings of current patients . [facility]  o [address], [place], [place]  [address] o [phone]   Fax - [phone] . [facility] at [facility] o [phone] [address], [place], [place]  [address] o [phone]   Fax - [phone]  [place] [phone] & [phone]) . Nature conservation officer at [facility] o [place], DO; [place], DO o [address] [place], [place] [address] o [phone] o Mon-Fri 7:00-5:00 o Babies seen by [facility] providers o Does NOT accept Medicaid . [facility] Ellis Savage, MD; [place], [place]; [employer], [place] o [address], [place], [place] [address] o [phone] o Mon-Fri 8:00-5:00 o Babies seen by [facility] providers o Accepting Medicaid . [facility] - [address] Franne Forts, MD; [place], [place]; [place], NP; [place], [place] o [address] [place], [place], [place] [address]  o [phone] o Mon-Fri 8:00-5:00 o Babies seen by providers at [facility] o Accepting [facility] [phone]) . [facility] [place], [place] o [address] [place], [place] [address] o [phone] o Mon-Fri 8:00-5:00 o Babies seen by [facility] providers o Does NOT accept Medicaid o Limited availability, please call early in hospitalization to schedule follow-up . [facility] Jolee Ewing, PA; Eddie Candle, MD; [place], MD; [place], [place]; Constance Goltz, MD; [place], [place] o [facility] [address], [place], [place] [address] o [phone] o Mon-Fri 8:30-5:00, Sat 9:00-12:00 o Babies seen by providers at  [facility] o Accepting Medicaid o Please register online then schedule online or call office o [email or web address] . [facility]) Samuella Bruin, NP; Lucianne Muss, MD; Lanier Clam, PA o [address], [place], [place] [address] o [phone] o Mon-Fri 8:00-5:00 o Babies seen by providers at [facility] o Accepting Medicaid . [facility] ([facility]) Sharin Mons, MD; Reed Breech, NP; Shelva Majestic, MD o [address], [place], [place] [address] o [phone] o Mon-Fri 8:00-5:30, Sat&Sun by appointment (phones open at 8:30) o Babies seen by [facility] providers o Accepting Medicaid o Must be a first-time baby or sibling of current patient . [facility] [address], [place], [place]  [address] o [phone]   Fax - [phone]  [place] [phone] & [phone]) . [facility] Medicine o [place], [place]; [place], [place]; Dimple Casey, MD; [place], [place]; Carolyne Fiscal, MD o [address] [place], [place] [address] o [phone] o Mon-Thur 8:00-7:00, Fri 8:00-5:00, Sat 8:00-12:00, Sun 9:00-12:00 o Babies seen by [facility] providers o Accepting Medicaid . [facility] at [facility], MD; Gaynell Face, MD; [facility], MD o [address], [place], [place] [address] o [phone] o Mon-Thur 8:00-5:00 o Babies seen by providers at [facility] o Accepting Medicaid . [facility] [place] Gwenlyn Saran, MD; [place], MD; Madilyn Fireman, MD; Melvyn Neth, MD; List, MD; Lazarus Salines, MD; Gaynell Face, MD; Berneda Rose, MD; Flora Lipps, MD; Beryl Meager, MD; Luther Redo, MD; Lavonia Drafts, MD; Kellie Simmering, MD o [address] [place] [place], [place] [address] o [phone] o Mon-Fri 8:00-5:30, Sat (Oct.-Mar.) 9:00-1:00 o Babies seen by providers at [facility] o Accepting Medicaid o Must fill out  new patient packet, available online at [email or web address] . [facility] [place] [facility] at [facility]) Simone Curia, NP; Tiburcio Pea, NP; Tresa Endo, NP; Whitney Post, MD; [place], [place]; Hennie Duos, MD; Wynne Dust, MD; Kavin Leech, NP o [address], [place], [place] [address] o [phone] o Mon-Thur 8:00-5:30, Fri 8:00-5:00 o Babies seen by providers at [facility] o Accepting [facility] [phone]) . [facility] o [place], [place]; [place], MD; Tanya Nones, MD; [place], [place] o [address] [place], [place] [address] o [phone] o Mon-Fri 8:00-5:00 o Babies seen by providers at [facility] o Accepting [facility] [phone]) . [facility] at [facility] o [place], DO; Lenise Arena, MD; [place], [place] o [address], [place], [place] [address] o [phone] o Mon-Fri 8:00-5:00 o Babies seen by providers at [facility] o Does NOT accept Medicaid o Limited appointment availability, please call early in hospitalization  . Nature conservation officer at [facility] o [place], [place];  [place], MD o [address], [place], [place] [address] o [phone] o Mon-Fri 8:00-5:00 o Babies seen by [facility] providers o Does NOT accept Medicaid . [facility] Su Grand, MD; Guy Sandifer, MD; [place], [place]; [place], [address], [place], [place] [address] o [phone] o Mon-Fri 8:00-5:00 o After hours clinic [facility] [place], [place] [address]) [phone] Mon-Fri 5:00-8:00, Sat 12:00-6:00, Sun 10:00-4:00 o Babies seen by [facility] providers o Accepting Medicaid . [place] [facility] o [address] [place], [place]  [address] o [phone]   Fax - [phone]  [place] [phone]) . Therapist, music at [facility], MD o [address] [place], [place], [place] [address] o [phone] o Mon-Fri 8:00-5:00 o Babies seen by [facility] providers o Does NOT accept Medicaid . [place] ([place] [facility] [place]) Bing Neighbors, MD o [address] [place], [place], [place] [address] o [phone] o Mon-Thur 8:00-7:00, Fri 8:00-5:00, Sat 8:00-12:00 o Babies seen by providers at [facility] o Accepting Medicaid - but does not have vaccinations in office (must be received elsewhere) o Limited availability, please call early in hospitalization  [place] ([id number]) . [place], MD o [address], [place] [address] o [phone]  Fax [phone]  Childbirth Education Options: [facility] Department Classes:  Childbirth education classes can help you get ready for a positive parenting experience. You can also meet other expectant parents and get free stuff for your baby. Each class runs for five weeks on the same night and costs $45 for the mother-to-be and her support person. Medicaid covers the cost if you are eligible. Call [phone] to register. [facility] Childbirth Education:  [phone] or [phone] or [email or web address]  Baby & Me Class: Discuss newborn & infant parenting and family adjustment issues with other new mothers in a relaxed environment. Each week brings a new speaker or baby-centered activity. We encourage new mothers to join [place] every Thursday at 11:00am. Babies birth until crawling. No registration or fee. Daddy [employer]: This course offers Dads-to-be the tools and knowledge needed to feel confident on their journey to becoming new fathers. Experienced dads, who have been trained as coaches, teach dads-to-be how to hold, comfort, diaper, swaddle and play with their infant while being able to support the new mom as well. A class for men taught by men. $25/dad Big Brother/Big Sister: Let your children share in the joy of a new brother or sister in this special class designed just for  them. Class includes discussion about how families care for babies: swaddling, holding, diapering, safety as well as how they can be helpful in their new role. This class is designed for children ages 23 to 37, but any age is welcome. Please register each child individually. $5/child  Mom Talk: This mom-led group offers support and connection to mothers as they journey through the adjustments and struggles of that sometimes overwhelming first year after the birth of a child. Tuesdays at 10:00am and Thursdays at 6:00pm. Babies welcome. No registration or fee. Breastfeeding Support Group: This group is a mother-to-mother support circle where moms have the opportunity to share their breastfeeding experiences. A Lactation Consultant is present for questions and concerns. Meets each Tuesday at 11:00am. No fee or registration. Breastfeeding Your Baby: Learn what to expect in the first days of breastfeeding your newborn.  This class  will help you feel more confident with the skills needed to begin your breastfeeding experience. Many new mothers are concerned about breastfeeding after leaving the hospital. This class will also address the most common fears and challenges about breastfeeding during the first few weeks, months and beyond. (call for fee) Comfort Techniques and Tour: This 2 hour interactive class will provide you the opportunity to learn & practice hands-on techniques that can help relieve some of the discomfort of labor and encourage your baby to rotate toward the best position for birth. You and your partner will be able to try a variety of labor positions with birth balls and rebozos as well as practice breathing, relaxation, and visualization techniques. A tour of the [facility] is included with this class. $20 per registrant and support person Childbirth Class- Weekend Option: This class is a Weekend version of our Birth & Baby series. It is designed for parents who have a  difficult time fitting several weeks of classes into their schedule. It covers the care of your newborn and the basics of labor and childbirth. It also includes a [employer] [facility] and lunch. The class is held two consecutive days: beginning on Friday evening from 6:30 - 8:30 p.m. and the next day, Saturday from 9 a.m. - 4 p.m. (call for fee) Doren Custard Class: Interested in a waterbirth?  This informational class will help you discover whether waterbirth is the right fit for you. Education about waterbirth itself, supplies you would need and how to assemble your support team is what you can expect from this class. Some obstetrical practices require this class in order to pursue a waterbirth. (Not all obstetrical practices offer waterbirth-check with your healthcare provider.) Register only the expectant mom, but you are encouraged to bring your partner to class! Required if planning waterbirth, no fee. Infant/Child CPR: Parents, grandparents, babysitters, and friends learn Cardio-Pulmonary Resuscitation skills for infants and children. You will also learn how to treat both conscious and unconscious choking in infants and children. This Family & Friends program does not offer certification. Register each participant individually to ensure that enough mannequins are available. (Call for fee) Grandparent Love: Expecting a grandbaby? This class is for you! Learn about the latest infant care and safety recommendations and ways to support your own child as he or she transitions into the parenting role. Taught by Registered Nurses who are childbirth instructors, but most importantly...they are grandmothers too! $10/person. Childbirth Class- Natural Childbirth: This series of 5 weekly classes is for expectant parents who want to learn and practice natural methods of coping with the process of labor and childbirth. Relaxation, breathing, massage, visualization, role of the partner, and  helpful positioning are highlighted. Participants learn how to be confident in their body's ability to give birth. This class will empower and help parents make informed decisions about their own care. Includes discussion that will help new parents transition into the immediate postpartum period. [facility] is included. We suggest taking this class between 25-32 weeks, but it's only a recommendation. $75 per registrant and one support person or $30 Medicaid. Childbirth Class- 3 week Series: This option of 3 weekly classes helps you and your labor partner prepare for childbirth. Newborn care, labor & birth, cesarean birth, pain management, and comfort techniques are discussed and a [place] [place] is included. The class meets at the same time, on the same day of the week  for 3 consecutive weeks beginning with the starting date you choose. $60 for registrant and one support person.  Marvelous Multiples: Expecting twins, triplets, or more? This class covers the differences in labor, birth, parenting, and breastfeeding issues that face multiples' parents. NICU tour is included. Led by a Certified Childbirth Educator who is the mother of twins. No fee. Caring for Baby: This class is for expectant and adoptive parents who want to learn and practice the most up-to-date newborn care for their babies. Focus is on birth through the first six weeks of life. Topics include feeding, bathing, diapering, crying, umbilical cord care, circumcision care and safe sleep. Parents learn to recognize symptoms of illness and when to call the pediatrician. Register only the mom-to-be and your partner or support person can plan to come with you! $10 per registrant and support person Childbirth Class- online option: This online class offers you the freedom to complete a Birth and Baby series in the comfort of your own home. The flexibility of this option allows you to  review sections at your own pace, at times convenient to you and your support people. It includes additional video information, animations, quizzes, and extended activities. Get organized with helpful eClass tools, checklists, and trackers. Once you register online for the class, you will receive an email within a few days to accept the invitation and begin the class when the time is right for you. The content will be available to you for 60 days. $60 for 60 days of online access for you and your support people.  DOULA LIST   [facility] Doula  [place]  [phone]  [email or web address]  [email or web address]  Zula the [employer] [phone]  [email or web address]   [employer]   Precious Danford Bad   [email or web address]   ??THE MOTHERLY DOULA?? Zola Button   [phone]   [email or web address]     The [facility]undant Life Doula  Olive Bass  [phone]    [email or web address]   [facility]  Angie Rosier     [phone]     [email or web address]   Renato Gails: Doula & Photographer   Renato Gails [phone]       [email or web address]  [email or web address]   [employer] [facility]  Destrehan Mattocks [phone]   [email or web address] [place], [place]  Lolita Rieger  [phone]  [email or web address]   [email or web address]   [facility]   Kizzie Furnish   [phone]  [email or web address]   Dina Rich Boyce Doula  Dina Rich  [phone] [email or web address]  Natural Baby Doulas  Cornelious Bryant         [facility]       Lora Reynolds     [phone] [email or web address]  [email or web address]   [facility]   Splendora Foxx [phone] [email or web address]   [facility]  Doula Services  Camelia Eng     [phone]  [email or web address]  [facility]     [phone]  [email or web address]  Facebook and IG [email or web address] .  [phone] [email or web address] .[phone] [email or web address]   [phone]  [phone] [email or web address]     [facility]  [phone] [email or web address]   IG [email or web address]    [phone]    [phone] [email or web address]   Lurline Hare "[place]" Free  [phone] [email or web address]    Mtende Roll  [phone] [email or web address]   Susie Williams   [email or web address]    [phone]  [phone] [email or web address] .[email or web address] Peach  [phone] [email or web address]    Kizzie Ide  [phone] [email or web address]    [place]    [phone]   [facility] on the Brain Lucky Cowboy  [phone] [email or web address]  Doula Mama Maryjean Ka [phone] [email or web address]  [facility] the Brain Lucky Cowboy  [phone] [email or web address]  [facility] Enzo Montgomery [phone]  [email or web address]  [email or web address] Kell Harris-Jones  [phone] [email or web address] .[email or web address]   Ardine Eng [phone] [email or web address]   Gilda Crease [phone] [email or web address]   Jonelle Sports  [phone] [email or web address]  [employer]  Jackelyn Poling  [phone] [email or web address]   Cristina Gong [phone] [email or web address]  MOOR Divine Myeka Dunn  [email or web address]   Glory Buff [phone] [email or web address]   Whitney Muse [phone] [email or web address]   Eldridge Abrahams [phone] [email or web address]     Considering Waterbirth? Guide for patients at [facility] [employer]  Why consider waterbirth?  . Gentle birth for babies . Less pain medicine used in labor . May allow for passive descent/less pushing . May reduce perineal tears  . More mobility and instinctive maternal position changes . Increased maternal relaxation . Reduced blood pressure in labor  Is waterbirth safe? What are the risks of infection, drowning or other complications?  . Infection: o Very low risk (3.7 % for tub vs 4.8% for bed) o 7 in 8000 waterbirths with documented infection o Poorly cleaned equipment most common cause o Slightly lower group B strep transmission rate  . Drowning o Maternal:  - Very low risk   - Related to seizures or fainting o Newborn:  - Very low risk. No evidence of increased risk of respiratory problems in multiple large studies - Physiological protection from breathing under water - Avoid underwater birth if there are any fetal complications - Once baby's head is out of the water, keep it out.  . Birth complication o Some reports of cord trauma, but risk decreased by bringing baby to surface gradually o No evidence of increased risk of shoulder dystocia. Mothers can usually change positions faster in water than in a bed, possibly aiding the maneuvers to free the shoulder.   You must attend a Linden Dolin class at [employer] by calling [phone] or online at [email or web address] for virtual class  Bring [place] the certificate from the class to your prenatal appointment  Meet with a midwife at 36 weeks to see if you can still plan a waterbirth and to sign the consent.   Things that would prevent you from having a waterbirth:  (+) COVID-19 infection  Premature, <37wks  Previous cesarean birth  Presence of thick meconium-stained fluid  Multiple gestation (Twins, triplets, etc.)  Uncontrolled diabetes or gestational diabetes requiring medication  Hypertension requiring medication or diagnosis  of pre-eclampsia  Heavy vaginal bleeding  Non-reassuring fetal heart rate  Active infection (MRSA, etc.). Group B Strep is NOT a contraindication for waterbirth.  If your labor has to be induced and induction method requires continuous monitoring of the baby's heart rate  Other risks/issues identified by your obstetrical provider  Please remember that birth is unpredictable. Under certain unforeseeable circumstances your provider may advise against giving birth in the tub. These decisions will be made on a case-by-case basis and with the safety of you and your baby as our highest priority.

## 2020-05-27 LAB — CBC/D/PLT+RPR+RH+ABO+RUB AB...
Antibody Screen: NEGATIVE
Basophils Absolute: 0 10*3/uL (ref 0.0–0.2)
Basos: 0 %
EOS (ABSOLUTE): 0.2 10*3/uL (ref 0.0–0.4)
Eos: 3 %
HCV Ab: <0.1 s/co ratio (ref 0.0–0.9)
HIV Screen 4th Generation wRfx: NONREACTIVE
Hematocrit: 36 % (ref 34.0–46.6)
Hemoglobin: 11.9 g/dL (ref 11.1–15.9)
Hepatitis B Surface Ag: NEGATIVE
Immature Grans (Abs): 0 10*3/uL (ref 0.0–0.1)
Immature Granulocytes: 0 %
Lymphocytes Absolute: 2 10*3/uL (ref 0.7–3.1)
Lymphs: 21 %
MCH: 27.1 pg (ref 26.6–33.0)
MCHC: 33.1 g/dL (ref 31.5–35.7)
MCV: 82 fL (ref 79–97)
Monocytes Absolute: 0.5 10*3/uL (ref 0.1–0.9)
Monocytes: 5 %
Neutrophils Absolute: 6.9 10*3/uL (ref 1.4–7.0)
Neutrophils: 71 %
Platelets: 259 10*3/uL (ref 150–450)
RBC: 4.39 x10E6/uL (ref 3.77–5.28)
RDW: 13.2 % (ref 11.7–15.4)
RPR Ser Ql: NONREACTIVE
Rh Factor: POSITIVE
Rubella Antibodies, IGG: 16.8 index (ref >0.99)
WBC: 9.7 10*3/uL (ref 3.4–10.8)

## 2020-05-27 LAB — COMPREHENSIVE METABOLIC PANEL
ALT: 17 IU/L (ref 0–32)
AST: 18 IU/L (ref 0–40)
Albumin/Globulin Ratio: 1.7 (ref 1.2–2.2)
Albumin: 4.1 g/dL (ref 3.9–5.0)
Alkaline Phosphatase: 57 IU/L (ref 44–121)
BUN/Creatinine Ratio: 10 (ref 9–23)
BUN: 5 mg/dL — ABNORMAL LOW (ref 6–20)
Bilirubin Total: 0.4 mg/dL (ref 0.0–1.2)
CO2: 20 mmol/L (ref 20–29)
Calcium: 9.1 mg/dL (ref 8.7–10.2)
Chloride: 101 mmol/L (ref 96–106)
Creatinine, Ser: 0.52 mg/dL — ABNORMAL LOW (ref 0.57–1.00)
GFR calc Af Amer: 150 mL/min/{1.73_m2} (ref >59)
GFR calc non Af Amer: 130 mL/min/{1.73_m2} (ref >59)
Globulin, Total: 2.4 g/dL (ref 1.5–4.5)
Glucose: 82 mg/dL (ref 65–99)
Potassium: 4.1 mmol/L (ref 3.5–5.2)
Sodium: 136 mmol/L (ref 134–144)
Total Protein: 6.5 g/dL (ref 6.0–8.5)

## 2020-05-27 LAB — HEMOGLOBIN A1C
Est. average glucose Bld gHb Est-mCnc: 105 mg/dL
Hgb A1c MFr Bld: 5.3 % (ref 4.8–5.6)

## 2020-05-27 LAB — HCV INTERPRETATION

## 2020-05-29 ENCOUNTER — Inpatient Hospital Stay (HOSPITAL_COMMUNITY): Payer: Medicaid Other

## 2020-05-29 ENCOUNTER — Telehealth: Payer: Self-pay | Admitting: Family Medicine

## 2020-05-29 ENCOUNTER — Encounter (HOSPITAL_COMMUNITY): Payer: Self-pay | Admitting: Obstetrics & Gynecology

## 2020-05-29 ENCOUNTER — Inpatient Hospital Stay (HOSPITAL_COMMUNITY)
Admission: AD | Admit: 2020-05-29 | Discharge: 2020-05-30 | Disposition: A | Discharge status: Home / Self Care | Payer: Medicaid Other | Attending: Obstetrics & Gynecology | Admitting: Obstetrics & Gynecology

## 2020-05-29 ENCOUNTER — Other Ambulatory Visit: Payer: Self-pay

## 2020-05-29 DIAGNOSIS — R0789 Other chest pain: Secondary | ICD-10-CM

## 2020-05-29 DIAGNOSIS — Z885 Allergy status to narcotic agent status: Secondary | ICD-10-CM | POA: Insufficient documentation

## 2020-05-29 DIAGNOSIS — O99891 Other specified diseases and conditions complicating pregnancy: Secondary | ICD-10-CM | POA: Diagnosis not present

## 2020-05-29 DIAGNOSIS — Z3A12 12 weeks gestation of pregnancy: Secondary | ICD-10-CM | POA: Insufficient documentation

## 2020-05-29 DIAGNOSIS — R519 Headache, unspecified: Secondary | ICD-10-CM | POA: Diagnosis not present

## 2020-05-29 DIAGNOSIS — R0602 Shortness of breath: Secondary | ICD-10-CM | POA: Insufficient documentation

## 2020-05-29 DIAGNOSIS — Z87891 Personal history of nicotine dependence: Secondary | ICD-10-CM | POA: Diagnosis not present

## 2020-05-29 DIAGNOSIS — O10919 Unspecified pre-existing hypertension complicating pregnancy, unspecified trimester: Secondary | ICD-10-CM | POA: Insufficient documentation

## 2020-05-29 DIAGNOSIS — R079 Chest pain, unspecified: Secondary | ICD-10-CM | POA: Diagnosis not present

## 2020-05-29 DIAGNOSIS — O26891 Other specified pregnancy related conditions, first trimester: Secondary | ICD-10-CM | POA: Insufficient documentation

## 2020-05-29 DIAGNOSIS — O26899 Other specified pregnancy related conditions, unspecified trimester: Secondary | ICD-10-CM

## 2020-05-29 LAB — COMPREHENSIVE METABOLIC PANEL
ALT: 17 U/L (ref 0–44)
AST: 18 U/L (ref 15–41)
Albumin: 3.2 g/dL — ABNORMAL LOW (ref 3.5–5.0)
Alkaline Phosphatase: 44 U/L (ref 38–126)
Anion gap: 9 (ref 5–15)
BUN: <5 mg/dL — ABNORMAL LOW (ref 6–20)
CO2: 21 mmol/L — ABNORMAL LOW (ref 22–32)
Calcium: 8.4 mg/dL — ABNORMAL LOW (ref 8.9–10.3)
Chloride: 102 mmol/L (ref 98–111)
Creatinine, Ser: 0.53 mg/dL (ref 0.44–1.00)
GFR, Estimated: >60 mL/min (ref >60)
Glucose, Bld: 96 mg/dL (ref 70–99)
Potassium: 3.4 mmol/L — ABNORMAL LOW (ref 3.5–5.1)
Sodium: 132 mmol/L — ABNORMAL LOW (ref 135–145)
Total Bilirubin: 0.5 mg/dL (ref 0.3–1.2)
Total Protein: 6.4 g/dL — ABNORMAL LOW (ref 6.5–8.1)

## 2020-05-29 LAB — URINALYSIS, ROUTINE W REFLEX MICROSCOPIC
Bilirubin Urine: NEGATIVE
Glucose, UA: NEGATIVE mg/dL
Hgb urine dipstick: NEGATIVE
Ketones, ur: NEGATIVE mg/dL
Leukocytes,Ua: NEGATIVE
Nitrite: NEGATIVE
Protein, ur: NEGATIVE mg/dL
Specific Gravity, Urine: 1.006 (ref 1.005–1.030)
pH: 7 (ref 5.0–8.0)

## 2020-05-29 LAB — CBC
HCT: 36.4 % (ref 36.0–46.0)
Hemoglobin: 11.3 g/dL — ABNORMAL LOW (ref 12.0–15.0)
MCH: 26.8 pg (ref 26.0–34.0)
MCHC: 31 g/dL (ref 30.0–36.0)
MCV: 86.5 fL (ref 80.0–100.0)
Platelets: 229 10*3/uL (ref 150–400)
RBC: 4.21 MIL/uL (ref 3.87–5.11)
RDW: 13.7 % (ref 11.5–15.5)
WBC: 11.7 10*3/uL — ABNORMAL HIGH (ref 4.0–10.5)
nRBC: 0 % (ref 0.0–0.2)

## 2020-05-29 LAB — RAPID URINE DRUG SCREEN, HOSP PERFORMED
Amphetamines: NOT DETECTED
Barbiturates: NOT DETECTED
Benzodiazepines: NOT DETECTED
Cocaine: NOT DETECTED
Opiates: NOT DETECTED
Tetrahydrocannabinol: NOT DETECTED

## 2020-05-29 LAB — TROPONIN I (HIGH SENSITIVITY): Troponin I (High Sensitivity): 4 ng/L (ref <18)

## 2020-05-29 MED ORDER — IOHEXOL 350 MG/ML SOLN
75.0000 mL | Freq: Once | INTRAVENOUS | Status: AC | PRN
  Administered 2020-05-29: 23:00:00 75 mL via INTRAVENOUS

## 2020-05-29 MED ORDER — LORAZEPAM 0.5 MG PO TABS
0.5000 mg | ORAL_TABLET | Freq: Once | ORAL | Status: AC
  Administered 2020-05-29: 19:00:00 0.5 mg via ORAL
  Filled 2020-05-29: qty 1

## 2020-05-29 MED ORDER — LIDOCAINE VISCOUS HCL 2 % MT SOLN
15.0000 mL | Freq: Once | OROMUCOSAL | Status: AC
  Administered 2020-05-29: 19:00:00 15 mL via ORAL
  Filled 2020-05-29: qty 15

## 2020-05-29 MED ORDER — ALUM & MAG HYDROXIDE-SIMETH 200-200-20 MG/5ML PO SUSP
30.0000 mL | Freq: Once | ORAL | Status: AC
  Administered 2020-05-29: 19:00:00 30 mL via ORAL
  Filled 2020-05-29: qty 30

## 2020-05-29 MED ORDER — BUTALBITAL-APAP-CAFFEINE 50-325-40 MG PO TABS
1.0000 | ORAL_TABLET | Freq: Once | ORAL | Status: AC
  Administered 2020-05-29: 19:00:00 1 via ORAL
  Filled 2020-05-29: qty 1

## 2020-05-29 NOTE — MAU Note (Signed)
Pt to room via wheelchair with complaints of sharp midsternal pain, shortness of breath while talking and with minimal activity. Headache. Also reports blurred vision and dizziness. No cough or fever. Pt states symptoms started yesterday and have worsened today. Pt reports she is 12.3 weeks with an EDC of 12/09/2020. Pt denies cramping, vaginal bleeding or discharge  BP 143/71 HR 93 Sat 100% Temp 98.1

## 2020-05-29 NOTE — MAU Provider Note (Addendum)
History     CSN: [id number]  Arrival date and time: 05/29/20 1728   Event Date/Time   First Provider Initiated Contact with Patient 05/29/20 1807      Chief Complaint  Patient presents with   Headache   Chest Pain   Shortness of Breath   29 y.o. G2P0010 _0 .2 wks presenting with CP, dizziness, and elevated BP. Reports onset of CP last night. Located substernal. Rates 7/10. Pain is worse with deep inspiration. Has not tried anything for it. Reports feeling dizzy, lightheaded, and weak today. No syncope. She checked her BP and was 159/78. Reports intermittent SOB. States she is eating and drinking today. Endorses frontal HA which has been ongoing since she's been pregnant but worse today. Rates 9/10. Has not tried anything for it. Associated sx are blurry vision. She denies abd pain and VB. She was seen in the ED around 7-8 week and had HTN, denies any previous diagnosis. Also has hx of anxiety and depression, denies any current anxious feeling or recent panic attacks.    OB History    Gravida  2   Para      Term      Preterm      AB  1   Living        SAB  1   IAB      Ectopic      Multiple      Live Births              Past Medical History:  Diagnosis Date   ADHD (attention deficit hyperactivity disorder)    Anxiety    Bipolar 1 disorder (HCC)    BV (bacterial vaginosis)    Chlamydia    Cleft lip    Depression    off meds, has therapist   GERD (gastroesophageal reflux disease)    Headache    PONV (postoperative nausea and vomiting)     Past Surgical History:  Procedure Laterality Date   ANKLE SURGERY  2017   right ankle, has 2 screws due to injury from [place]     total of 4 surgeries   TONSILLECTOMY      Family History  Problem Relation Age of Onset   Hypertension Mother    Bipolar disorder Father    Cleft lip Father    Diabetes Maternal Grandmother    ADD / ADHD Sister    ADD / ADHD Brother     ADD / ADHD Sister    ADD / ADHD Brother     Social History   Tobacco Use   Smoking status: Former Smoker    Packs/day: 0.50    Types: Cigarettes    Quit date: 01/13/2020    Years since quitting: 0.3   Smokeless tobacco: Never Used  Vaping Use   Vaping Use: Former   Quit date: 02/13/2020  Substance Use Topics   Alcohol use: Not Currently    Comment: socially   Drug use: Not Currently    Types: Marijuana    Allergies:  Allergies  Allergen Reactions   Hydrocodone-Acetaminophen Nausea And Vomiting   Nickel Rash   Tramadol Itching    "Makes me itch all over"    Medications Prior to Admission  Medication Sig Dispense Refill Last Dose   Blood Pressure Monitoring (BLOOD PRESSURE KIT) DEVI 1 Device by Does not apply route as needed. 1 each 0 05/29/2020 at Unknown time   Prenatal MV & Min w/FA-DHA (PRENATAL GUMMIES  PO) Take 2 tablets by mouth daily.   05/29/2020 at Unknown time   aspirin EC 81 MG tablet Take 1 tablet (81 mg total) by mouth daily. Swallow whole. 30 tablet 11     Review of Systems  Constitutional: Negative for chills and fever.  Eyes: Positive for visual disturbance.  Respiratory: Negative for cough.   Cardiovascular: Positive for chest pain.  Gastrointestinal: Negative for abdominal pain.  Genitourinary: Negative for vaginal bleeding.  Neurological: Positive for dizziness, light-headedness and headaches. Negative for syncope.   Physical Exam   Blood pressure 129/66, pulse 83, temperature 98.2 F (36.8 C), temperature source Oral, resp. rate 20, height _0  (1.626 m), weight 128.4 kg, last menstrual period 03/04/2020, SpO2 98 %.  Physical Exam Constitutional:      General: She is not in acute distress.    Appearance: Normal appearance.  HENT:     Head: Normocephalic and atraumatic.  Cardiovascular:     Rate and Rhythm: Normal rate and regular rhythm.     Heart sounds: Normal heart sounds.  Pulmonary:     Effort: Pulmonary effort is  normal. No respiratory distress.     Breath sounds: Normal breath sounds. No stridor. No wheezing, rhonchi or rales.  Musculoskeletal:        General: Normal range of motion.     Cervical back: Normal range of motion.  Skin:    General: Skin is warm and dry.  Neurological:     General: No focal deficit present.     Mental Status: She is alert and oriented to person, place, and time.  Psychiatric:        Mood and Affect: Mood is anxious (mild).        Behavior: Behavior normal.    Unable to obtain FHR by doppler Limited bedside US: viable, active fetus, FHR 154 bpm, subj. nml AFV  Results for orders placed or performed during the hospital encounter of 05/29/20 (from the past 24 hour(s))  Urinalysis, Routine w reflex microscopic Urine, Clean Catch     Status: Abnormal   Collection Time: 05/29/20  5:40 PM  Result Value Ref Range   Color, Urine STRAW (A) YELLOW   APPearance CLEAR CLEAR   Specific Gravity, Urine 1.006 1.005 - 1.030   pH 7.0 5.0 - 8.0   Glucose, UA NEGATIVE NEGATIVE mg/dL   Hgb urine dipstick NEGATIVE NEGATIVE   Bilirubin Urine NEGATIVE NEGATIVE   Ketones, ur NEGATIVE NEGATIVE mg/dL   Protein, ur NEGATIVE NEGATIVE mg/dL   Nitrite NEGATIVE NEGATIVE   Leukocytes,Ua NEGATIVE NEGATIVE  Urine rapid drug screen (hosp performed)     Status: None   Collection Time: 05/29/20  5:40 PM  Result Value Ref Range   Opiates NONE DETECTED NONE DETECTED   Cocaine NONE DETECTED NONE DETECTED   Benzodiazepines NONE DETECTED NONE DETECTED   Amphetamines NONE DETECTED NONE DETECTED   Tetrahydrocannabinol NONE DETECTED NONE DETECTED   Barbiturates NONE DETECTED NONE DETECTED  CBC     Status: Abnormal   Collection Time: 05/29/20  7:08 PM  Result Value Ref Range   WBC 11.7 (H) 4.0 - 10.5 K/uL   RBC 4.21 3.87 - 5.11 MIL/uL   Hemoglobin 11.3 (L) 12.0 - 15.0 g/dL   HCT 36.4 36.0 - 46.0 %   MCV 86.5 80.0 - 100.0 fL   MCH 26.8 26.0 - 34.0 pg   MCHC 31.0 30.0 - 36.0 g/dL   RDW 13.7  11.5 - 15.5 %   Platelets 229 150 -  400 K/uL   nRBC 0.0 0.0 - 0.2 %  Comprehensive metabolic panel     Status: Abnormal   Collection Time: 05/29/20  7:08 PM  Result Value Ref Range   Sodium 132 (L) 135 - 145 mmol/L   Potassium 3.4 (L) 3.5 - 5.1 mmol/L   Chloride 102 98 - 111 mmol/L   CO2 21 (L) 22 - 32 mmol/L   Glucose, Bld 96 70 - 99 mg/dL   BUN <5 (L) 6 - 20 mg/dL   Creatinine, Ser 0.53 0.44 - 1.00 mg/dL   Calcium 8.4 (L) 8.9 - 10.3 mg/dL   Total Protein 6.4 (L) 6.5 - 8.1 g/dL   Albumin 3.2 (L) 3.5 - 5.0 g/dL   AST 18 15 - 41 U/L   ALT 17 0 - 44 U/L   Alkaline Phosphatase 44 38 - 126 U/L   Total Bilirubin 0.5 0.3 - 1.2 mg/dL   GFR, Estimated >60 >60 mL/min   Anion gap 9 5 - 15  Troponin I (High Sensitivity)     Status: None   Collection Time: 05/29/20  7:08 PM  Result Value Ref Range   Troponin I (High Sensitivity) 4 <18 ng/L   DG Chest 1 View  Result Date: 05/29/2020 CLINICAL DATA:  Shortness of breath. Pregnant patient in first-trimester pregnancy at [redacted] weeks gestation. EXAM: CHEST  1 VIEW COMPARISON:  Radiograph 10/05/2018 FINDINGS: The cardiomediastinal contours are normal. The lungs are clear. Pulmonary vasculature is normal. No consolidation, pleural effusion, or pneumothorax. No acute osseous abnormalities are seen. IMPRESSION: Negative portable view of the chest. Electronically Signed   By: Keith Rake M.D.   On: 05/29/2020 18:48   CT Angio Chest PE W and/or Wo Contrast  Result Date: 05/29/2020 CLINICAL DATA:  29 year old pregnant female. Concern for pulmonary embolism. EXAM: CT ANGIOGRAPHY CHEST WITH CONTRAST TECHNIQUE: Multidetector CT imaging of the chest was performed using the standard protocol during bolus administration of intravenous contrast. Multiplanar CT image reconstructions and MIPs were obtained to evaluate the vascular anatomy. CONTRAST:  67m OMNIPAQUE IOHEXOL 350 MG/ML SOLN COMPARISON:  Chest CT dated 10/06/2018 and radiograph dated 05/29/2020.  FINDINGS: Cardiovascular: There is no cardiomegaly or pericardial effusion. The thoracic aorta is unremarkable. The origins of the great vessels of the aortic arch appear patent as visualized. Evaluation of the pulmonary arteries is very limited due to suboptimal opacification and timing of the contrast. No large or central pulmonary artery embolus identified. Mediastinum/Nodes: No hilar or mediastinal adenopathy. Mild circumferential thickening of the distal esophagus may be related to underdistention. Mild esophagitis is not excluded clinical correlation is recommended. No mediastinal fluid collection. Lungs/Pleura: The lungs are clear. There is no pleural effusion pneumothorax. The central airways are patent. Upper Abdomen: No acute abnormality. Musculoskeletal: No chest wall abnormality. No acute or significant osseous findings. Review of the MIP images confirms the above findings. IMPRESSION: No acute intrathoracic pathology. No CT evidence of central pulmonary artery embolus. Electronically Signed   By: AAnner CreteM.D.   On: 05/29/2020 23:02   MAU Course  Procedures GI cocktail   MDM Labs and CXR ordered. EKG read by Dr. SRemus Loffler Not orthostatic. No relief of CP after GI cocktail, pt appears slightly anxious, Ativan ordered. No improvememt in CP after Ativan, will order CT to r/o PE. Labs normal. HA improved.  Transfer of care given to DCloyde Reams CRichview 05/29/2020 9:00 PM   Reassessment @ 0005: Received migraine headache cocktail (IVFs with benadryl 25 mg IVP,  metoclopramide 10 mg IVP, Decadron 10 mg IVP) -- reports relief. CP is resolved, H/A is rated 2/10 now and SOB is improved  Reassessment @ 0125:  Patient found snoring in the bed, not aroused by CNM questioning about status of H/A, CP & SOB  Assessment and Plan  Headache in pregnancy, antepartum  - Rx Flexeril 10 mg every 12 hours prn H/A - Information provided on migraine H/A, H/A record   Shortness of  breath during pregnancy  - Information provided on SOB   Other chest pain  - Information provided on chest wall pain and non-specific CP   - Discharge patient - Keep scheduled appt with [facility] on 06/27/2020 - Patient verbalized an understanding of the plan of care and agrees.   Laury Deep, CNM  05/30/2020 12:07 AM

## 2020-05-29 NOTE — MAU Note (Signed)
Pt states her"body feels relaxed but my chest still feels tight". HR 84 stats remain 100% on room air

## 2020-05-29 NOTE — MAU Note (Signed)
Ortho static  HOB down pt supine 123/70 at 1753 Tech in for EKG at 1754  sitting BP 132/56 HR 94 at 1812 Standing x 1 min BP 126/71 HR 96 at 1816 Standing x 3 min BP 117/78 HR 104 at 1818 Pt reports generalized weakness and the "room spinning " while standing Assisted back to bed

## 2020-05-29 NOTE — Telephone Encounter (Signed)
Pt called and stating HBP, symptoms are chest pain, blurred vision, headache

## 2020-05-29 NOTE — Telephone Encounter (Signed)
Pt called and  stating HBP, symptoms are chest pain, blurred vision, headache. I sent an instant message to Diane, but she was with a patient. Pt would like a call ASAP to let her know what to do. Thanks

## 2020-05-29 NOTE — MAU Note (Signed)
Pt remains normotensive and in no apparent distress. Pt states she is feeling increased tightness in her chest. O2 sats 99-100%. M.Bhambri notified

## 2020-05-29 NOTE — MAU Note (Signed)
Pt reports no change-continues to feel stabbing pain sternal-chest area-pt denies radiating to L arm, neck or jaw. O2 sats remain 99-100% Respirations are even and unlabored. Pt reported increased pain when lying supine

## 2020-05-30 ENCOUNTER — Encounter (HOSPITAL_COMMUNITY): Payer: Self-pay | Admitting: Obstetrics & Gynecology

## 2020-05-30 ENCOUNTER — Telehealth: Payer: Self-pay | Admitting: Lactation Services

## 2020-05-30 MED ORDER — DIPHENHYDRAMINE HCL 50 MG/ML IJ SOLN
25.0000 mg | Freq: Once | INTRAMUSCULAR | Status: AC
  Administered 2020-05-30: 25 mg via INTRAVENOUS
  Filled 2020-05-30: qty 1

## 2020-05-30 MED ORDER — LACTATED RINGERS IV BOLUS
1000.0000 mL | Freq: Once | INTRAVENOUS | Status: AC
  Administered 2020-05-30: 1000 mL via INTRAVENOUS

## 2020-05-30 MED ORDER — DEXAMETHASONE SODIUM PHOSPHATE 10 MG/ML IJ SOLN
10.0000 mg | Freq: Once | INTRAMUSCULAR | Status: AC
  Administered 2020-05-30: 01:00:00 10 mg via INTRAVENOUS
  Filled 2020-05-30: qty 1

## 2020-05-30 MED ORDER — METOCLOPRAMIDE HCL 5 MG/ML IJ SOLN
10.0000 mg | Freq: Once | INTRAMUSCULAR | Status: AC
  Administered 2020-05-30: 01:00:00 10 mg via INTRAVENOUS
  Filled 2020-05-30: qty 2

## 2020-05-30 MED ORDER — CYCLOBENZAPRINE HCL 10 MG PO TABS: 10.0000 mg | ORAL_TABLET | Freq: Two times a day (BID) | ORAL | 0 refills | Status: DC | PRN

## 2020-05-30 NOTE — MAU Note (Signed)
Pt reports pain in chest and head have resolved. VSS.

## 2020-05-30 NOTE — Telephone Encounter (Addendum)
Returned patients call. Female initially answered phone and then phone went dead. Tried to call again and patient did not answer.   Patient was seen in the MAU yesterday for her concerns.

## 2020-06-01 ENCOUNTER — Encounter: Payer: Self-pay | Admitting: *Deleted

## 2020-06-06 ENCOUNTER — Encounter: Payer: Self-pay | Admitting: *Deleted

## 2020-06-06 ENCOUNTER — Ambulatory Visit (INDEPENDENT_AMBULATORY_CARE_PROVIDER_SITE_OTHER): Payer: Medicaid Other | Admitting: *Deleted

## 2020-06-06 ENCOUNTER — Other Ambulatory Visit: Payer: Self-pay

## 2020-06-06 ENCOUNTER — Telehealth: Payer: Self-pay | Admitting: *Deleted

## 2020-06-06 ENCOUNTER — Ambulatory Visit: Payer: Self-pay | Admitting: *Deleted

## 2020-06-06 VITALS — BP 136/74 | HR 89 | Ht 64.0 in | Wt 284.2 lb

## 2020-06-06 DIAGNOSIS — O926 Galactorrhea: Secondary | ICD-10-CM

## 2020-06-06 DIAGNOSIS — Z3A13 13 weeks gestation of pregnancy: Secondary | ICD-10-CM

## 2020-06-06 NOTE — Telephone Encounter (Signed)
Message left by Noni Saupe - Maternity Care Coordinator form [facility]. She stated that pt is concerned about possible infection of Lt nipple. I called pt and discussed her concern. Pt stated that she has piercing site on her Lt nipple. She removed the ring/post approximately 6 weeks ago. Since yesterday she has noticed discharge from the nipple which is brown/green/yellow and it is painful. I advised pt that we need to see her in office today. She agreed and stated that she can be here in 30 minutes.

## 2020-06-06 NOTE — Telephone Encounter (Signed)
Patient states her OB called her and can see her now- she will follow up with them.

## 2020-06-06 NOTE — Progress Notes (Signed)
Pt reports having pain in Lt breast/nipple yesterday. She noticed redness and pus like discharge from piercing site. The discharge was brown/green/yellow. Pt has cleaned the area several times yesterday with peroxide and also applied antibiotic ointment. Lt nipple was examined and found to have a small opening which did not have drainage currently. Thressa Sheller in to see pt for exam. Pt was advised to continue to keep the area clean. If the problem worsens she should contact our office. Pt voiced understanding. Pt was noted to have elevated BP - 146/76, however re-check BP - 136/74. She states she has frequent headaches, dizziness and chest pain. Per chart review, pt had evaluation @ MAU for these c/o on 12/13. She was prescribed Flexeril for H/A and has not obtained from pharmacy yet. Pt encouraged to obtain Flexeril and may take Tylenol as well.

## 2020-06-07 NOTE — Progress Notes (Signed)
Patient was assessed and managed by nursing staff during this encounter. I have reviewed the chart and agree with the documentation and plan. I have also made any necessary editorial changes.  Thressa Sheller DNP, CNM  06/07/20  12:23 PM

## 2020-06-12 ENCOUNTER — Telehealth: Payer: Self-pay | Admitting: Obstetrics & Gynecology

## 2020-06-12 NOTE — Telephone Encounter (Signed)
Received a call from the patient stating she is having some pelvic pain at 15 weeks gest. She is requesting a call back.

## 2020-06-13 NOTE — Telephone Encounter (Signed)
Returned patients call. Patient voiced that she is having some on and off pelvic pressure. She reports is comes and goes and is an uncomfortable pressure in the vaginal area and just above. She denies vaginal discharge or bleeding. She reports she is not having much intercourse as it is painful to her during and after sex. Swabs have all been normal.   She is taking Celexa for headaches and is taking as needed for pain. Sometimes she takes Tylenol. She is taking about one a day. She reports daily headaches. Her BP has been 130's/85-89. Today was 136/89. She denies blurred vision or dizziness.    She reports she is not working, she is running errands and lounges around some. She is walking the dogs for some exercise.   Reviewed that she most likely experiencing uterine ligament stretching with growth of baby.   Reviewed to call back with any questions or concerns as needed. She is aware of where to seek emergency care.

## 2020-06-21 ENCOUNTER — Telehealth: Payer: Self-pay | Admitting: Family Medicine

## 2020-06-21 ENCOUNTER — Inpatient Hospital Stay (HOSPITAL_COMMUNITY)
Admission: AD | Admit: 2020-06-21 | Discharge: 2020-06-21 | Disposition: A | Discharge status: Home / Self Care | Payer: Medicaid Other | Attending: Obstetrics & Gynecology | Admitting: Obstetrics & Gynecology

## 2020-06-21 ENCOUNTER — Encounter (HOSPITAL_COMMUNITY): Payer: Self-pay | Admitting: Obstetrics & Gynecology

## 2020-06-21 ENCOUNTER — Other Ambulatory Visit: Payer: Self-pay

## 2020-06-21 DIAGNOSIS — Z3A15 15 weeks gestation of pregnancy: Secondary | ICD-10-CM | POA: Diagnosis not present

## 2020-06-21 DIAGNOSIS — Z885 Allergy status to narcotic agent status: Secondary | ICD-10-CM | POA: Diagnosis not present

## 2020-06-21 DIAGNOSIS — Z888 Allergy status to other drugs, medicaments and biological substances status: Secondary | ICD-10-CM | POA: Insufficient documentation

## 2020-06-21 DIAGNOSIS — J029 Acute pharyngitis, unspecified: Secondary | ICD-10-CM | POA: Diagnosis present

## 2020-06-21 DIAGNOSIS — F419 Anxiety disorder, unspecified: Secondary | ICD-10-CM

## 2020-06-21 DIAGNOSIS — N898 Other specified noninflammatory disorders of vagina: Secondary | ICD-10-CM

## 2020-06-21 DIAGNOSIS — O23592 Infection of other part of genital tract in pregnancy, second trimester: Secondary | ICD-10-CM

## 2020-06-21 DIAGNOSIS — O10919 Unspecified pre-existing hypertension complicating pregnancy, unspecified trimester: Secondary | ICD-10-CM

## 2020-06-21 DIAGNOSIS — Z3490 Encounter for supervision of normal pregnancy, unspecified, unspecified trimester: Secondary | ICD-10-CM

## 2020-06-21 DIAGNOSIS — U071 COVID-19: Secondary | ICD-10-CM

## 2020-06-21 DIAGNOSIS — O98512 Other viral diseases complicating pregnancy, second trimester: Secondary | ICD-10-CM

## 2020-06-21 DIAGNOSIS — Z87891 Personal history of nicotine dependence: Secondary | ICD-10-CM | POA: Diagnosis not present

## 2020-06-21 DIAGNOSIS — O26892 Other specified pregnancy related conditions, second trimester: Secondary | ICD-10-CM | POA: Diagnosis not present

## 2020-06-21 DIAGNOSIS — F3289 Other specified depressive episodes: Secondary | ICD-10-CM

## 2020-06-21 DIAGNOSIS — O99210 Obesity complicating pregnancy, unspecified trimester: Secondary | ICD-10-CM

## 2020-06-21 HISTORY — DX: COVID-19: U07.1

## 2020-06-21 HISTORY — DX: Other viral diseases complicating pregnancy, second trimester: O98.512

## 2020-06-21 LAB — URINALYSIS, ROUTINE W REFLEX MICROSCOPIC
Bilirubin Urine: NEGATIVE
Glucose, UA: NEGATIVE mg/dL
Hgb urine dipstick: NEGATIVE
Ketones, ur: NEGATIVE mg/dL
Leukocytes,Ua: NEGATIVE
Nitrite: NEGATIVE
Protein, ur: NEGATIVE mg/dL
Specific Gravity, Urine: 1.008 (ref 1.005–1.030)
pH: 6 (ref 5.0–8.0)

## 2020-06-21 LAB — WET PREP, GENITAL
Clue Cells Wet Prep HPF POC: NONE SEEN
Sperm: NONE SEEN
Trich, Wet Prep: NONE SEEN
Yeast Wet Prep HPF POC: NONE SEEN

## 2020-06-21 LAB — RESP PANEL BY RT-PCR (FLU A&B, COVID) ARPGX2
Influenza A by PCR: NEGATIVE
Influenza B by PCR: NEGATIVE
SARS Coronavirus 2 by RT PCR: POSITIVE — AB

## 2020-06-21 NOTE — Telephone Encounter (Signed)
Patient called said she had a Raid Covid test today and she tested positive, patient is really worried due to her being pregnant and having a lot of abd pain, patient is requesting a call

## 2020-06-21 NOTE — MAU Note (Signed)
Unable to doppler pt due to habitus. Bedside U/S performed by provider.

## 2020-06-21 NOTE — MAU Note (Addendum)
Pt exposed to Covid at Christmas. Was positive at [facility] today. Has results on her phone.  Has cough, sore throat and SOB. Also coughing up mucous. States she's had mucous/bloody vaginal discharge since yesterday.
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
## 2020-06-22 LAB — GC/CHLAMYDIA PROBE AMP (~~LOC~~) NOT AT ARMC
Chlamydia: NEGATIVE
Comment: NEGATIVE
Comment: NORMAL
Neisseria Gonorrhea: NEGATIVE

## 2020-06-22 NOTE — Telephone Encounter (Signed)
Returned patients call. Patient reports + Covid Test yesterday. She reports URI symptoms about 7-8 days ago. Patient has not had her Covid Vaccine.   She reports she is feeling better with the cold. She denies diarrhea or vomiting. She had a bowel movement this morning. No UTI Symptoms per patient. She denies fever.   She reports a bad sharp stabbing pain every 10-15 minutes for about 1 minute for about 2 days to the abdomen. She reports a thick vaginal mucous discharge that is yellow with some light pink discharge. This started about 2 days ago. She denies itching or burning. She is not able to detect an odor as her nose is stuffy.   Patient has an appt in the office on 1/11. Reviewed she has to be 10 days post symptoms starting to come to office for appt, she will be about 14 days post symptoms starting.   She is taking Tylenol for the Headache and is helping some.   Reviewed stretching ligaments with patient and that some people are experiencing abdominal pain with COVID.   Reviewed case with Dr. Hyacinth Meeker, she reports patient can have swab in the office on Tuesday for her appt. Otherwise rest and hydration.   email to MAB Hotline at [facility] sent to see if patient qualifies for antibody infusion. Reviewed with patient that not all qualify and she will be called for infusion if she qualifies.   Reviewed is pain become severe, longer lasting or intolerable or has bleeding like a period, she is to go to the MAU for evaluation.

## 2020-06-23 ENCOUNTER — Telehealth: Payer: Self-pay | Admitting: General Practice

## 2020-06-23 ENCOUNTER — Other Ambulatory Visit: Payer: Self-pay

## 2020-06-23 ENCOUNTER — Inpatient Hospital Stay (HOSPITAL_COMMUNITY)
Admission: AD | Admit: 2020-06-23 | Discharge: 2020-06-23 | Disposition: A | Discharge status: Home / Self Care | Payer: Medicaid Other | Attending: Obstetrics and Gynecology | Admitting: Obstetrics and Gynecology

## 2020-06-23 ENCOUNTER — Encounter (HOSPITAL_COMMUNITY): Payer: Self-pay | Admitting: Obstetrics and Gynecology

## 2020-06-23 ENCOUNTER — Inpatient Hospital Stay (HOSPITAL_BASED_OUTPATIENT_CLINIC_OR_DEPARTMENT_OTHER): Payer: Medicaid Other

## 2020-06-23 DIAGNOSIS — E669 Obesity, unspecified: Secondary | ICD-10-CM | POA: Diagnosis not present

## 2020-06-23 DIAGNOSIS — O4690 Antepartum hemorrhage, unspecified, unspecified trimester: Secondary | ICD-10-CM

## 2020-06-23 DIAGNOSIS — Z87891 Personal history of nicotine dependence: Secondary | ICD-10-CM | POA: Diagnosis not present

## 2020-06-23 DIAGNOSIS — O99212 Obesity complicating pregnancy, second trimester: Secondary | ICD-10-CM

## 2020-06-23 DIAGNOSIS — O26892 Other specified pregnancy related conditions, second trimester: Secondary | ICD-10-CM

## 2020-06-23 DIAGNOSIS — Z7982 Long term (current) use of aspirin: Secondary | ICD-10-CM | POA: Insufficient documentation

## 2020-06-23 DIAGNOSIS — Z3A15 15 weeks gestation of pregnancy: Secondary | ICD-10-CM | POA: Diagnosis not present

## 2020-06-23 DIAGNOSIS — N949 Unspecified condition associated with female genital organs and menstrual cycle: Secondary | ICD-10-CM

## 2020-06-23 DIAGNOSIS — O209 Hemorrhage in early pregnancy, unspecified: Secondary | ICD-10-CM | POA: Diagnosis present

## 2020-06-23 DIAGNOSIS — O4692 Antepartum hemorrhage, unspecified, second trimester: Secondary | ICD-10-CM

## 2020-06-23 DIAGNOSIS — O10919 Unspecified pre-existing hypertension complicating pregnancy, unspecified trimester: Secondary | ICD-10-CM

## 2020-06-23 DIAGNOSIS — Z3490 Encounter for supervision of normal pregnancy, unspecified, unspecified trimester: Secondary | ICD-10-CM

## 2020-06-23 DIAGNOSIS — Z885 Allergy status to narcotic agent status: Secondary | ICD-10-CM | POA: Diagnosis not present

## 2020-06-23 DIAGNOSIS — O99210 Obesity complicating pregnancy, unspecified trimester: Secondary | ICD-10-CM

## 2020-06-23 DIAGNOSIS — F419 Anxiety disorder, unspecified: Secondary | ICD-10-CM

## 2020-06-23 DIAGNOSIS — O10012 Pre-existing essential hypertension complicating pregnancy, second trimester: Secondary | ICD-10-CM | POA: Diagnosis not present

## 2020-06-23 DIAGNOSIS — R109 Unspecified abdominal pain: Secondary | ICD-10-CM

## 2020-06-23 DIAGNOSIS — F3289 Other specified depressive episodes: Secondary | ICD-10-CM

## 2020-06-23 LAB — CBC
HCT: 36.8 % (ref 36.0–46.0)
Hemoglobin: 11.7 g/dL — ABNORMAL LOW (ref 12.0–15.0)
MCH: 26.5 pg (ref 26.0–34.0)
MCHC: 31.8 g/dL (ref 30.0–36.0)
MCV: 83.3 fL (ref 80.0–100.0)
Platelets: 219 10*3/uL (ref 150–400)
RBC: 4.42 MIL/uL (ref 3.87–5.11)
RDW: 13.4 % (ref 11.5–15.5)
WBC: 10.4 10*3/uL (ref 4.0–10.5)
nRBC: 0 % (ref 0.0–0.2)

## 2020-06-23 MED ORDER — ACETAMINOPHEN 500 MG PO TABS
1000.0000 mg | ORAL_TABLET | Freq: Once | ORAL | Status: AC
  Administered 2020-06-23: 1000 mg via ORAL
  Filled 2020-06-23: qty 2

## 2020-06-23 NOTE — Telephone Encounter (Signed)
Patient called into front office requesting a callback due to new onset vaginal bleeding. Called patient back and she states she just spoke with after hours nurse and is currently on her way to the hospital. Patient reports worsening pain since MAU visit on 1/5. States pain is happening more consistently and feels like stabbing. She also woke up this morning and started to have bleeding mucous discharge. Patient asked I call MAU and let them know she is coming. Told patient I would. Patient had no other questions. MAU called and notified.

## 2020-06-23 NOTE — Discharge Instructions (Signed)
Round Ligament Pain  The round ligament is a cord of muscle and tissue that helps support the uterus. It can become a source of pain during pregnancy if it becomes stretched or twisted as the baby grows. The pain usually begins in the second trimester (13-28 weeks) of pregnancy, and it can come and go until the baby is delivered. It is not a serious problem, and it does not cause harm to the baby. Round ligament pain is usually a short, sharp, and pinching pain, but it can also be a dull, lingering, and aching pain. The pain is felt in the lower side of the abdomen or in the groin. It usually starts deep in the groin and moves up to the outside of the hip area. The pain may occur when you:  Suddenly change position, such as quickly going from a sitting to standing position.  Roll over in bed.  Cough or sneeze.  Do physical activity. Follow these instructions at home:   Watch your condition for any changes.  When the pain starts, relax. Then try any of these methods to help with the pain: ? Sitting down. ? Flexing your knees up to your abdomen. ? Lying on your side with one pillow under your abdomen and another pillow between your legs. ? Sitting in a warm bath for 15-20 minutes or until the pain goes away.  Take over-the-counter and prescription medicines only as told by your health care provider.  Move slowly when you sit down or stand up.  Avoid long walks if they cause pain.  Stop or reduce your physical activities if they cause pain.  Keep all follow-up visits as told by your health care provider. This is important. Contact a health care provider if:  Your pain does not go away with treatment.  You feel pain in your back that you did not have before.  Your medicine is not helping. Get help right away if:  You have a fever or chills.  You develop uterine contractions.  You have vaginal bleeding.  You have nausea or vomiting.  You have diarrhea.  You have pain  when you urinate. Summary  Round ligament pain is felt in the lower abdomen or groin. It is usually a short, sharp, and pinching pain. It can also be a dull, lingering, and aching pain.  This pain usually begins in the second trimester (13-28 weeks). It occurs because the uterus is stretching with the growing baby, and it is not harmful to the baby.  You may notice the pain when you suddenly change position, when you cough or sneeze, or during physical activity.  Relaxing, flexing your knees to your abdomen, lying on one side, or taking a warm bath may help to get rid of the pain.  Get help from your health care provider if the pain does not go away or if you have vaginal bleeding, nausea, vomiting, diarrhea, or painful urination. This information is not intended to replace advice given to you by your health care provider. Make sure you discuss any questions you have with your health care provider. Document Revised: 11/19/2017 Document Reviewed: 11/19/2017 Elsevier Patient Education  2020 [employer].        Second Trimester of Pregnancy The second trimester is from week 14 through week 27 (months 4 through 6). The second trimester is often a time when you feel your best. Your body has adjusted to being pregnant, and you begin to feel better physically. Usually, morning sickness has lessened  or quit completely, you may have more energy, and you may have an increase in appetite. The second trimester is also a time when the fetus is growing rapidly. At the end of the sixth month, the fetus is about 9 inches long and weighs about 1 pounds. You will likely begin to feel the baby move (quickening) between 16 and 20 weeks of pregnancy. Body changes during your second trimester Your body continues to go through many changes during your second trimester. The changes vary from woman to woman.  Your weight will continue to increase. You will notice your lower abdomen bulging out.  You may begin  to get stretch marks on your hips, abdomen, and breasts.  You may develop headaches that can be relieved by medicines. The medicines should be approved by your health care provider.  You may urinate more often because the fetus is pressing on your bladder.  You may develop or continue to have heartburn as a result of your pregnancy.  You may develop constipation because certain hormones are causing the muscles that push waste through your intestines to slow down.  You may develop hemorrhoids or swollen, bulging veins (varicose veins).  You may have back pain. This is caused by: ? Weight gain. ? Pregnancy hormones that are relaxing the joints in your pelvis. ? A shift in weight and the muscles that support your balance.  Your breasts will continue to grow and they will continue to become tender.  Your gums may bleed and may be sensitive to brushing and flossing.  Dark spots or blotches (chloasma, mask of pregnancy) may develop on your face. This will likely fade after the baby is born.  A dark line from your belly button to the pubic area (linea nigra) may appear. This will likely fade after the baby is born.  You may have changes in your hair. These can include thickening of your hair, rapid growth, and changes in texture. Some women also have hair loss during or after pregnancy, or hair that feels dry or thin. Your hair will most likely return to normal after your baby is born. What to expect at prenatal visits During a routine prenatal visit:  You will be weighed to make sure you and the fetus are growing normally.  Your blood pressure will be taken.  Your abdomen will be measured to track your baby's growth.  The fetal heartbeat will be listened to.  Any test results from the previous visit will be discussed. Your health care provider may ask you:  How you are feeling.  If you are feeling the baby move.  If you have had any abnormal symptoms, such as leaking fluid,  bleeding, severe headaches, or abdominal cramping.  If you are using any tobacco products, including cigarettes, chewing tobacco, and electronic cigarettes.  If you have any questions. Other tests that may be performed during your second trimester include:  Blood tests that check for: ? Low iron levels (anemia). ? High blood sugar that affects pregnant women (gestational diabetes) between 20 and 28 weeks. ? Rh antibodies. This is to check for a protein on red blood cells (Rh factor).  Urine tests to check for infections, diabetes, or protein in the urine.  An ultrasound to confirm the proper growth and development of the baby.  An amniocentesis to check for possible genetic problems.  Fetal screens for spina bifida and Down syndrome.  HIV (human immunodeficiency virus) testing. Routine prenatal testing includes screening for HIV, unless you choose  not to have this test. Follow these instructions at home: Medicines  Follow your health care provider's instructions regarding medicine use. Specific medicines may be either safe or unsafe to take during pregnancy.  Take a prenatal vitamin that contains at least 600 micrograms (mcg) of folic acid.  If you develop constipation, try taking a stool softener if your health care provider approves. Eating and drinking   Eat a balanced diet that includes fresh fruits and vegetables, whole grains, good sources of protein such as meat, eggs, or tofu, and low-fat dairy. Your health care provider will help you determine the amount of weight gain that is right for you.  Avoid raw meat and uncooked cheese. These carry germs that can cause birth defects in the baby.  If you have low calcium intake from food, talk to your health care provider about whether you should take a daily calcium supplement.  Limit foods that are high in fat and processed sugars, such as fried and sweet foods.  To prevent constipation: ? Drink enough fluid to keep your  urine clear or pale yellow. ? Eat foods that are high in fiber, such as fresh fruits and vegetables, whole grains, and beans. Activity  Exercise only as directed by your health care provider. Most women can continue their usual exercise routine during pregnancy. Try to exercise for 30 minutes at least 5 days a week. Stop exercising if you experience uterine contractions.  Avoid heavy lifting, wear low heel shoes, and practice good posture.  A sexual relationship may be continued unless your health care provider directs you otherwise. Relieving pain and discomfort  Wear a good support bra to prevent discomfort from breast tenderness.  Take warm sitz baths to soothe any pain or discomfort caused by hemorrhoids. Use hemorrhoid cream if your health care provider approves.  Rest with your legs elevated if you have leg cramps or low back pain.  If you develop varicose veins, wear support hose. Elevate your feet for 15 minutes, 3-4 times a day. Limit salt in your diet. Prenatal Care  Write down your questions. Take them to your prenatal visits.  Keep all your prenatal visits as told by your health care provider. This is important. Safety  Wear your seat belt at all times when driving.  Make a list of emergency phone numbers, including numbers for family, friends, the hospital, and police and fire departments. General instructions  Ask your health care provider for a referral to a local prenatal education class. Begin classes no later than the beginning of month 6 of your pregnancy.  Ask for help if you have counseling or nutritional needs during pregnancy. Your health care provider can offer advice or refer you to specialists for help with various needs.  Do not use hot tubs, steam rooms, or saunas.  Do not douche or use tampons or scented sanitary pads.  Do not cross your legs for long periods of time.  Avoid cat litter boxes and soil used by cats. These carry germs that can cause  birth defects in the baby and possibly loss of the fetus by miscarriage or stillbirth.  Avoid all smoking, herbs, alcohol, and unprescribed drugs. Chemicals in these products can affect the formation and growth of the baby.  Do not use any products that contain nicotine or tobacco, such as cigarettes and e-cigarettes. If you need help quitting, ask your health care provider.  Visit your dentist if you have not gone yet during your pregnancy. Use a soft toothbrush  to brush your teeth and be gentle when you floss. Contact a health care provider if:  You have dizziness.  You have mild pelvic cramps, pelvic pressure, or nagging pain in the abdominal area.  You have persistent nausea, vomiting, or diarrhea.  You have a bad smelling vaginal discharge.  You have pain when you urinate. Get help right away if:  You have a fever.  You are leaking fluid from your vagina.  You have spotting or bleeding from your vagina.  You have severe abdominal cramping or pain.  You have rapid weight gain or weight loss.  You have shortness of breath with chest pain.  You notice sudden or extreme swelling of your face, hands, ankles, feet, or legs.  You have not felt your baby move in over an hour.  You have severe headaches that do not go away when you take medicine.  You have vision changes. Summary  The second trimester is from week 14 through week 27 (months 4 through 6). It is also a time when the fetus is growing rapidly.  Your body goes through many changes during pregnancy. The changes vary from woman to woman.  Avoid all smoking, herbs, alcohol, and unprescribed drugs. These chemicals affect the formation and growth your baby.  Do not use any tobacco products, such as cigarettes, chewing tobacco, and e-cigarettes. If you need help quitting, ask your health care provider.  Contact your health care provider if you have any questions. Keep all prenatal visits as told by your health care  provider. This is important. This information is not intended to replace advice given to you by your health care provider. Make sure you discuss any questions you have with your health care provider. Document Revised: 09/25/2018 Document Reviewed: 07/09/2016 Elsevier Patient Education  [place] of Pregnancy The third trimester is from week 28 through week 40 (months 7 through 9). The third trimester is a time when the unborn baby (fetus) is growing rapidly. At the end of the ninth month, the fetus is about 20 inches in length and weighs 6-10 pounds. Body changes during your third trimester Your body will continue to go through many changes during pregnancy. The changes vary from woman to woman. During the third trimester:  Your weight will continue to increase. You can expect to gain 25-35 pounds (11-16 kg) by the end of the pregnancy.  You may begin to get stretch marks on your hips, abdomen, and breasts.  You may urinate more often because the fetus is moving lower into your pelvis and pressing on your bladder.  You may develop or continue to have heartburn. This is caused by increased hormones that slow down muscles in the digestive tract.  You may develop or continue to have constipation because increased hormones slow digestion and cause the muscles that push waste through your intestines to relax.  You may develop hemorrhoids. These are swollen veins (varicose veins) in the rectum that can itch or be painful.  You may develop swollen, bulging veins (varicose veins) in your legs.  You may have increased body aches in the pelvis, back, or thighs. This is due to weight gain and increased hormones that are relaxing your joints.  You may have changes in your hair. These can include thickening of your hair, rapid growth, and changes in texture. Some women also have hair loss during or after pregnancy, or hair that feels dry or thin. Your hair  will  most likely return to normal after your baby is born.  Your breasts will continue to grow and they will continue to become tender. A yellow fluid (colostrum) may leak from your breasts. This is the first milk you are producing for your baby.  Your belly button may stick out.  You may notice more swelling in your hands, face, or ankles.  You may have increased tingling or numbness in your hands, arms, and legs. The skin on your belly may also feel numb.  You may feel short of breath because of your expanding uterus.  You may have more problems sleeping. This can be caused by the size of your belly, increased need to urinate, and an increase in your body's metabolism.  You may notice the fetus "dropping," or moving lower in your abdomen (lightening).  You may have increased vaginal discharge.  You may notice your joints feel loose and you may have pain around your pelvic bone. What to expect at prenatal visits You will have prenatal exams every 2 weeks until week 36. Then you will have weekly prenatal exams. During a routine prenatal visit:  You will be weighed to make sure you and the baby are growing normally.  Your blood pressure will be taken.  Your abdomen will be measured to track your baby's growth.  The fetal heartbeat will be listened to.  Any test results from the previous visit will be discussed.  You may have a cervical check near your due date to see if your cervix has softened or thinned (effaced).  You will be tested for Group B streptococcus. This happens between 35 and 37 weeks. Your health care provider may ask you:  What your birth plan is.  How you are feeling.  If you are feeling the baby move.  If you have had any abnormal symptoms, such as leaking fluid, bleeding, severe headaches, or abdominal cramping.  If you are using any tobacco products, including cigarettes, chewing tobacco, and electronic cigarettes.  If you have any questions. Other tests  or screenings that may be performed during your third trimester include:  Blood tests that check for low iron levels (anemia).  Fetal testing to check the health, activity level, and growth of the fetus. Testing is done if you have certain medical conditions or if there are problems during the pregnancy.  Nonstress test (NST). This test checks the health of your baby to make sure there are no signs of problems, such as the baby not getting enough oxygen. During this test, a belt is placed around your belly. The baby is made to move, and its heart rate is monitored during movement. What is false labor? False labor is a condition in which you feel small, irregular tightenings of the muscles in the womb (contractions) that usually go away with rest, changing position, or drinking water. These are called Braxton Hicks contractions. Contractions may last for hours, days, or even weeks before true labor sets in. If contractions come at regular intervals, become more frequent, increase in intensity, or become painful, you should see your health care provider. What are the signs of labor?  Abdominal cramps.  Regular contractions that start at 10 minutes apart and become stronger and more frequent with time.  Contractions that start on the top of the uterus and spread down to the lower abdomen and back.  Increased pelvic pressure and dull back pain.  A watery or bloody mucus discharge that comes from the vagina.  Leaking  of amniotic fluid. This is also known as your "water breaking." It could be a slow trickle or a gush. Let your health care provider know if it has a color or strange odor. If you have any of these signs, call your health care provider right away, even if it is before your due date. Follow these instructions at home: Medicines  Follow your health care provider's instructions regarding medicine use. Specific medicines may be either safe or unsafe to take during pregnancy.  Take a  prenatal vitamin that contains at least 600 micrograms (mcg) of folic acid.  If you develop constipation, try taking a stool softener if your health care provider approves. Eating and drinking   Eat a balanced diet that includes fresh fruits and vegetables, whole grains, good sources of protein such as meat, eggs, or tofu, and low-fat dairy. Your health care provider will help you determine the amount of weight gain that is right for you.  Avoid raw meat and uncooked cheese. These carry germs that can cause birth defects in the baby.  If you have low calcium intake from food, talk to your health care provider about whether you should take a daily calcium supplement.  Eat four or five small meals rather than three large meals a day.  Limit foods that are high in fat and processed sugars, such as fried and sweet foods.  To prevent constipation: ? Drink enough fluid to keep your urine clear or pale yellow. ? Eat foods that are high in fiber, such as fresh fruits and vegetables, whole grains, and beans. Activity  Exercise only as directed by your health care provider. Most women can continue their usual exercise routine during pregnancy. Try to exercise for 30 minutes at least 5 days a week. Stop exercising if you experience uterine contractions.  Avoid heavy lifting.  Do not exercise in extreme heat or humidity, or at high altitudes.  Wear low-heel, comfortable shoes.  Practice good posture.  You may continue to have sex unless your health care provider tells you otherwise. Relieving pain and discomfort  Take frequent breaks and rest with your legs elevated if you have leg cramps or low back pain.  Take warm sitz baths to soothe any pain or discomfort caused by hemorrhoids. Use hemorrhoid cream if your health care provider approves.  Wear a good support bra to prevent discomfort from breast tenderness.  If you develop varicose veins: ? Wear support pantyhose or compression  stockings as told by your healthcare provider. ? Elevate your feet for 15 minutes, 3-4 times a day. Prenatal care  Write down your questions. Take them to your prenatal visits.  Keep all your prenatal visits as told by your health care provider. This is important. Safety  Wear your seat belt at all times when driving.  Make a list of emergency phone numbers, including numbers for family, friends, the hospital, and police and fire departments. General instructions  Avoid cat litter boxes and soil used by cats. These carry germs that can cause birth defects in the baby. If you have a cat, ask someone to clean the litter box for you.  Do not travel far distances unless it is absolutely necessary and only with the approval of your health care provider.  Do not use hot tubs, steam rooms, or saunas.  Do not drink alcohol.  Do not use any products that contain nicotine or tobacco, such as cigarettes and e-cigarettes. If you need help quitting, ask your health care provider.  Do not use any medicinal herbs or unprescribed drugs. These chemicals affect the formation and growth of the baby.  Do not douche or use tampons or scented sanitary pads.  Do not cross your legs for long periods of time.  To prepare for the arrival of your baby: ? Take prenatal classes to understand, practice, and ask questions about labor and delivery. ? Make a trial run to the hospital. ? Visit the hospital and tour the maternity area. ? Arrange for maternity or paternity leave through employers. ? Arrange for family and friends to take care of pets while you are in the hospital. ? Purchase a rear-facing car seat and make sure you know how to install it in your car. ? Pack your hospital bag. ? Prepare the baby's nursery. Make sure to remove all pillows and stuffed animals from the baby's crib to prevent suffocation.  Visit your dentist if you have not gone during your pregnancy. Use a soft toothbrush to brush  your teeth and be gentle when you floss. Contact a health care provider if:  You are unsure if you are in labor or if your water has broken.  You become dizzy.  You have mild pelvic cramps, pelvic pressure, or nagging pain in your abdominal area.  You have lower back pain.  You have persistent nausea, vomiting, or diarrhea.  You have an unusual or bad smelling vaginal discharge.  You have pain when you urinate. Get help right away if:  Your water breaks before 37 weeks.  You have regular contractions less than 5 minutes apart before 37 weeks.  You have a fever.  You are leaking fluid from your vagina.  You have spotting or bleeding from your vagina.  You have severe abdominal pain or cramping.  You have rapid weight loss or weight gain.  You have shortness of breath with chest pain.  You notice sudden or extreme swelling of your face, hands, ankles, feet, or legs.  Your baby makes fewer than 10 movements in 2 hours.  You have severe headaches that do not go away when you take medicine.  You have vision changes. Summary  The third trimester is from week 28 through week 40, months 7 through 9. The third trimester is a time when the unborn baby (fetus) is growing rapidly.  During the third trimester, your discomfort may increase as you and your baby continue to gain weight. You may have abdominal, leg, and back pain, sleeping problems, and an increased need to urinate.  During the third trimester your breasts will keep growing and they will continue to become tender. A yellow fluid (colostrum) may leak from your breasts. This is the first milk you are producing for your baby.  False labor is a condition in which you feel small, irregular tightenings of the muscles in the womb (contractions) that eventually go away. These are called Braxton Hicks contractions. Contractions may last for hours, days, or even weeks before true labor sets in.  Signs of labor can include:  abdominal cramps; regular contractions that start at 10 minutes apart and become stronger and more frequent with time; watery or bloody mucus discharge that comes from the vagina; increased pelvic pressure and dull back pain; and leaking of amniotic fluid. This information is not intended to replace advice given to you by your health care provider. Make sure you discuss any questions you have with your health care provider. Document Revised: 09/24/2018 Document Reviewed: 07/09/2016 Elsevier Patient Education  2020 [employer]                        Safe Medications in Pregnancy    Acne: Benzoyl Peroxide Salicylic Acid  Backache/Headache: Tylenol: 2 regular strength every 4 hours OR              2 Extra strength every 6 hours  Colds/Coughs/Allergies: Benadryl (alcohol free) 25 mg every 6 hours as needed Breath right strips Claritin Cepacol throat lozenges Chloraseptic throat spray Cold-Eeze- up to three times per day Cough drops, alcohol free Flonase (by prescription only) Guaifenesin Mucinex Robitussin DM (plain only, alcohol free) Saline nasal spray/drops Sudafed (pseudoephedrine) & Actifed ** use only after [redacted] weeks gestation and if you do not have high blood pressure Tylenol Vicks Vaporub Zinc lozenges Zyrtec   Constipation: Colace Ducolax suppositories Fleet enema Glycerin suppositories Metamucil Milk of magnesia Miralax Senokot Smooth move tea  Diarrhea: Kaopectate Imodium A-D  *NO pepto Bismol  Hemorrhoids: Anusol Anusol HC Preparation H Tucks  Indigestion: Tums Maalox Mylanta Zantac  Pepcid  Insomnia: Benadryl (alcohol free) 25mg  every 6 hours as needed Tylenol PM Unisom, no Gelcaps  Leg Cramps: Tums MagGel  Nausea/Vomiting:  Bonine Dramamine Emetrol Ginger extract Sea bands Meclizine  Nausea medication to take during pregnancy:  Unisom (doxylamine succinate 25 mg tablets) Take one tablet daily at bedtime. If symptoms  are not adequately controlled, the dose can be increased to a maximum recommended dose of two tablets daily (1/2 tablet in the morning, 1/2 tablet mid-afternoon and one at bedtime). Vitamin B6 100mg  tablets. Take one tablet twice a day (up to 200 mg per day).  Skin Rashes: Aveeno products Benadryl cream or 25mg  every 6 hours as needed Calamine Lotion 1% cortisone cream  Yeast infection: Gyne-lotrimin 7 Monistat 7   **If taking multiple medications, please check labels to avoid duplicating the same active ingredients **take medication as directed on the label ** Do not exceed 4000 mg of tylenol in 24 hours **Do not take medications that contain aspirin or ibuprofen
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
## 2020-06-23 NOTE — MAU Note (Signed)
Pt here 2 days ago for  Vaginal discharge. Told it was normal and sent home. Pt reports she hs increased vaginal discharge and some blood in it today. C/o of mild ad cramping as well.

## 2020-06-27 ENCOUNTER — Other Ambulatory Visit: Payer: Self-pay

## 2020-06-27 ENCOUNTER — Ambulatory Visit (INDEPENDENT_AMBULATORY_CARE_PROVIDER_SITE_OTHER): Payer: Medicaid Other | Admitting: Advanced Practice Midwife

## 2020-06-27 VITALS — BP 131/86 | HR 88 | Wt 283.5 lb

## 2020-06-27 DIAGNOSIS — U071 COVID-19: Secondary | ICD-10-CM

## 2020-06-27 DIAGNOSIS — R0981 Nasal congestion: Secondary | ICD-10-CM

## 2020-06-27 DIAGNOSIS — O10919 Unspecified pre-existing hypertension complicating pregnancy, unspecified trimester: Secondary | ICD-10-CM

## 2020-06-27 DIAGNOSIS — Z3490 Encounter for supervision of normal pregnancy, unspecified, unspecified trimester: Secondary | ICD-10-CM

## 2020-06-27 DIAGNOSIS — Z3009 Encounter for other general counseling and advice on contraception: Secondary | ICD-10-CM

## 2020-06-27 DIAGNOSIS — F32A Depression, unspecified: Secondary | ICD-10-CM

## 2020-06-27 MED ORDER — LORATADINE 10 MG PO TABS: 10.0000 mg | ORAL_TABLET | Freq: Every day | ORAL | 0 refills | Status: DC

## 2020-06-27 NOTE — Patient Instructions (Signed)

## 2020-06-27 NOTE — Progress Notes (Signed)
PRENATAL VISIT NOTE  Subjective:  Alyssa Ray is a 30 y.o. G2P0010 at [redacted]w[redacted]d being seen today for ongoing prenatal care.  She is currently monitored for the following issues for this low-risk pregnancy and has ADHD (attention deficit hyperactivity disorder); Obesity in pregnancy; Supervision of low-risk pregnancy; Depression; Anxiety; and Chronic hypertension in pregnancy on their problem list.  Patient reports recurrent sinus congestion and drainage in the setting of recent covid infection. She denies SOB, chest pain, palpitations, weakness, fever. She is attempting to manage her congestion with warm moist air and saline nasal spray.  She typically prefers to limit medication but is open to discussion of sypmtom based treatments..  Contractions: Not present. Vag. Bleeding: None.  Movement: Absent. Denies leaking of fluid.   Patient states she thought she would be seeing Dr. Magnus Sinning for her appointments. She did not realize she would not see same Provider for all prenatal appointments. She "prefers consistency" but declines offer to try to keep most appointments by specific provider. Does prefer female Providers for all visits and during care at birth.  The following portions of the patient's history were reviewed and updated as appropriate: allergies, current medications, past family history, past medical history, past social history, past surgical history and problem list. Problem list updated.  Objective:   Vitals:   06/27/20 0945  BP: 131/86  Pulse: 88  Weight: 283 lb 8 oz (128.6 kg)    Fetal Status: Fetal Heart Rate (bpm): 150   Movement: Absent     General:  Alert, oriented and cooperative. Patient is in no acute distress.  Skin: Skin is warm and dry. No rash noted.   Cardiovascular: Normal heart rate noted  Respiratory: Normal respiratory effort, no problems with respiration noted  Abdomen: Soft, gravid, appropriate for gestational age.  Pain/Pressure: Present     Pelvic:  Cervical exam deferred        Extremities: Normal range of motion.  Edema: Trace  Mental Status: Normal mood and affect. Normal behavior. Normal judgment and thought content.   Assessment and Plan:  Pregnancy: G2P0010 at [redacted]w[redacted]d  1. Encounter for supervision of low-risk pregnancy, antepartum - Routine care, no longer desires discussion of waterbirth - Discussed size of Faculty Practice, unable to special patients via a single Provider. Declined my offer to see Harlon Flor, PA when available - Advised exploring office visits with 3-4 Providers to preserve consistency - Declines AFP today  2. Chronic hypertension in pregnancy - Not on meds, normotensive - Previously prescribed bASA, will start today  3. Encounter for counseling regarding contraception - Considering POPs with bridge to COCs - Reviewed [email or web address] as well as CDC MEC. Discussed recommendation for POPs to LARCs in setting of well-controlled HTN - Continue discussion with future visits  4. COVID-19 virus infection - No acute symptoms  5. Sinus congestion - Continue saline nasal spray - Advised daily warm air humidifier of hot shower without vent fan to allow for steam - Consider elderberry, Vitamin C, Zinc - Prescribed Claritin  6. Depression, unspecified depression type -  No acute symptoms, declines initiation of Zoloft  Preterm labor symptoms and general obstetric precautions including but not limited to vaginal bleeding, contractions, leaking of fluid and fetal movement were reviewed in detail with the patient. Please refer to After Visit Summary for other counseling recommendations.  Return in about 4 weeks (around 07/25/2020).  Future Appointments  Date Time Provider Department Center  07/06/2020  3:20 PM Just, Azalee Course, FNP PCP-PCP [facility]  07/14/2020  8:00 AM [facility] NURSE [facility]  07/14/2020  8:15 AM [facility] US2 [facility]  07/25/2020  9:35 AM Dan Humphreys, Judye Bos, CNM [facility]    Calvert Cantor, CNM

## 2020-06-28 LAB — POCT URINALYSIS DIP (DEVICE)
Bilirubin Urine: NEGATIVE
Glucose, UA: NEGATIVE mg/dL
Hgb urine dipstick: NEGATIVE
Ketones, ur: NEGATIVE mg/dL
Leukocytes,Ua: NEGATIVE
Nitrite: NEGATIVE
Protein, ur: NEGATIVE mg/dL
Specific Gravity, Urine: >=1.03 (ref 1.005–1.030)
Urobilinogen, UA: 0.2 mg/dL (ref 0.0–1.0)
pH: 5.5 (ref 5.0–8.0)

## 2020-07-06 ENCOUNTER — Encounter: Payer: Self-pay | Admitting: Family Medicine

## 2020-07-06 ENCOUNTER — Ambulatory Visit (INDEPENDENT_AMBULATORY_CARE_PROVIDER_SITE_OTHER): Payer: Medicaid Other | Admitting: Family Medicine

## 2020-07-06 ENCOUNTER — Other Ambulatory Visit: Payer: Self-pay

## 2020-07-06 VITALS — BP 125/79 | HR 83 | Temp 98.2°F | Ht 64.0 in | Wt 288.0 lb

## 2020-07-06 DIAGNOSIS — Z3492 Encounter for supervision of normal pregnancy, unspecified, second trimester: Secondary | ICD-10-CM

## 2020-07-06 DIAGNOSIS — F909 Attention-deficit hyperactivity disorder, unspecified type: Secondary | ICD-10-CM | POA: Diagnosis not present

## 2020-07-06 DIAGNOSIS — F32A Depression, unspecified: Secondary | ICD-10-CM | POA: Diagnosis not present

## 2020-07-06 DIAGNOSIS — I10 Essential (primary) hypertension: Secondary | ICD-10-CM | POA: Diagnosis not present

## 2020-07-06 DIAGNOSIS — K219 Gastro-esophageal reflux disease without esophagitis: Secondary | ICD-10-CM

## 2020-07-06 DIAGNOSIS — F419 Anxiety disorder, unspecified: Secondary | ICD-10-CM | POA: Diagnosis not present
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
## 2020-07-06 NOTE — Patient Instructions (Signed)
Health Maintenance, Female Adopting a healthy lifestyle and getting preventive care are important in promoting health and wellness. Ask your health care provider about:  The right schedule for you to have regular tests and exams.  Things you can do on your own to prevent diseases and keep yourself healthy. What should I know about diet, weight, and exercise? Eat a healthy diet  Eat a diet that includes plenty of vegetables, fruits, low-fat dairy products, and lean protein.  Do not eat a lot of foods that are high in solid fats, added sugars, or sodium.   Maintain a healthy weight Body mass index (BMI) is used to identify weight problems. It estimates body fat based on height and weight. Your health care provider can help determine your BMI and help you achieve or maintain a healthy weight. Get regular exercise Get regular exercise. This is one of the most important things you can do for your health. Most adults should:  Exercise for at least 150 minutes each week. The exercise should increase your heart rate and make you sweat (moderate-intensity exercise).  Do strengthening exercises at least twice a week. This is in addition to the moderate-intensity exercise.  Spend less time sitting. Even light physical activity can be beneficial. Watch cholesterol and blood lipids Have your blood tested for lipids and cholesterol at 29 years of age, then have this test every 5 years. Have your cholesterol levels checked more often if:  Your lipid or cholesterol levels are high.  You are older than 29 years of age.  You are at high risk for heart disease. What should I know about cancer screening? Depending on your health history and family history, you may need to have cancer screening at various ages. This may include screening for:  Breast cancer.  Cervical cancer.  Colorectal cancer.  Skin cancer.  Lung cancer. What should I know about heart disease, diabetes, and high blood  pressure? Blood pressure and heart disease  High blood pressure causes heart disease and increases the risk of stroke. This is more likely to develop in people who have high blood pressure readings, are of African descent, or are overweight.  Have your blood pressure checked: ? Every 3-5 years if you are 18-39 years of age. ? Every year if you are 40 years old or older. Diabetes Have regular diabetes screenings. This checks your fasting blood sugar level. Have the screening done:  Once every three years after age 40 if you are at a normal weight and have a low risk for diabetes.  More often and at a younger age if you are overweight or have a high risk for diabetes. What should I know about preventing infection? Hepatitis B If you have a higher risk for hepatitis B, you should be screened for this virus. Talk with your health care provider to find out if you are at risk for hepatitis B infection. Hepatitis C Testing is recommended for:  Everyone born from 1945 through 1965.  Anyone with known risk factors for hepatitis C. Sexually transmitted infections (STIs)  Get screened for STIs, including gonorrhea and chlamydia, if: ? You are sexually active and are younger than 29 years of age. ? You are older than 29 years of age and your health care provider tells you that you are at risk for this type of infection. ? Your sexual activity has changed since you were last screened, and you are at increased risk for chlamydia or gonorrhea. Ask your health care provider   if you are at risk.  Ask your health care provider about whether you are at high risk for HIV. Your health care provider may recommend a prescription medicine to help prevent HIV infection. If you choose to take medicine to prevent HIV, you should first get tested for HIV. You should then be tested every 3 months for as long as you are taking the medicine. Pregnancy  If you are about to stop having your period (premenopausal) and  you may become pregnant, seek counseling before you get pregnant.  Take 400 to 800 micrograms (mcg) of folic acid every day if you become pregnant.  Ask for birth control (contraception) if you want to prevent pregnancy. Osteoporosis and menopause Osteoporosis is a disease in which the bones lose minerals and strength with aging. This can result in bone fractures. If you are 65 years old or older, or if you are at risk for osteoporosis and fractures, ask your health care provider if you should:  Be screened for bone loss.  Take a calcium or vitamin D supplement to lower your risk of fractures.  Be given hormone replacement therapy (HRT) to treat symptoms of menopause. Follow these instructions at home: Lifestyle  Do not use any products that contain nicotine or tobacco, such as cigarettes, e-cigarettes, and chewing tobacco. If you need help quitting, ask your health care provider.  Do not use street drugs.  Do not share needles.  Ask your health care provider for help if you need support or information about quitting drugs. Alcohol use  Do not drink alcohol if: ? Your health care provider tells you not to drink. ? You are pregnant, may be pregnant, or are planning to become pregnant.  If you drink alcohol: ? Limit how much you use to 0-1 drink a day. ? Limit intake if you are breastfeeding.  Be aware of how much alcohol is in your drink. In the [place] one drink equals one 12 oz bottle of beer (355 mL), one 5 oz glass of wine (148 mL), or one 1 oz glass of hard liquor (44 mL). General instructions  Schedule regular health, dental, and eye exams.  Stay current with your vaccines.  Tell your health care provider if: ? You often feel depressed. ? You have ever been abused or do not feel safe at home. Summary  Adopting a healthy lifestyle and getting preventive care are important in promoting health and wellness.  Follow your health care provider's instructions about healthy  diet, exercising, and getting tested or screened for diseases.  Follow your health care provider's instructions on monitoring your cholesterol and blood pressure. This information is not intended to replace advice given to you by your health care provider. Make sure you discuss any questions you have with your health care provider. Document Revised: 05/27/2018 Document Reviewed: 05/27/2018 Elsevier Patient Education  2021 [employer]  

## 2020-07-14 ENCOUNTER — Other Ambulatory Visit: Payer: Self-pay | Admitting: *Deleted

## 2020-07-14 ENCOUNTER — Ambulatory Visit: Payer: Medicaid Other | Admitting: *Deleted

## 2020-07-14 ENCOUNTER — Ambulatory Visit: Payer: Medicaid Other | Attending: Medical

## 2020-07-14 ENCOUNTER — Other Ambulatory Visit: Payer: Self-pay

## 2020-07-14 ENCOUNTER — Encounter: Payer: Self-pay | Admitting: *Deleted

## 2020-07-14 DIAGNOSIS — Z3490 Encounter for supervision of normal pregnancy, unspecified, unspecified trimester: Secondary | ICD-10-CM

## 2020-07-14 DIAGNOSIS — O99212 Obesity complicating pregnancy, second trimester: Secondary | ICD-10-CM | POA: Diagnosis not present

## 2020-07-14 DIAGNOSIS — Z363 Encounter for antenatal screening for malformations: Secondary | ICD-10-CM

## 2020-07-14 DIAGNOSIS — O2692 Pregnancy related conditions, unspecified, second trimester: Secondary | ICD-10-CM

## 2020-07-14 DIAGNOSIS — F3289 Other specified depressive episodes: Secondary | ICD-10-CM | POA: Diagnosis present

## 2020-07-14 DIAGNOSIS — O99342 Other mental disorders complicating pregnancy, second trimester: Secondary | ICD-10-CM | POA: Diagnosis not present

## 2020-07-14 DIAGNOSIS — Z3A18 18 weeks gestation of pregnancy: Secondary | ICD-10-CM

## 2020-07-14 DIAGNOSIS — O99210 Obesity complicating pregnancy, unspecified trimester: Secondary | ICD-10-CM | POA: Diagnosis present

## 2020-07-14 DIAGNOSIS — F419 Anxiety disorder, unspecified: Secondary | ICD-10-CM

## 2020-07-14 DIAGNOSIS — O10912 Unspecified pre-existing hypertension complicating pregnancy, second trimester: Secondary | ICD-10-CM

## 2020-07-14 DIAGNOSIS — O10919 Unspecified pre-existing hypertension complicating pregnancy, unspecified trimester: Secondary | ICD-10-CM | POA: Insufficient documentation

## 2020-07-14 DIAGNOSIS — E669 Obesity, unspecified: Secondary | ICD-10-CM

## 2020-07-14 DIAGNOSIS — Z6841 Body Mass Index (BMI) 40.0 and over, adult: Secondary | ICD-10-CM

## 2020-07-14 DIAGNOSIS — Z8616 Personal history of COVID-19: Secondary | ICD-10-CM

## 2020-07-14 DIAGNOSIS — Z87798 Personal history of other (corrected) congenital malformations: Secondary | ICD-10-CM

## 2020-07-14 NOTE — Progress Notes (Signed)
C/o "spots x 1 week"

## 2020-07-25 ENCOUNTER — Telehealth (INDEPENDENT_AMBULATORY_CARE_PROVIDER_SITE_OTHER): Payer: Medicaid Other | Admitting: Certified Nurse Midwife

## 2020-07-25 DIAGNOSIS — F909 Attention-deficit hyperactivity disorder, unspecified type: Secondary | ICD-10-CM

## 2020-07-25 DIAGNOSIS — O99342 Other mental disorders complicating pregnancy, second trimester: Secondary | ICD-10-CM

## 2020-07-25 DIAGNOSIS — O10919 Unspecified pre-existing hypertension complicating pregnancy, unspecified trimester: Secondary | ICD-10-CM

## 2020-07-25 DIAGNOSIS — O10912 Unspecified pre-existing hypertension complicating pregnancy, second trimester: Secondary | ICD-10-CM

## 2020-07-25 DIAGNOSIS — Z3A20 20 weeks gestation of pregnancy: Secondary | ICD-10-CM

## 2020-07-25 DIAGNOSIS — Z3490 Encounter for supervision of normal pregnancy, unspecified, unspecified trimester: Secondary | ICD-10-CM

## 2020-07-25 NOTE — Patient Instructions (Signed)
Eating Plan for Pregnant Women While you are pregnant, your body requires additional nutrition to help support your growing baby. You also have a higher need for some vitamins and minerals, such as folic acid, calcium, iron, and vitamin D. Eating a healthy, well-balanced diet is very important for your health and your baby's health. Your need for extra calories varies over the course of your pregnancy. Pregnancy is divided into three trimesters, with each trimester lasting 3 months. For most women, it is recommended to consume:  150 extra calories a day during the first trimester.  300 extra calories a day during the second trimester.  300 extra calories a day during the third trimester. What are tips for following this plan? Cooking  Practice good food safety and cleanliness. Wash your hands before you eat and after you prepare raw meat. Wash all fruits and vegetables well before peeling or eating. Taking these actions can help to prevent foodborne illnesses that can be very dangerous to your baby, such as listeriosis. Ask your health care provider for more information about listeriosis.  Make sure that all meats, poultry, and eggs are cooked to food-safe temperatures or "well-done." Meal planning  Eat a variety of foods (especially fruits and vegetables) to get a full range of vitamins and minerals.  Two or more servings of fish are recommended each week in order to get the most benefits from omega-3 fatty acids that are found in seafood. Choose fish that are lower in mercury, such as salmon and pollock.  Limit your overall intake of foods that have "empty calories." These are foods that have little nutritional value, such as sweets, desserts, candies, and sugar-sweetened beverages.  Drinks that contain caffeine are okay to drink, but it is better to avoid caffeine. Keep your total caffeine intake to less than 200 mg each day (which is 12 oz or 355 mL of coffee, tea, or soda) or the limit as  told by your health care provider.   General information  Do not try to lose weight or go on a diet during pregnancy.  Take a prenatal vitamin to help meet your additional vitamin and mineral needs during pregnancy, specifically for folic acid, iron, calcium, and vitamin D.  Remember to stay active. Ask your health care provider what types of exercise and activities are safe for you. What does 150 extra calories look like? Healthy options that provide 150 extra calories each day could be any of the following:  6-8 oz (170-227 g) plain low-fat yogurt with  cup (70 g) berries.  1 apple with 2 tsp (11 g) peanut butter.  Cut-up vegetables with  cup (60 g) hummus.  8 fl oz (237 mL) low-fat chocolate milk.  1 stick of string cheese with 1 medium orange.  1 peanut butter and jelly sandwich that is made with one slice of whole-wheat bread and 1 tsp (5 g) of peanut butter. For 300 extra calories, you could eat two of these healthy options each day. What is a healthy amount of weight to gain? The right amount of weight gain for you is based on your BMI (body mass index) before you became pregnant.  If your BMI was less than 18 (underweight), you should gain 28-40 lb (13-18 kg).  If your BMI was 18-24.9 (normal), you should gain 25-35 lb (11-16 kg).  If your BMI was 25-29.9 (overweight), you should gain 15-25 lb (7-11 kg).  If your BMI was 30 or greater (obese), you should gain 11-20 lb (  5-9 kg). What if I am having twins or multiples? Generally, if you are carrying twins or multiples:  You may need to eat 300-600 extra calories a day.  The recommended range for total weight gain is 25-54 lb (11-25 kg), depending on your BMI before pregnancy.  Talk with your health care provider to find out about nutritional needs, weight gain, and exercise that is right for you. What foods should I eat? Fruits All fruits. Eat a variety of colors and types of fruit. Remember to wash your fruits well  before peeling or eating. Vegetables All vegetables. Eat a variety of colors and types of vegetables. Remember to wash your vegetables well before peeling or eating. Grains All grains. Choose whole grains, such as whole-wheat bread, oatmeal, or brown rice. Meats and other protein foods Lean meats, including chicken, [place], and lean cuts of beef, veal, or pork. Fish that is higher in omega-3 fatty acids and lower in mercury, such as salmon, herring, mussels, trout, sardines, pollock, shrimp, crab, and lobster. Tofu. Tempeh. Beans. Eggs. Peanut butter and other nut butters. Dairy Pasteurized milk and milk alternatives, such as almond milk. Pasteurized yogurt and pasteurized cheese. Cottage cheese. Sour cream. Beverages Water. Juices that contain 100% fruit juice or vegetable juice. Caffeine-free teas and decaffeinated coffee. Fats and oils Fats and oils are okay to include in moderation. Sweets and desserts Sweets and desserts are okay to include in moderation. Seasoning and other foods All pasteurized condiments. The items listed above may not be a complete list of foods and beverages you can eat. Contact a dietitian for more information.   What foods should I avoid? Fruits Raw (unpasteurized) fruit juices. Vegetables Unpasteurized vegetable juices. Meats and other protein foods Precooked or cured meat, such as bologna, hot dogs, sausages, or meat loaves. (If you must eat those meats, reheat them until they are steaming hot.) Refrigerated pate, meat spreads from a meat counter, or smoked seafood that is found in the refrigerated section of a store. Raw or undercooked meats, poultry, and eggs. Raw fish, such as sushi or sashimi. Fish that have high mercury content, such as tilefish, shark, swordfish, and king mackerel. Dairy Unpasteurized milk and any foods that have unpasteurized milk in them. Soft cheeses, such as feta, queso blanco, queso fresco, [place], [place], panela, and blue-veined  cheeses (unless they are made with pasteurized milk, which must be stated on the label). Beverages Alcohol. Sugar-sweetened beverages, such as sodas, teas, or energy drinks. Seasoning and other foods Homemade fermented foods and drinks, such as pickles, sauerkraut, or kombucha drinks. (Store-bought pasteurized versions of these are okay.) Salads that are made in a store or deli, such as ham salad, chicken salad, egg salad, tuna salad, and seafood salad. The items listed above may not be a complete list of foods and beverages you should avoid. Contact a dietitian for more information. Where to find more information To calculate the number of calories you need based on your height, weight, and activity level, you can use an online calculator such as:  https://www.hunter.com/ To calculate how much weight you should gain during pregnancy, you can use an online pregnancy weight gain calculator such as:  MassVoice.es To learn more about eating fish during pregnancy, talk with your health care provider or visit:  [employer] Summary  While you are pregnant, your body requires additional nutrition to help support your growing baby.  Eat a variety of foods, especially fruits and vegetables, to get a full range of vitamins and minerals.  Practice good food safety and cleanliness. Wash your hands before you eat and after you prepare raw meat. Wash all fruits and vegetables well before peeling or eating. Taking these actions can help to prevent foodborne illnesses, such as listeriosis, that can be very dangerous to your baby.  Do not eat raw meat or fish. Do not eat fish that have high mercury content, such as tilefish, shark, swordfish, and king mackerel. Do not eat raw (unpasteurized) dairy.  Take a prenatal vitamin to help meet your additional vitamin and mineral needs during pregnancy, specifically for folic acid, iron, calcium, and vitamin D. This information is not intended to replace  advice given to you by your health care provider. Make sure you discuss any questions you have with your health care provider. Document Revised: 12/30/2019 Document Reviewed: 12/30/2019 Elsevier Patient Education  2021 Elsevier Inc.   Second Trimester of Pregnancy  The second trimester of pregnancy is from week 13 through week 27. This is also called months 4 through 6 of pregnancy. This is often the time when you feel your best. During the second trimester:  Morning sickness is less or has stopped.  You may have more energy.  You may feel hungry more often. At this time, your unborn baby (fetus) is growing very fast. At the end of the sixth month, the unborn baby may be up to 12 inches long and weigh about 1 pounds. You will likely start to feel the baby move between 16 and 20 weeks of pregnancy. Body changes during your second trimester Your body continues to go through many changes during this time. The changes vary and generally return to normal after the baby is born. Physical changes  You will gain more weight.  You may start to get stretch marks on your hips, belly (abdomen), and breasts.  Your breasts will grow and may hurt.  Dark spots or blotches may develop on your face.  A dark line from your belly button to the pubic area (linea nigra) may appear.  You may have changes in your hair. Health changes  You may have headaches.  You may have heartburn.  You may have trouble pooping (constipation).  You may have hemorrhoids or swollen, bulging veins (varicose veins).  Your gums may bleed.  You may pee (urinate) more often.  You may have back pain. Follow these instructions at home: Medicines  Take over-the-counter and prescription medicines only as told by your doctor. Some medicines are not safe during pregnancy.  Take a prenatal vitamin that contains at least 600 micrograms (mcg) of folic acid. Eating and drinking  Eat healthy meals that  include: ? Fresh fruits and vegetables. ? Whole grains. ? Good sources of protein, such as meat, eggs, or tofu. ? Low-fat dairy products.  Avoid raw meat and unpasteurized juice, milk, and cheese.  You may need to take these actions to prevent or treat trouble pooping: ? Drink enough fluids to keep your pee (urine) pale yellow. ? Eat foods that are high in fiber. These include beans, whole grains, and fresh fruits and vegetables. ? Limit foods that are high in fat and sugar. These include fried or sweet foods. Activity  Exercise only as told by your doctor. Most people can do their usual exercise during pregnancy. Try to exercise for 30 minutes at least 5 days a week.  Stop exercising if you have pain or cramps in your belly or lower back.  Do not exercise if it is too hot or  too humid, or if you are in a place of great height (high altitude).  Avoid heavy lifting.  If you choose to, you may have sex unless your doctor tells you not to. Relieving pain and discomfort  Wear a good support bra if your breasts are sore.  Take warm water baths (sitz baths) to soothe pain or discomfort caused by hemorrhoids. Use hemorrhoid cream if your doctor approves.  Rest with your legs raised (elevated) if you have leg cramps or low back pain.  If you develop bulging veins in your legs: ? Wear support hose as told by your doctor. ? Raise your feet for 15 minutes, 3-4 times a day. ? Limit salt in your food. Safety  Wear your seat belt at all times when you are in a car.  Talk with your doctor if someone is hurting you or yelling at you a lot. Lifestyle  Do not use hot tubs, steam rooms, or saunas.  Do not douche. Do not use tampons or scented sanitary pads.  Avoid cat litter boxes and soil used by cats. These carry germs that can harm your baby and can cause a loss of your baby by miscarriage or stillbirth.  Do not use herbal medicines, illegal drugs, or medicines that are not approved  by your doctor. Do not drink alcohol.  Do not smoke or use any products that contain nicotine or tobacco. If you need help quitting, ask your doctor. General instructions  Keep all follow-up visits. This is important.  Ask your doctor about local prenatal classes.  Ask your doctor about the right foods to eat or for help finding a counselor. Where to find more information  American Pregnancy Association: americanpregnancy.org  [employer]: www.acog.org  Office on [employer] [facility]: MightyReward.co.nz Contact a doctor if:  You have a headache that does not go away when you take medicine.  You have changes in how you see, or you see spots in front of your eyes.  You have mild cramps, pressure, or pain in your lower belly.  You continue to feel like you may vomit (nauseous), you vomit, or you have watery poop (diarrhea).  You have bad-smelling fluid coming from your vagina.  You have pain when you pee or your pee smells bad.  You have very bad swelling of your face, hands, ankles, feet, or legs.  You have a fever. Get help right away if:  You are leaking fluid from your vagina.  You have spotting or bleeding from your vagina.  You have very bad belly cramping or pain.  You have trouble breathing.  You have chest pain.  You faint.  You have not felt your baby move for the time period told by your doctor.  You have new or increased pain, swelling, or redness in an arm or leg. Summary  The second trimester of pregnancy is from week 13 through week 27 (months 4 through 6).  Eat healthy meals.  Exercise as told by your doctor. Most people can do their usual exercise during pregnancy.  Do not use herbal medicines, illegal drugs, or medicines that are not approved by your doctor. Do not drink alcohol.  Call your doctor if you get sick or if you notice anything unusual about your pregnancy. This information is not  intended to replace advice given to you by your health care provider. Make sure you discuss any questions you have with your health care provider. Document Revised: 11/10/2019 Document Reviewed: 09/16/2019 Elsevier  Elsevier Patient Education  2021 Elsevier Inc.   

## 2020-07-25 NOTE — Progress Notes (Signed)
OBSTETRICS PRENATAL VIRTUAL VISIT ENCOUNTER NOTE  Provider location: Center for [facility]   I connected with Ashley Murrain on 07/25/20 at  9:35 AM EST by MyChart Video Encounter at home and verified that I am speaking with the correct person using two identifiers.   I discussed the limitations, risks, security and privacy concerns of performing an evaluation and management service virtually and the availability of in person appointments. I also discussed with the patient that there may be a patient responsible charge related to this service. The patient expressed understanding and agreed to proceed. Subjective:  Alyssa Ray is a 30 y.o. G2P0010 at [redacted]w[redacted]d being seen today for ongoing prenatal care.  She is currently monitored for the following issues for this low-risk pregnancy and has ADHD (attention deficit hyperactivity disorder); Obesity in pregnancy; Supervision of low-risk pregnancy; Depression; Anxiety; and Chronic hypertension in pregnancy on their problem list.  Patient reports no complaints.  Contractions: Not present. Vag. Bleeding: None.  Movement: Present. Denies any leaking of fluid.   The following portions of the patient's history were reviewed and updated as appropriate: allergies, current medications, past family history, past medical history, past social history, past surgical history and problem list.   Objective:  Pt not at home, will file BP in [employer] later. Last night BP was 137/75.  Fetal Status:     Movement: Present     General:  Alert, oriented and cooperative. Patient is in no acute distress.  Respiratory: Normal respiratory effort, no problems with respiration noted  Mental Status: Normal mood and affect. Normal behavior. Normal judgment and thought content.  Rest of physical exam deferred due to type of encounter  Imaging: [place] MFM OB DETAIL +14 WK  Result Date:  07/14/2020 ----------------------------------------------------------------------  OBSTETRICS REPORT                       (Signed Final 07/14/2020 11:24 am) ---------------------------------------------------------------------- Patient Info  ID #:       [id number]                          D.O.B.:  1991/01/04 (29 yrs)  Name:       Tommy Rainwater Metzger                   Visit Date: 07/14/2020 09:14 am ---------------------------------------------------------------------- Performed By  Attending:        Noralee Space MD        Ref. Address:     [address]                                                             [place], [place]                                                             [address]  Performed By:     Tommie Raymond BS,       [facility]  Referred By:      [facility] ---------------------------------------------------------------------- Orders  #  Description                           Code        Ordered By  1  [place] MFM OB DETAIL +14 WK               [id number]    Vonzella Nipple ----------------------------------------------------------------------  #  Order #                     Accession #                Episode #  1  [id number]                   [id number]                 [id number] ---------------------------------------------------------------------- Indications  [redacted] weeks gestation of pregnancy                Z3A.18  Encounter for antenatal screening for          Z36.3  malformations  Hypertension - Chronic/Pre-existing            O10.019  Medical complication of pregnancy (Covid 19    O26.90  + January 2022)  LR NIPS  Obesity complicating pregnancy, second         O99.212  trimester (BMI 49)  Personal history of congenital abnormality     Z87.798   (Cleft Lip)  Other mental disorder complicating             O99.340  pregnancy, second trimester ---------------------------------------------------------------------- Fetal Evaluation  Num Of Fetuses:         1  Fetal Heart Rate(bpm):  153  Cardiac Activity:       Observed  Presentation:           Cephalic  Placenta:               Anterior  P. Cord Insertion:      Visualized, central  Amniotic Fluid  AFI FV:      Within normal limits                              Largest Pocket(cm)  6 ---------------------------------------------------------------------- Biometry  BPD:        44  mm     G. Age:  19w 2d         70  %    CI:        71.28   %    70 - 86                                                          FL/HC:      15.6   %    16.1 - 18.3  HC:       166   mm     G. Age:  19w 2d         64  %    HC/AC:      1.22        1.09 - 1.39  AC:       136   mm     G. Age:  19w 0d         53  %    FL/BPD:     58.9   %  FL:       25.9  mm     G. Age:  17w 6d         13  %    FL/AC:      19.0   %    20 - 24  HUM:      28.6  mm     G. Age:  19w 2d         62  %  CER:      20.4  mm     G. Age:  19w 4d         73  %  NFT:       4.8  mm  LV:        9.1  mm  CM:        3.9  mm  Est. FW:     248  gm      0 lb 9 oz     31  % ---------------------------------------------------------------------- Gestational Age  LMP:           18w 6d        Date:  03/04/20                 EDD:   12/09/20  U/S Today:     18w 6d                                        EDD:   12/09/20  Best:          18w 6d     Det. By:  LMP  (03/04/20)          EDD:   12/09/20 ---------------------------------------------------------------------- Anatomy  Cranium:               Appears normal         Aortic Arch:            Not well visualized  Cavum:                 Appears normal  Ductal Arch:            Not well visualized  Ventricles:            Appears normal         Diaphragm:              Appears normal  Choroid Plexus:         Appears normal         Stomach:                Appears normal, left                                                                        sided  Cerebellum:            Appears normal         Abdomen:                Appears normal  Posterior Fossa:       Appears normal         Abdominal Wall:         Appears nml (cord                                                                        insert, abd wall)  Nuchal Fold:           Appears normal         Cord Vessels:           Appears normal (3                                                                        vessel cord)  Face:                  Orbits nl; profile not Kidneys:                Appear normal                         well visualized  Lips:                  Appears normal         Bladder:                Appears normal  Thoracic:              Appears normal         Spine:                  Limited views  appear normal  Heart:                 EIF in LV: 4CH not     Upper Extremities:      Visualized                         well visualized  RVOT:                  Not well visualized    Lower Extremities:      Visualized  LVOT:                  Not well visualized  Other:  Fetus appears to be a female. Nasal bone visualized. Open hands          visualized. Heels appear normal. Technically difficult due to maternal          habitus and fetal position. ---------------------------------------------------------------------- Cervix Uterus Adnexa  Cervix  Length:           4.37  cm.  Normal appearance by transabdominal scan.  Uterus  No abnormality visualized.  Right Ovary  Within normal limits.  Left Ovary  Within normal limits.  Cul De Sac  No free fluid seen.  Adnexa  No abnormality visualized. ---------------------------------------------------------------------- Impression  G2 P0.  Patient is here for fetal anatomy scan.  Her  pregnancy is well dated by LMP date that is consistent with   11-week ultrasound.  On cell-free fetal DNA screening, the risks of fetal  aneuploidies are not increased .  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  She did not have cleft palate.  She has chronic hypertension that is well-controlled without  antihypertensives.  BP at our office: 138/76 mm Hg .  We performed fetal anatomy scan. An echogenic intracardiac  focus is seen. No other makers of aneuploidies or fetal  structural defects are seen. Fetal biometry is consistent with  her previously-established dates. Amniotic fluid is normal and  good fetal activity is seen.  I informed the patient that given that she had low rik for fetal  aneuploidies on cell-free fetal DNA screening, finding of  echogenic intracardiac focus should be considered a normal  variant and that the risk of trisomy 21 is not increased. I also  reassured that echogenic focus does not increase the risk of  cardiac defects. I also informed her that only amniocentesis  will give a defintive result on the fetal karyotype.  Patient opted not to have amniocentesis.  Because of her personal history of cleft lip, I recommended  genetic counseling. Patient opted not to have genetic  counseling. ---------------------------------------------------------------------- Recommendations  -An appointment was made for her to return in 4 weeks for  completion of fetal anatomy. ----------------------------------------------------------------------                  Noralee Spaceavi Shankar, MD Electronically Signed Final Report   07/14/2020 11:24 am ----------------------------------------------------------------------   Assessment and Plan:  Pregnancy: G2P0010 at 4374w3d 1. Encounter for supervision of low-risk pregnancy, antepartum - Doing much better, nausea resolved  2. [redacted] weeks gestation of pregnancy - Routine OB care - Discussed future cadence of visits  3. Chronic hypertension in pregnancy - BP remains 130s/70s-80s, is diligent about taking  them because her mother got severe preeclampsia with her pregnancies - taking ASA daily - encouraged her to increase protein intake and continue hydrating well - discussed s/sx of preeclampsia - she  is having some intermittent visual disturbances but no headaches. Has an appt with the eye doctor at the end of March.  4. Adult ADHD - Doing well, does not want to start on any  Medication prior to delivery but is concerned about PPMD. Discussed option of starting Zoloft at discharge from hospital after delivery, pt amenable to that plan.   Preterm labor symptoms and general obstetric precautions including but not limited to vaginal bleeding, contractions, leaking of fluid and fetal movement were reviewed in detail with the patient. I discussed the assessment and treatment plan with the patient. The patient was provided an opportunity to ask questions and all were answered. The patient agreed with the plan and demonstrated an understanding of the instructions. The patient was advised to call back or seek an in-person office evaluation/go to [facility]U at [facility] for any urgent or concerning symptoms. Please refer to After Visit Summary for other counseling recommendations.   I provided 15 minutes of face-to-face time during this encounter.  Return in about 4 weeks (around 08/22/2020) for IN-PERSON, HROB.  Future Appointments  Date Time Provider Department Center  08/11/2020  1:45 PM [facility] NURSE [facility]  08/11/2020  2:00 PM [facility] US1 [facility]  01/03/2021  2:00 PM Just, Azalee Course, FNP PCP-PCP [facility]    Bernerd Limbo, CNM [facility] for [employer], [facility]

## 2020-07-25 NOTE — Progress Notes (Signed)
I connected with  Alyssa Ray on 07/25/20 at 0930 by MyChart and verified that I am speaking with the correct person using two identifiers.   I discussed the limitations, risks, security and privacy concerns of performing an evaluation and management service by telephone and the availability of in person appointments. I also discussed with the patient that there may be a patient responsible charge related to this service. The patient expressed understanding and agreed to proceed.   Pt not at home at this time. Reports taking BP last night 137/75.  Marjo Bicker, RN 07/25/2020  9:29 AM

## 2020-07-26 ENCOUNTER — Inpatient Hospital Stay (HOSPITAL_COMMUNITY): Payer: Medicaid Other

## 2020-07-26 ENCOUNTER — Encounter (HOSPITAL_COMMUNITY): Payer: Self-pay | Admitting: Family Medicine

## 2020-07-26 ENCOUNTER — Inpatient Hospital Stay (HOSPITAL_COMMUNITY)
Admission: AD | Admit: 2020-07-26 | Discharge: 2020-07-26 | Disposition: A | Discharge status: Home / Self Care | Payer: Medicaid Other | Attending: Family Medicine | Admitting: Family Medicine

## 2020-07-26 ENCOUNTER — Other Ambulatory Visit: Payer: Self-pay

## 2020-07-26 DIAGNOSIS — Z885 Allergy status to narcotic agent status: Secondary | ICD-10-CM | POA: Insufficient documentation

## 2020-07-26 DIAGNOSIS — O99352 Diseases of the nervous system complicating pregnancy, second trimester: Secondary | ICD-10-CM | POA: Insufficient documentation

## 2020-07-26 DIAGNOSIS — Z3A20 20 weeks gestation of pregnancy: Secondary | ICD-10-CM | POA: Diagnosis not present

## 2020-07-26 DIAGNOSIS — O10912 Unspecified pre-existing hypertension complicating pregnancy, second trimester: Secondary | ICD-10-CM

## 2020-07-26 DIAGNOSIS — O10012 Pre-existing essential hypertension complicating pregnancy, second trimester: Secondary | ICD-10-CM | POA: Insufficient documentation

## 2020-07-26 DIAGNOSIS — R519 Headache, unspecified: Secondary | ICD-10-CM | POA: Diagnosis present

## 2020-07-26 DIAGNOSIS — G43009 Migraine without aura, not intractable, without status migrainosus: Secondary | ICD-10-CM | POA: Diagnosis not present

## 2020-07-26 DIAGNOSIS — F419 Anxiety disorder, unspecified: Secondary | ICD-10-CM | POA: Diagnosis not present

## 2020-07-26 DIAGNOSIS — Z7982 Long term (current) use of aspirin: Secondary | ICD-10-CM | POA: Insufficient documentation

## 2020-07-26 DIAGNOSIS — O99342 Other mental disorders complicating pregnancy, second trimester: Secondary | ICD-10-CM | POA: Diagnosis not present

## 2020-07-26 DIAGNOSIS — Z87891 Personal history of nicotine dependence: Secondary | ICD-10-CM | POA: Diagnosis not present

## 2020-07-26 DIAGNOSIS — O10919 Unspecified pre-existing hypertension complicating pregnancy, unspecified trimester: Secondary | ICD-10-CM

## 2020-07-26 LAB — URINALYSIS, ROUTINE W REFLEX MICROSCOPIC
Bilirubin Urine: NEGATIVE
Glucose, UA: NEGATIVE mg/dL
Hgb urine dipstick: NEGATIVE
Ketones, ur: NEGATIVE mg/dL
Leukocytes,Ua: NEGATIVE
Nitrite: NEGATIVE
Protein, ur: NEGATIVE mg/dL
Specific Gravity, Urine: 1.02 (ref 1.005–1.030)
pH: 6.5 (ref 5.0–8.0)

## 2020-07-26 MED ORDER — DEXAMETHASONE SODIUM PHOSPHATE 10 MG/ML IJ SOLN
10.0000 mg | Freq: Once | INTRAMUSCULAR | Status: AC
  Administered 2020-07-26: 10 mg via INTRAMUSCULAR
  Filled 2020-07-26: qty 1

## 2020-07-26 MED ORDER — IBUPROFEN 600 MG PO TABS
600.0000 mg | ORAL_TABLET | Freq: Once | ORAL | Status: AC
  Administered 2020-07-26: 600 mg via ORAL
  Filled 2020-07-26: qty 1

## 2020-07-26 MED ORDER — DIPHENHYDRAMINE HCL 50 MG/ML IJ SOLN: 25.0000 mg | Freq: Once | INTRAMUSCULAR | Status: DC

## 2020-07-26 MED ORDER — METOCLOPRAMIDE HCL 5 MG/ML IJ SOLN
10.0000 mg | Freq: Once | INTRAMUSCULAR | Status: AC
  Administered 2020-07-26: 10 mg via INTRAMUSCULAR
  Filled 2020-07-26: qty 2

## 2020-07-26 MED ORDER — DEXAMETHASONE SODIUM PHOSPHATE 10 MG/ML IJ SOLN: 10.0000 mg | Freq: Once | INTRAMUSCULAR | Status: DC

## 2020-07-26 MED ORDER — LACTATED RINGERS IV BOLUS: 1000.0000 mL | Freq: Once | INTRAVENOUS | Status: DC

## 2020-07-26 MED ORDER — METOCLOPRAMIDE HCL 5 MG/ML IJ SOLN: 10.0000 mg | Freq: Once | INTRAMUSCULAR | Status: DC

## 2020-07-26 MED ORDER — METOCLOPRAMIDE HCL 10 MG PO TABS: 10.0000 mg | ORAL_TABLET | Freq: Three times a day (TID) | ORAL | 1 refills | Status: DC | PRN

## 2020-07-26 MED ORDER — DIPHENHYDRAMINE HCL 25 MG PO CAPS
50.0000 mg | ORAL_CAPSULE | Freq: Once | ORAL | Status: AC
  Administered 2020-07-26: 50 mg via ORAL
  Filled 2020-07-26: qty 2

## 2020-07-26 MED ORDER — DIPHENHYDRAMINE HCL 50 MG/ML IJ SOLN: 12.5000 mg | Freq: Once | INTRAMUSCULAR | Status: DC

## 2020-07-26 MED ORDER — BUTALBITAL-APAP-CAFFEINE 50-325-40 MG PO TABS
2.0000 | ORAL_TABLET | Freq: Once | ORAL | Status: AC
  Administered 2020-07-26: 2 via ORAL
  Filled 2020-07-26: qty 2

## 2020-07-26 NOTE — MAU Note (Addendum)
Presents c/o visual disturbances, reports as spots.  Also states experiencing numbness in hands & lips.  States took BP yesterday and BP has been elevated.  Denies epigastric pain, has H/A.  Took Tylenol @ 0800 this morning.  Denies VB or LOF.

## 2020-07-26 NOTE — Discharge Instructions (Signed)
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
## 2020-07-27 ENCOUNTER — Other Ambulatory Visit: Payer: Self-pay | Admitting: Obstetrics and Gynecology

## 2020-07-27 DIAGNOSIS — G43009 Migraine without aura, not intractable, without status migrainosus: Secondary | ICD-10-CM

## 2020-08-01 ENCOUNTER — Ambulatory Visit (INDEPENDENT_AMBULATORY_CARE_PROVIDER_SITE_OTHER): Payer: Medicaid Other | Admitting: *Deleted

## 2020-08-01 ENCOUNTER — Other Ambulatory Visit: Payer: Self-pay

## 2020-08-01 ENCOUNTER — Encounter: Payer: Self-pay | Admitting: *Deleted

## 2020-08-01 VITALS — BP 142/74 | HR 77 | Ht 64.0 in | Wt 294.4 lb

## 2020-08-01 DIAGNOSIS — Z0130 Encounter for examination of blood pressure without abnormal findings: Secondary | ICD-10-CM

## 2020-08-01 NOTE — Progress Notes (Addendum)
Pt presents for BP check - 142/74, re-check with her machine - 144/81. Pt checks her BP @ home weekly as recommended and also if she has sx of H/a or visual disturbances. Today she denies H/A or visual disturbances. BP values discussed with Dr. Crissie Reese who recommends continue with current plan of care and no BP meds @ this time. Pt was informed.  She reports decreased fetal movement x2 days. Bedside US performed due to unable to obtain FHR w/doppler.  FHR - 156 bpm per PW doppler. FM observed during brief scan and pt also viewed on wall screen in room. Pt now feels reassured. Per chart review, pt was recommended to have visit w/Jamie due to history of depression. Appointment will be made @ check out today. She voiced understanding of all information and instructions given.

## 2020-08-01 NOTE — Progress Notes (Signed)
Chart reviewed for nurse visit. Agree with plan of care.   Venora Maples, MD 08/01/20 12:30 PM

## 2020-08-02 NOTE — BH Specialist Note (Signed)
Integrated Behavioral Health Initial In-Person Visit  MRN: [id number] Name: Alyssa Ray  Number of Integrated Behavioral Health Clinician visits:: 1/6 Session Start time: 10:45  Session End time: 11:06 Total time: 21 minutes  Types of Service: Individual psychotherapy  Interpretor:No. Interpretor Name and Language: n/a   Warm Hand Off Completed.       Subjective: Alyssa Ray is a 30 y.o. female accompanied by n/a Patient was referred by Edd Arbour, CNM for [facility] history. Patient reports the following symptoms/concerns: Pt states she is seeing a therapist, attends group therapy weekly, and sees psychiatrist monthly; just restarted back on Zoloft at 25mg  for 4 days, then will up to 50mg ; previously on Buspar prior to pregnancy. Pt has a good support network of friends/family; FOB is very supportive. Pt's only concern today is adjusting to discomforts of pregnancy and increase in depression ; open to learning about additional resources available today.  Duration of problem: Ongoing, increase in current pregnancy; Severity of problem: mild  Objective: Mood: Normal and Affect: Appropriate Risk of harm to self or others: No plan to harm self or others  Life Context: Family and Social: Pt lives with boyfriend; supportive friends and family School/Work: Pt's boyfriend is working : Attending therapy, group therapy, psychiatry for self-care Life Changes: Current pregnancy  Patient and/or Family's Strengths/Protective Factors: Social connections, Concrete supports in place (healthy food, safe environments, etc.), Sense of purpose and Physical Health (exercise, healthy diet, medication compliance, etc.)  Goals Addressed: Patient will: 1. Maintain reduction in symptoms of: anxiety, depression and mood instability 2. Increase knowledge and/or ability of: healthy habits  3. Demonstrate ability to: Increase healthy adjustment to current life circumstances  Progress  towards Goals: Ongoing  Interventions: Interventions utilized: Psychoeducation and/or Health Education and Link to  Standardized Assessments completed: Not Needed  Patient and/or Family Response: Pt agrees with treatment plan  Patient Centered Plan: Patient is on the following Treatment Plan(s):  IBH  Assessment: Patient currently experiencing Bipolar 1 disorder and ADHD, both as previously diagnosed via psychiatry.   Patient may benefit from psychoeducation and brief therapeutic interventions regarding maintaining reduction of symptoms of depression, anxiety, and mood instability .  Plan: 1. Follow up with behavioral health clinician on : Call Rutilio Yellowhair at [phone] as needed 2. Behavioral recommendations:  -Continue taking Zoloft as prescribed by psychiatry; continue taking prenatal vitamin daily -Continue prioritizing healthy eating and sleeping for remainder of pregnancy and early postpartum -Go to [email or web address].com to 1. register for childbirth education class of choice and 2. Browse site for additional information needed to prepare for baby's birth (what to bring to hospital, etc.) -Read through Postpartum Planner to begin preparing for postpartum time (on After Visit Summary) -Consider www.postpartum.net for online new mom support groups and for education about perinatal mood and anxiety disorders 3. Referral(s): Integrated [facility] (In Clinic) and Community Resources:  new mom support/resources  [phone], [employer]  Depression screen [facility] 2/9 08/16/2020 08/01/2020 07/25/2020 07/06/2020 06/27/2020  Decreased Interest 1 1 0 2 1  Down, Depressed, Hopeless 0 0 0 1 0  PHQ - 2 Score 1 1 0 3 1  Altered sleeping 0 1 1 0 2  Tired, decreased energy 0 1 3 1 1   Change in appetite 0 2 0 0 0  Feeling bad or failure about yourself  0 0 0 1 0  Trouble concentrating 0 0 0 0 0  Moving slowly or fidgety/restless 0 0 0 0 0  Suicidal thoughts 0  0 0 0 0   PHQ-9 Score 1 5 4 5 4   Difficult doing work/chores - - - Somewhat difficult -   GAD 7 : Generalized Anxiety Score 08/16/2020 08/01/2020 07/25/2020 06/27/2020  Nervous, Anxious, on Edge 1 0 3 0  Control/stop worrying 1 0 0 0  Worry too much - different things 1 1 3  0  Trouble relaxing 0 0 0 0  Restless 1 0 0 0  Easily annoyed or irritable 1 1 3  0  Afraid - awful might happen 1 0 0 0  Total GAD 7 Score 6 2 9  0

## 2020-08-10 ENCOUNTER — Telehealth: Payer: Self-pay | Admitting: *Deleted

## 2020-08-10 NOTE — Telephone Encounter (Signed)
Message received via e-mail on 2/22 @ 7:10 pm from [employer] stating that pt had elevated BP of 148/113. I called pt today to discuss. She stated that someone had reached out to her via phone that evening. She had re-checked her BP and it was in the normal range. Pt denies sx of H/A or visual disturbances. She continues to check her BP daily and states that sll readings have been normal. She is aware of warning signs of pre-eclampsia.

## 2020-08-11 ENCOUNTER — Ambulatory Visit: Payer: Medicaid Other | Attending: Obstetrics and Gynecology

## 2020-08-11 ENCOUNTER — Encounter: Payer: Self-pay | Admitting: *Deleted

## 2020-08-11 ENCOUNTER — Other Ambulatory Visit: Payer: Self-pay

## 2020-08-11 ENCOUNTER — Other Ambulatory Visit: Payer: Self-pay | Admitting: *Deleted

## 2020-08-11 ENCOUNTER — Ambulatory Visit: Payer: Medicaid Other | Admitting: *Deleted

## 2020-08-11 DIAGNOSIS — O10919 Unspecified pre-existing hypertension complicating pregnancy, unspecified trimester: Secondary | ICD-10-CM

## 2020-08-11 DIAGNOSIS — O10012 Pre-existing essential hypertension complicating pregnancy, second trimester: Secondary | ICD-10-CM | POA: Diagnosis not present

## 2020-08-11 DIAGNOSIS — O99212 Obesity complicating pregnancy, second trimester: Secondary | ICD-10-CM | POA: Insufficient documentation

## 2020-08-11 DIAGNOSIS — Z363 Encounter for antenatal screening for malformations: Secondary | ICD-10-CM | POA: Insufficient documentation

## 2020-08-11 DIAGNOSIS — Z8616 Personal history of COVID-19: Secondary | ICD-10-CM | POA: Diagnosis not present

## 2020-08-11 DIAGNOSIS — Z3A22 22 weeks gestation of pregnancy: Secondary | ICD-10-CM | POA: Diagnosis not present

## 2020-08-11 DIAGNOSIS — O321XX0 Maternal care for breech presentation, not applicable or unspecified: Secondary | ICD-10-CM

## 2020-08-11 DIAGNOSIS — O99210 Obesity complicating pregnancy, unspecified trimester: Secondary | ICD-10-CM

## 2020-08-11 DIAGNOSIS — O2693 Pregnancy related conditions, unspecified, third trimester: Secondary | ICD-10-CM

## 2020-08-11 DIAGNOSIS — O10912 Unspecified pre-existing hypertension complicating pregnancy, second trimester: Secondary | ICD-10-CM | POA: Diagnosis not present

## 2020-08-11 DIAGNOSIS — F419 Anxiety disorder, unspecified: Secondary | ICD-10-CM

## 2020-08-11 DIAGNOSIS — E669 Obesity, unspecified: Secondary | ICD-10-CM

## 2020-08-11 DIAGNOSIS — O99342 Other mental disorders complicating pregnancy, second trimester: Secondary | ICD-10-CM

## 2020-08-11 DIAGNOSIS — Z6841 Body Mass Index (BMI) 40.0 and over, adult: Secondary | ICD-10-CM

## 2020-08-14 ENCOUNTER — Telehealth: Payer: Self-pay | Admitting: Obstetrics & Gynecology

## 2020-08-14 NOTE — Telephone Encounter (Signed)
     Faculty Practice OB/GYN Physician Phone Call Documentation  I received a phone call from [employer] about elevated BP192/174for patient Alyssa Ray around 7:30pm.  I called patient, she reported this was her BP after waking up.  Patient denies any headaches, visual symptoms, RUQ/epigastric pain or other concerning symptoms. Stayed on the phone with patient and she re-took her BP, repeat was 137/98. Patient was reassured. Preeclampsia precautions reviewed with patient.    Jaynie Collins, MD, FACOG Obstetrician & Gynecologist, [facility] for [employer], [facility]

## 2020-08-16 ENCOUNTER — Ambulatory Visit (INDEPENDENT_AMBULATORY_CARE_PROVIDER_SITE_OTHER): Payer: Medicaid Other | Admitting: Clinical

## 2020-08-16 ENCOUNTER — Other Ambulatory Visit: Payer: Self-pay

## 2020-08-16 ENCOUNTER — Telehealth: Payer: Self-pay | Admitting: *Deleted

## 2020-08-16 DIAGNOSIS — F319 Bipolar disorder, unspecified: Secondary | ICD-10-CM | POA: Diagnosis not present

## 2020-08-16 DIAGNOSIS — F909 Attention-deficit hyperactivity disorder, unspecified type: Secondary | ICD-10-CM | POA: Diagnosis not present

## 2020-08-16 NOTE — Patient Instructions (Addendum)
Center for [facility] at [facility] [address] [place], [place] [address] [phone] (main office) [phone] (Luisantonio Adinolfi's office)  Www.[email or web address].net (online support groups; great information) Www.[email or web address].com      BRAINSTORMING  Develop a Plan Goals: . Provide a way to start conversation about your new life with a baby . Assist parents in recognizing and using resources within their reach . Help pave the way before birth for an easier period of transition afterwards.  Make a list of the following information to keep in a central location: . Full name of Mom and Partner: _____________________________________________ . Baby's full name and Date of Birth: ___________________________________________ . Home Address: ___________________________________________________________ ________________________________________________________________________ . Home Phone: ____________________________________________________________ . Parents' cell numbers: _____________________________________________________ ________________________________________________________________________ . Name and contact info for OB: ______________________________________________ . Name and contact info for Pediatrician:________________________________________ . Contact info for Lactation Consultants: ________________________________________  REST and SLEEP *You each need at least 4-5 hours of uninterrupted sleep every day. Write specific names and contact information.* . How are you going to rest in the postpartum period? While partner's home? When partner returns to work? When you both return to work? Marland Kitchen Where will your baby sleep? Marland Kitchen Who is available to help during the day? Evening? Night? . Who could move in for a period to help support you? Marland Kitchen What are some ideas to help you get enough  sleep? __________________________________________________________________________________________________________________________________________________________________________________________________________________________________________ NUTRITIOUS FOOD AND DRINK *Plan for meals before your baby is born so you can have healthy food to eat during the immediate postpartum period.* . Who will look after breakfast? Lunch? Dinner? List names and contact information. Brainstorm quick, healthy ideas for each meal. . What can you do before baby is born to prepare meals for the postpartum period? . How can others help you with meals? Marland Kitchen Which grocery stores provide online shopping and delivery? Marland Kitchen Which restaurants offer take-out or delivery options? ______________________________________________________________________________________________________________________________________________________________________________________________________________________________________________________________________________________________________________________________________________________________________________________________________  CARE FOR MOM *It's important that mom is cared for and pampered in the postpartum period. Remember, the most important ways new mothers need care are: sleep, nutrition, gentle exercise, and time off.* . Who can come take care of mom during this period? Make a list of people with their contact information. . List some activities that make you feel cared for, rested, and energized? Who can make sure you have opportunities to do these things? . Does mom have a space of her very own within your home that's just for her? Make a "[facility]" where she can be comfortable, rest, and renew herself  daily. ______________________________________________________________________________________________________________________________________________________________________________________________________________________________________________________________________________________________________________________________________________________________________________________________________    CARE FOR AND FEEDING BABY *Knowledgeable and encouraging people will offer the best support with regard to feeding your baby.* . Educate yourself and choose the best feeding option for your baby. . Make a list of people who will guide, support, and be a resource for you as your care for and feed your baby. (Friends that have breastfed or are currently breastfeeding, lactation consultants, breastfeeding support groups, etc.) . Consider a postpartum doula. (These websites can give you information: dona.org & https://shea.org/) . Seek out local breastfeeding resources like the breastfeeding support group at [employer] or [employer]. ______________________________________________________________________________________________________________________________________________________________________________________________________________________________________________________________________________________________________________________________________________________________________________________________________  Judson Roch AND ERRANDS . Who can help with a thorough cleaning before baby is born? . Make a list of people who will help with housekeeping and chores, like laundry, light cleaning, dishes, bathrooms, etc. . Who can run some errands for you? Marland Kitchen What can you do to make sure you are stocked with basic supplies before baby  is born? . Who is going to do the  shopping? ______________________________________________________________________________________________________________________________________________________________________________________________________________________________________________________________________________________________________________________________________________________________________________________________________     Family Adjustment *Nurture yourselves.it helps parents be more loving and allows for better bonding with their child.* . What sorts of things do you and partner enjoy doing together? Which activities help you to connect and strengthen your relationship? Make a list of those things. Make a list of people whom you trust to care for your baby so you can have some time together as a couple. . What types of things help partner feel connected to Mom? Make a list. . What needs will partner have in order to bond with baby? . Other children? Who will care for them when you go into labor and while you are in the hospital? . Think about what the needs of your older children might be. Who can help you meet those needs? In what ways are you helping them prepare for bringing baby home? List some specific strategies you have for family adjustment. _______________________________________________________________________________________________________________________________________________________________________________________________________________________________________________________________________________________________________________________________________________  SUPPORT *Someone who can empathize with experiences normalizes your problems and makes them more bearable.* . Make a list of other friends, neighbors, and/or co-workers you know with infants (and small children, if applicable) with whom you can connect. . Make a list of local or online support groups, mom groups, etc. in which you can be  involved. ______________________________________________________________________________________________________________________________________________________________________________________________________________________________________________________________________________________________________________________________________________________________________________________________________  Childcare Plans . Investigate and plan for childcare if mom is returning to work. . Talk about mom's concerns about her transition back to work. . Talk about partner's concerns regarding this transition.  Mental Health *Your mental health is one of the highest priorities for a pregnant or postpartum mom.* . 1 in 5 women experience anxiety and/or depression from the time of conception through the first year after birth. . Postpartum Mood Disorders are the #1 complication of pregnancy and childbirth and the suffering experienced by these mothers is not necessary! These illnesses are temporary and respond well to treatment, which often includes self-care, social support, talk therapy, and medication when needed. . Women experiencing anxiety and depression often say things like: "I'm supposed to be happy.why do I feel so sad?", "Why can't I snap out of it?", "I'm having thoughts that scare me." . There is no need to be embarrassed if you are feeling these symptoms: o Overwhelmed, anxious, angry, sad, guilty, irritable, hopeless, exhausted but can't sleep o You are NOT alone. You are NOT to blame. With help, you WILL be well. . Where can I find help? Medical professionals such as your OB, midwife, gynecologist, family practitioner, primary care provider, pediatrician, or mental health providers; [facility] support groups: Feelings After Birth, Breastfeeding Support Group, Baby and Me Group, and Fit 4 Two exercise classes. . You have permission to ask for help. It will confirm your feelings, validate your  experiences, share/learn coping strategies, and gain support and encouragement as you heal. You are important! BRAINSTORM . Make a list of local resources, including resources for mom and for partner. . Identify support groups. . Identify people to call late at night - include names and contact info. . Talk with partner about perinatal mood and anxiety disorders. . Talk with your OB, midwife, and doula about baby blues and about perinatal mood and anxiety disorders. . Talk with your pediatrician about perinatal mood and anxiety disorders.   Support & Sanity Savers   What do you really need?  . Basics . In preparing for a new baby, many expectant parents spend hours shopping for baby  clothes, decorating the nursery, and deciding which car seat to buy. Yet most don't think much about what the reality of parenting a newborn will be like, and what they need to make it through that. So, here is the advice of experienced parents. We know you'll read this, and think "they're exaggerating, I don't really need that." Just trust [place] on these, OK? Plan for all of this, and if it turns out you don't need it, come back and teach [place] how you did it!  Satira Anis (Once baby's survival needs are met, make sure you attend to your own survival needs!) . Sleep . An average newborn sleeps 16-18 hours per day, over 6-7 sleep periods, rarely more than three hours at a time. It is normal and healthy for a newborn to wake throughout the night... but really hard on parents!! . Naps. Prioritize sleep above any responsibilities like: cleaning house, visiting friends, running errands, etc.  Sleep whenever baby sleeps. If you can't nap, at least have restful times when baby eats. The more rest you get, the more patient you will be, the more emotionally stable, and better at solving problems.  . Food . You may not have realized it would be difficult to eat when you have a newborn. Yet, when we talk to . countless new  parents, they say things like "it may be 2:00 pm when I realize I haven't had breakfast yet." Or "every time we sit down to dinner, baby needs to eat, and my food gets cold, so I don't bother to eat it." . Finger food. Before your baby is born, stock up with one months' worth of food that: 1) you can eat with one hand while holding a baby, 2) doesn't need to be prepped, 3) is good hot or cold, 4) doesn't spoil when left out for a few hours, and 5) you like to eat. Think about: nuts, dried fruit, Clif bars, pretzels, jerky, gogurt, baby carrots, apples, bananas, crackers, cheez-n-crackers, string cheese, hot pockets or frozen burritos to microwave, garden burgers and breakfast pastries to put in the toaster, yogurt drinks, etc. . Restaurant Menus. Make lists of your favorite restaurants & menu items. When family/friends want to help, you can give specific information without much thought. They can either bring you the food or send gift cards for just the right meals. Rosaura Carpenter Meals.  Take some time to make a few meals to put in the freezer ahead of time.  Easy to freeze meals can be anything such as soup, lasagna, chicken pie, or spaghetti sauce. . Set up a Meal Schedule.  Ask friends and family to sign up to bring you meals during the first few weeks of being home. (It can be passed around at baby showers!) You have no idea how helpful this will be until you are in the throes of parenting.  https://hamilton-woodard.com/ is a great website to check out. . Emotional Support . Know who to call when you're stressed out. Parenting a newborn is very challenging work. There are times when it totally overwhelms your normal coping abilities. EVERY NEW PARENT NEEDS TO HAVE A PLAN FOR WHO TO CALL WHEN THEY JUST CAN'T COPE ANY MORE. (And it has to be someone other than the baby's other parent!) Before your baby is born, come up with at least one person you can call for support - write their phone number down and post it on the  refrigerator. Marland Kitchen Anxiety & Sadness. Baby blues are normal  after pregnancy; however, there are more severe types of anxiety & sadness which can occur and should not be ignored.  They are always treatable, but you have to take the first step by reaching out for help. [facility] offers a "Mom Talk" group which meets every Tuesday from 10 am - 11 am.  This group is for new moms who need support and connection after their babies are born.  Call [phone].  Marland Kitchen Really, Really Helpful (Plan for them! Make sure these happen often!!) . Physical Support with Taking Care of Yourselves . Asking friends and family. Before your baby is born, set up a schedule of people who can come and visit and help out (or ask a friend to schedule for you). Any time someone says "let me know what I can do to help," sign them up for a day. When they get there, their job is not to take care of the baby (that's your job and your joy). Their job is to take care of you!  . Postpartum doulas. If you don't have anyone you can call on for support, look into postpartum doulas:  professionals at helping parents with caring for baby, caring for themselves, getting breastfeeding started, and helping with household tasks. www.padanc.org is a helpful website for learning about doulas in our area. . Peer Support / Parent Groups . Why: One of the greatest ideas for new parents is to be around other new parents. Parent groups give you a chance to share and listen to others who are going through the same season of life, get a sense of what is normal infant development by watching several babies learn and grow, share your stories of triumph and struggles with empathetic ears, and forgive your own mistakes when you realize all parents are learning by trial and error. . Where to find: There are many places you can meet other new parents throughout our community.  [facility] offers the following classes for new moms and their little ones:  Baby  and Me (Birth to [place]) and Breastfeeding Support Group. Go to www.conehealthybaby.com or call [phone] for more information. . Time for your Relationship . It's easy to get so caught up in meeting baby's immediate needs that it's hard to find time to connect with your partner, and meet the needs of your relationship. It's also easy to forget what "quality time with your partner" actually looks like. If you take your baby on a date, you'd be amazed how much of your couple time is spent feeding the baby, diapering the baby, admiring the baby, and talking about the baby. . Dating: Try to take time for just the two of you. Babysitter tip: Sometimes when moms are breastfeeding a newborn, they find it hard to figure out how to schedule outings around baby's unpredictable feeding schedules. Have the babysitter come for a three hour period. When she comes over, if baby has just eaten, you can leave right away, and come back in two hours. If baby hasn't fed recently, you start the date at home. Once baby gets hungry and gets a good feeding in, you can head out for the rest of your date time. . Date Nights at Home: If you can't get out, at least set aside one evening a week to prioritize your relationship: whenever baby dozes off or doesn't have any immediate needs, spend a little time focusing on each other. . Potential conflicts: The main relationship conflicts that come up for new parents are: issues  related to sexuality, financial stresses, a feeling of an unfair division of household tasks, and conflicts in parenting styles. The more you can work on these issues before baby arrives, the better!  Clint Guy and Frills (Don't forget these. and don't feel guilty for indulging in them!) . Everyone has something in life that is a fun little treat that they do just for themselves. It may be: reading the morning paper, or going for a daily jog, or having coffee with a friend once a week, or going to a movie on Friday  nights, or fine chocolates, or bubble baths, or curling up with a good book. . Unless you do fun things for yourself every now and then, it's hard to have the energy for fun with your baby. Whatever your "special" treats are, make sure you find a way to continue to indulge in them after your baby is born. These special moments can recharge you, and allow you to return to baby with a new joy   PERINATAL MOOD DISORDERS: [place]   Emergency and Crisis Resources:  If you are an imminent risk to self or others, are experiencing intense personal distress, and/or have noticed significant changes in activities of daily living, call:  . 911 . [facility]: [phone] . Mobile Crisis: [phone] . National Suicide Hotline: (267) 132-4026 Or visit the following crisis centers: . Local Emergency Departments . [facility]: [address], [place]. Hours: 8:30AM-5PM. Insurance Accepted: Medicaid, Medicare, and Uninsured.  Marland Kitchen [facility]  [address], [place] Mon-Friday 8am-3pm  [phone]                                                                                    Non-Crisis Resources: To identify specific providers that are covered by your insurance, contact your insurance company or local agencies: [place]: [phone] [facility] and [place]: [phone] [place]: [phone] Postpartum Support International- [place] [phone]                                                      Outpatient therapy and medication management providers:  [facility] [phone] Hours: 9AM-5PM  Insurance Accepted: Alben Spittle, Lorella Nimrod, Freddrick March, [place], Medicare  [facility]) [phone] Hours: 8AM-5PM  nsurance Accepted: All insurances EXCEPT AARP, [place], [place], and  [place]: [phone]             Hours: 8AM-8PM Insurance Accepted: Cristal Ford, Freddrick March, [place], Medicare, [phone] [facility]: [phone] Hours: 8:30AM-7PM Insurance Accepted: Cristal Ford, Medicaid, Medicare, Tricare, [employer] Counseling:  [place] Accepted:  Holland Falling, Lorella Nimrod, [employer], [employer], [facility] [phone] Hours: 9AM-5:30PM Insurance Accepted: Alben Spittle, [place], [place], and Medicaid, Medicare, [employer] Counseling:  [phone] Hours: 9am-5pm Insurance Accepted: [place]; they do not accept Medicaid/Medicare [place]: [phone] Hours: 9am-9pm Insurance Accepted: All major insurance including Medicaid and Medicare [facility]: [phone] Hours: 9AM-5:30PM Insurance Accepted: All insurances EXCEPT Medicaid and Medicare. [facility]: [place]: [phone] [place]:  [place] (support for children in the NICU and/or with special needs), [place] [employer]: [phone]                                                                                     Online Resources: Postpartum Support International: http://jones-berg.com/  800-944-4PPD 2Moms Supporting Moms:  www.momssupportingmoms.net  /Emotional [employer] and Websites Here are a few free apps meant to help you to help yourself.  To find, try searching on the internet to see if the app is offered on Apple/Android devices. If your first choice doesn't come up on  your device, the good news is that there are many choices! Play around with different apps to see which ones are helpful to you.    Calm This is an app meant to help increase calm feelings. Includes info, strategies, and tools for tracking your feelings.      Calm Harm  This app is meant to help with self-harm. Provides many 5-minute or 15-min coping strategies for doing instead of hurting yourself.       [place] is a problem-solving tool to help deal with emotions and cope with stress you encounter wherever you are.      MindShift This app can help people cope with anxiety. Rather than trying to avoid anxiety, you can make an important shift and face it.      MY3  MY3 features a support system, safety plan and resources with the goal of offering a tool to use in a time of need.       My Life My Voice  This mood journal offers a simple solution for tracking your thoughts, feelings and moods. Animated emoticons can help identify your mood.       Relax Melodies Designed to help with sleep, on this app you can mix sounds and meditations for relaxation.      Smiling Mind Smiling Mind is meditation made easy: it's a simple tool that helps put a  smile on your mind.        Stop, Breathe & Think  A friendly, simple guide for people through meditations for mindfulness and compassion.  Stop, Breathe and Think Kids Enter your current feelings and choose a "mission" to help you cope. Offers videos for certain moods instead of just sound recordings.       Team Orange The goal of this tool is to help teens change how they think, act, and react. This app helps you focus on your own good feelings and experiences.      The Ash[place] Box The [place] Box (VHB) contains simple tools to help patients with coping, relaxation, distraction, and positive thinking.

## 2020-08-16 NOTE — Telephone Encounter (Addendum)
Pt left VM message stating that she has been having some intermittent sharp abdominal pain since yesterday. The pain is tolerable. She also has not felt the baby move for 2 days. I returned call to pt and discussed her concerns. I advised that she may be experiencing round ligament pain. Due to the change in FM, I asked pt to come to office today for FHR check. We will further discuss her pain @ that time as well. Pt voiced understanding and agreed to plan of care.   1030  Pt present in office. Discussion of her pain is consistent with round ligament in that it is intermittent and predominately on Rt and Lt side of lower abdomen. Pt was advised to go to hospital if the pain becomes severe.  FHR - 138 bpm per doppler. She voiced understanding of information and instructions given. Pt was escorted to [facility] for completion of scheduled visit.

## 2020-08-18 ENCOUNTER — Encounter: Payer: Self-pay | Admitting: *Deleted

## 2020-08-22 ENCOUNTER — Encounter: Payer: Self-pay | Admitting: Family Medicine

## 2020-08-22 ENCOUNTER — Other Ambulatory Visit: Payer: Self-pay

## 2020-08-22 ENCOUNTER — Ambulatory Visit (INDEPENDENT_AMBULATORY_CARE_PROVIDER_SITE_OTHER): Payer: Medicaid Other | Admitting: Family Medicine

## 2020-08-22 VITALS — BP 146/79 | HR 68 | Wt 297.7 lb

## 2020-08-22 DIAGNOSIS — F419 Anxiety disorder, unspecified: Secondary | ICD-10-CM

## 2020-08-22 DIAGNOSIS — M654 Radial styloid tenosynovitis [de Quervain]: Secondary | ICD-10-CM

## 2020-08-22 DIAGNOSIS — O10919 Unspecified pre-existing hypertension complicating pregnancy, unspecified trimester: Secondary | ICD-10-CM

## 2020-08-22 DIAGNOSIS — Z3492 Encounter for supervision of normal pregnancy, unspecified, second trimester: Secondary | ICD-10-CM

## 2020-08-22 DIAGNOSIS — O99210 Obesity complicating pregnancy, unspecified trimester: Secondary | ICD-10-CM

## 2020-08-22 HISTORY — DX: Radial styloid tenosynovitis (de quervain): M65.4

## 2020-08-22 LAB — POCT URINALYSIS DIP (DEVICE)
Bilirubin Urine: NEGATIVE
Glucose, UA: NEGATIVE mg/dL
Hgb urine dipstick: NEGATIVE
Ketones, ur: NEGATIVE mg/dL
Leukocytes,Ua: NEGATIVE
Nitrite: NEGATIVE
Protein, ur: NEGATIVE mg/dL
Specific Gravity, Urine: >=1.03 (ref 1.005–1.030)
Urobilinogen, UA: 0.2 mg/dL (ref 0.0–1.0)
pH: 6 (ref 5.0–8.0)

## 2020-08-22 NOTE — Progress Notes (Signed)
   Subjective:  Alyssa Ray is a 30 y.o. G2P0010 at [redacted]w[redacted]d being seen today for ongoing prenatal care.  She is currently monitored for the following issues for this high-risk pregnancy and has ADHD (attention deficit hyperactivity disorder); Obesity in pregnancy; Supervision of low-risk pregnancy; Depression; Anxiety; and Chronic hypertension in pregnancy on their problem list.  Patient reports some pain in R wrist.  Contractions: Not present. Vag. Bleeding: None.  Movement: Present. Denies leaking of fluid.   The following portions of the patient's history were reviewed and updated as appropriate: allergies, current medications, past family history, past medical history, past social history, past surgical history and problem list. Problem list updated.  Objective:   Vitals:   08/22/20 1115  BP: (!) 146/79  Pulse: 68  Weight: 297 lb 11.2 oz (135 kg)    Fetal Status: Fetal Heart Rate (bpm): 142   Movement: Present     General:  Alert, oriented and cooperative. Patient is in no acute distress.  Skin: Skin is warm and dry. No rash noted.   Cardiovascular: Normal heart rate noted  Respiratory: Normal respiratory effort, no problems with respiration noted  Abdomen: Soft, gravid, appropriate for gestational age. Pain/Pressure: Absent     Pelvic: Vag. Bleeding: None     Cervical exam deferred        Extremities: Normal range of motion.  Edema: Mild pitting, slight indentation  Mental Status: Normal mood and affect. Normal behavior. Normal judgment and thought content.   Urinalysis:      Assessment and Plan:  Pregnancy: G2P0010 at [redacted]w[redacted]d  1. Chronic hypertension in pregnancy Mild range BP, asymptomatic Counseled on warning signs of PreE Taking baby ASA Following w MFM regularly for growth scans Not currently on meds but will need to watch closely for initation  2. Obesity in pregnancy   3. Encounter for supervision of low-risk pregnancy in second trimester BP as above FHR  normal Discussed contraception, referred to bedsider  4. Anxiety Seeing BH Improved  5. De Quervain's tenosynovitis, right Mild symptoms, not c/w carpal tunnel. +Finkelstein test. Discussed wearing a brace, if not improved by next visit and no GDM can consider steroid injection  Preterm labor symptoms and general obstetric precautions including but not limited to vaginal bleeding, contractions, leaking of fluid and fetal movement were reviewed in detail with the patient. Please refer to After Visit Summary for other counseling recommendations.  Return in 4 weeks (on 09/19/2020).   Venora Maples, MD

## 2020-08-22 NOTE — Patient Instructions (Signed)
 Contraception Choices Contraception, also called birth control, refers to methods or devices that prevent pregnancy. Hormonal methods Contraceptive implant A contraceptive implant is a thin, plastic tube that contains a hormone that prevents pregnancy. It is different from an intrauterine device (IUD). It is inserted into the upper part of the arm by a health care provider. Implants can be effective for up to 3 years. Progestin-only injections Progestin-only injections are injections of progestin, a synthetic form of the hormone progesterone. They are given every 3 months by a health care provider. Birth control pills Birth control pills are pills that contain hormones that prevent pregnancy. They must be taken once a day, preferably at the same time each day. A prescription is needed to use this method of contraception. Birth control patch The birth control patch contains hormones that prevent pregnancy. It is placed on the skin and must be changed once a week for three weeks and removed on the fourth week. A prescription is needed to use this method of contraception. Vaginal ring A vaginal ring contains hormones that prevent pregnancy. It is placed in the vagina for three weeks and removed on the fourth week. After that, the process is repeated with a new ring. A prescription is needed to use this method of contraception. Emergency contraceptive Emergency contraceptives prevent pregnancy after unprotected sex. They come in pill form and can be taken up to 5 days after sex. They work best the sooner they are taken after having sex. Most emergency contraceptives are available without a prescription. This method should not be used as your only form of birth control.   Barrier methods Female condom A female condom is a thin sheath that is worn over the penis during sex. Condoms keep sperm from going inside a woman's body. They can be used with a sperm-killing substance (spermicide) to increase their  effectiveness. They should be thrown away after one use. Female condom A female condom is a soft, loose-fitting sheath that is put into the vagina before sex. The condom keeps sperm from going inside a woman's body. They should be thrown away after one use. Diaphragm A diaphragm is a soft, dome-shaped barrier. It is inserted into the vagina before sex, along with a spermicide. The diaphragm blocks sperm from entering the uterus, and the spermicide kills sperm. A diaphragm should be left in the vagina for 6-8 hours after sex and removed within 24 hours. A diaphragm is prescribed and fitted by a health care provider. A diaphragm should be replaced every 1-2 years, after giving birth, after gaining more than 15 lb (6.8 kg), and after pelvic surgery. Cervical cap A cervical cap is a round, soft latex or plastic cup that fits over the cervix. It is inserted into the vagina before sex, along with spermicide. It blocks sperm from entering the uterus. The cap should be left in place for 6-8 hours after sex and removed within 48 hours. A cervical cap must be prescribed and fitted by a health care provider. It should be replaced every 2 years. Sponge A sponge is a soft, circular piece of polyurethane foam with spermicide in it. The sponge helps block sperm from entering the uterus, and the spermicide kills sperm. To use it, you make it wet and then insert it into the vagina. It should be inserted before sex, left in for at least 6 hours after sex, and removed and thrown away within 30 hours. Spermicides Spermicides are chemicals that kill or block sperm from entering the   cervix and uterus. They can come as a cream, jelly, suppository, foam, or tablet. A spermicide should be inserted into the vagina with an applicator at least 10-15 minutes before sex to allow time for it to work. The process must be repeated every time you have sex. Spermicides do not require a prescription.   Intrauterine  contraception Intrauterine device (IUD) An IUD is a T-shaped device that is put in a woman's uterus. There are two types:  Hormone IUD.This type contains progestin, a synthetic form of the hormone progesterone. This type can stay in place for 3-5 years.  Copper IUD.This type is wrapped in copper wire. It can stay in place for 10 years. Permanent methods of contraception Female tubal ligation In this method, a woman's fallopian tubes are sealed, tied, or blocked during surgery to prevent eggs from traveling to the uterus. Hysteroscopic sterilization In this method, a small, flexible insert is placed into each fallopian tube. The inserts cause scar tissue to form in the fallopian tubes and block them, so sperm cannot reach an egg. The procedure takes about 3 months to be effective. Another form of birth control must be used during those 3 months. Female sterilization This is a procedure to tie off the tubes that carry sperm (vasectomy). After the procedure, the man can still ejaculate fluid (semen). Another form of birth control must be used for 3 months after the procedure. Natural planning methods Natural family planning In this method, a couple does not have sex on days when the woman could become pregnant. Calendar method In this method, the woman keeps track of the length of each menstrual cycle, identifies the days when pregnancy can happen, and does not have sex on those days. Ovulation method In this method, a couple avoids sex during ovulation. Symptothermal method This method involves not having sex during ovulation. The woman typically checks for ovulation by watching changes in her temperature and in the consistency of cervical mucus. Post-ovulation method In this method, a couple waits to have sex until after ovulation. Where to find more information  Centers for Disease Control and Prevention: www.cdc.gov Summary  Contraception, also called birth control, refers to methods or  devices that prevent pregnancy.  Hormonal methods of contraception include implants, injections, pills, patches, vaginal rings, and emergency contraceptives.  Barrier methods of contraception can include female condoms, female condoms, diaphragms, cervical caps, sponges, and spermicides.  There are two types of IUDs (intrauterine devices). An IUD can be put in a woman's uterus to prevent pregnancy for 3-5 years.  Permanent sterilization can be done through a procedure for males and females. Natural family planning methods involve nothaving sex on days when the woman could become pregnant. This information is not intended to replace advice given to you by your health care provider. Make sure you discuss any questions you have with your health care provider. Document Revised: 11/08/2019 Document Reviewed: 11/08/2019 Elsevier Patient Education  2021 [employer] Inc.   Breastfeeding  Choosing to breastfeed is one of the best decisions you can make for yourself and your baby. A change in hormones during pregnancy causes your breasts to make breast milk in your milk-producing glands. Hormones prevent breast milk from being released before your baby is born. They also prompt milk flow after birth. Once breastfeeding has begun, thoughts of your baby, as well as his or her sucking or crying, can stimulate the release of milk from your milk-producing glands. Benefits of breastfeeding Research shows that breastfeeding offers many health benefits   for infants and mothers. It also offers a cost-free and convenient way to feed your baby. For your baby  Your first milk (colostrum) helps your baby's digestive system to function better.  Special cells in your milk (antibodies) help your baby to fight off infections.  Breastfed babies are less likely to develop asthma, allergies, obesity, or type 2 diabetes. They are also at lower risk for sudden infant death syndrome (SIDS).  Nutrients in breast milk are better  able to meet your baby's needs compared to infant formula.  Breast milk improves your baby's brain development. For you  Breastfeeding helps to create a very special bond between you and your baby.  Breastfeeding is convenient. Breast milk costs nothing and is always available at the correct temperature.  Breastfeeding helps to burn calories. It helps you to lose the weight that you gained during pregnancy.  Breastfeeding makes your uterus return faster to its size before pregnancy. It also slows bleeding (lochia) after you give birth.  Breastfeeding helps to lower your risk of developing type 2 diabetes, osteoporosis, rheumatoid arthritis, cardiovascular disease, and breast, ovarian, uterine, and endometrial cancer later in life. Breastfeeding basics Starting breastfeeding  Find a comfortable place to sit or lie down, with your neck and back well-supported.  Place a pillow or a rolled-up blanket under your baby to bring him or her to the level of your breast (if you are seated). Nursing pillows are specially designed to help support your arms and your baby while you breastfeed.  Make sure that your baby's tummy (abdomen) is facing your abdomen.  Gently massage your breast. With your fingertips, massage from the outer edges of your breast inward toward the nipple. This encourages milk flow. If your milk flows slowly, you may need to continue this action during the feeding.  Support your breast with 4 fingers underneath and your thumb above your nipple (make the letter "C" with your hand). Make sure your fingers are well away from your nipple and your baby's mouth.  Stroke your baby's lips gently with your finger or nipple.  When your baby's mouth is open wide enough, quickly bring your baby to your breast, placing your entire nipple and as much of the areola as possible into your baby's mouth. The areola is the colored area around your nipple. ? More areola should be visible above your  baby's upper lip than below the lower lip. ? Your baby's lips should be opened and extended outward (flanged) to ensure an adequate, comfortable latch. ? Your baby's tongue should be between his or her lower gum and your breast.  Make sure that your baby's mouth is correctly positioned around your nipple (latched). Your baby's lips should create a seal on your breast and be turned out (everted).  It is common for your baby to suck about 2-3 minutes in order to start the flow of breast milk. Latching Teaching your baby how to latch onto your breast properly is very important. An improper latch can cause nipple pain, decreased milk supply, and poor weight gain in your baby. Also, if your baby is not latched onto your nipple properly, he or she may swallow some air during feeding. This can make your baby fussy. Burping your baby when you switch breasts during the feeding can help to get rid of the air. However, teaching your baby to latch on properly is still the best way to prevent fussiness from swallowing air while breastfeeding. Signs that your baby has successfully latched onto   your nipple  Silent tugging or silent sucking, without causing you pain. Infant's lips should be extended outward (flanged).  Swallowing heard between every 3-4 sucks once your milk has started to flow (after your let-down milk reflex occurs).  Muscle movement above and in front of his or her ears while sucking. Signs that your baby has not successfully latched onto your nipple  Sucking sounds or smacking sounds from your baby while breastfeeding.  Nipple pain. If you think your baby has not latched on correctly, slip your finger into the corner of your baby's mouth to break the suction and place it between your baby's gums. Attempt to start breastfeeding again. Signs of successful breastfeeding Signs from your baby  Your baby will gradually decrease the number of sucks or will completely stop sucking.  Your baby  will fall asleep.  Your baby's body will relax.  Your baby will retain a small amount of milk in his or her mouth.  Your baby will let go of your breast by himself or herself. Signs from you  Breasts that have increased in firmness, weight, and size 1-3 hours after feeding.  Breasts that are softer immediately after breastfeeding.  Increased milk volume, as well as a change in milk consistency and color by the fifth day of breastfeeding.  Nipples that are not sore, cracked, or bleeding. Signs that your baby is getting enough milk  Wetting at least 1-2 diapers during the first 24 hours after birth.  Wetting at least 5-6 diapers every 24 hours for the first week after birth. The urine should be clear or pale yellow by the age of 5 days.  Wetting 6-8 diapers every 24 hours as your baby continues to grow and develop.  At least 3 stools in a 24-hour period by the age of 5 days. The stool should be soft and yellow.  At least 3 stools in a 24-hour period by the age of 7 days. The stool should be seedy and yellow.  No loss of weight greater than 10% of birth weight during the first 3 days of life.  Average weight gain of 4-7 oz (113-198 g) per week after the age of 4 days.  Consistent daily weight gain by the age of 5 days, without weight loss after the age of 2 weeks. After a feeding, your baby may spit up a small amount of milk. This is normal. Breastfeeding frequency and duration Frequent feeding will help you make more milk and can prevent sore nipples and extremely full breasts (breast engorgement). Breastfeed when you feel the need to reduce the fullness of your breasts or when your baby shows signs of hunger. This is called "breastfeeding on demand." Signs that your baby is hungry include:  Increased alertness, activity, or restlessness.  Movement of the head from side to side.  Opening of the mouth when the corner of the mouth or cheek is stroked (rooting).  Increased  sucking sounds, smacking lips, cooing, sighing, or squeaking.  Hand-to-mouth movements and sucking on fingers or hands.  Fussing or crying. Avoid introducing a pacifier to your baby in the first 4-6 weeks after your baby is born. After this time, you may choose to use a pacifier. Research has shown that pacifier use during the first year of a baby's life decreases the risk of sudden infant death syndrome (SIDS). Allow your baby to feed on each breast as long as he or she wants. When your baby unlatches or falls asleep while feeding from the   first breast, offer the second breast. Because newborns are often sleepy in the first few weeks of life, you may need to awaken your baby to get him or her to feed. Breastfeeding times will vary from baby to baby. However, the following rules can serve as a guide to help you make sure that your baby is properly fed:  Newborns (babies 4 weeks of age or younger) may breastfeed every 1-3 hours.  Newborns should not go without breastfeeding for longer than 3 hours during the day or 5 hours during the night.  You should breastfeed your baby a minimum of 8 times in a 24-hour period. Breast milk pumping Pumping and storing breast milk allows you to make sure that your baby is exclusively fed your breast milk, even at times when you are unable to breastfeed. This is especially important if you go back to work while you are still breastfeeding, or if you are not able to be present during feedings. Your lactation consultant can help you find a method of pumping that works best for you and give you guidelines about how long it is safe to store breast milk.      Caring for your breasts while you breastfeed Nipples can become dry, cracked, and sore while breastfeeding. The following recommendations can help keep your breasts moisturized and healthy:  Avoid using soap on your nipples.  Wear a supportive bra designed especially for nursing. Avoid wearing underwire-style  bras or extremely tight bras (sports bras).  Air-dry your nipples for 3-4 minutes after each feeding.  Use only cotton bra pads to absorb leaked breast milk. Leaking of breast milk between feedings is normal.  Use lanolin on your nipples after breastfeeding. Lanolin helps to maintain your skin's normal moisture barrier. Pure lanolin is not harmful (not toxic) to your baby. You may also hand express a few drops of breast milk and gently massage that milk into your nipples and allow the milk to air-dry. In the first few weeks after giving birth, some women experience breast engorgement. Engorgement can make your breasts feel heavy, warm, and tender to the touch. Engorgement peaks within 3-5 days after you give birth. The following recommendations can help to ease engorgement:  Completely empty your breasts while breastfeeding or pumping. You may want to start by applying warm, moist heat (in the shower or with warm, water-soaked hand towels) just before feeding or pumping. This increases circulation and helps the milk flow. If your baby does not completely empty your breasts while breastfeeding, pump any extra milk after he or she is finished.  Apply ice packs to your breasts immediately after breastfeeding or pumping, unless this is too uncomfortable for you. To do this: ? Put ice in a plastic bag. ? Place a towel between your skin and the bag. ? Leave the ice on for 20 minutes, 2-3 times a day.  Make sure that your baby is latched on and positioned properly while breastfeeding. If engorgement persists after 48 hours of following these recommendations, contact your health care provider or a lactation consultant. Overall health care recommendations while breastfeeding  Eat 3 healthy meals and 3 snacks every day. Well-nourished mothers who are breastfeeding need an additional 450-500 calories a day. You can meet this requirement by increasing the amount of a balanced diet that you eat.  Drink  enough water to keep your urine pale yellow or clear.  Rest often, relax, and continue to take your prenatal vitamins to prevent fatigue, stress, and low   vitamin and mineral levels in your body (nutrient deficiencies).  Do not use any products that contain nicotine or tobacco, such as cigarettes and e-cigarettes. Your baby may be harmed by chemicals from cigarettes that pass into breast milk and exposure to secondhand smoke. If you need help quitting, ask your health care provider.  Avoid alcohol.  Do not use illegal drugs or marijuana.  Talk with your health care provider before taking any medicines. These include over-the-counter and prescription medicines as well as vitamins and herbal supplements. Some medicines that may be harmful to your baby can pass through breast milk.  It is possible to become pregnant while breastfeeding. If birth control is desired, ask your health care provider about options that will be safe while breastfeeding your baby. Where to find more information: [employer]: www.llli.org Contact a health care provider if:  You feel like you want to stop breastfeeding or have become frustrated with breastfeeding.  Your nipples are cracked or bleeding.  Your breasts are red, tender, or warm.  You have: ? Painful breasts or nipples. ? A swollen area on either breast. ? A fever or chills. ? Nausea or vomiting. ? Drainage other than breast milk from your nipples.  Your breasts do not become full before feedings by the fifth day after you give birth.  You feel sad and depressed.  Your baby is: ? Too sleepy to eat well. ? Having trouble sleeping. ? More than 1 week old and wetting fewer than 6 diapers in a 24-hour period. ? Not gaining weight by 5 days of age.  Your baby has fewer than 3 stools in a 24-hour period.  Your baby's skin or the white parts of his or her eyes become yellow. Get help right away if:  Your baby is overly tired  (lethargic) and does not want to wake up and feed.  Your baby develops an unexplained fever. Summary  Breastfeeding offers many health benefits for infant and mothers.  Try to breastfeed your infant when he or she shows early signs of hunger.  Gently tickle or stroke your baby's lips with your finger or nipple to allow the baby to open his or her mouth. Bring the baby to your breast. Make sure that much of the areola is in your baby's mouth. Offer one side and burp the baby before you offer the other side.  Talk with your health care provider or lactation consultant if you have questions or you face problems as you breastfeed. This information is not intended to replace advice given to you by your health care provider. Make sure you discuss any questions you have with your health care provider. Document Revised: 08/28/2017 Document Reviewed: 07/05/2016 Elsevier Patient Education  2021 Elsevier Inc.  

## 2020-09-06 ENCOUNTER — Other Ambulatory Visit: Payer: Self-pay | Admitting: Maternal & Fetal Medicine

## 2020-09-06 ENCOUNTER — Ambulatory Visit: Payer: Medicaid Other | Admitting: *Deleted

## 2020-09-06 ENCOUNTER — Encounter: Payer: Self-pay | Admitting: *Deleted

## 2020-09-06 ENCOUNTER — Other Ambulatory Visit: Payer: Self-pay | Admitting: Obstetrics and Gynecology

## 2020-09-06 ENCOUNTER — Other Ambulatory Visit: Payer: Self-pay

## 2020-09-06 ENCOUNTER — Inpatient Hospital Stay (HOSPITAL_COMMUNITY)
Admission: AD | Admit: 2020-09-06 | Discharge: 2020-09-06 | Disposition: A | Discharge status: Home / Self Care | Payer: Medicaid Other | Attending: Obstetrics and Gynecology | Admitting: Obstetrics and Gynecology

## 2020-09-06 ENCOUNTER — Ambulatory Visit: Payer: Medicaid Other | Attending: Obstetrics and Gynecology | Admitting: *Deleted

## 2020-09-06 ENCOUNTER — Encounter (HOSPITAL_COMMUNITY): Payer: Self-pay | Admitting: Obstetrics and Gynecology

## 2020-09-06 ENCOUNTER — Ambulatory Visit (HOSPITAL_BASED_OUTPATIENT_CLINIC_OR_DEPARTMENT_OTHER): Payer: Medicaid Other

## 2020-09-06 DIAGNOSIS — O10012 Pre-existing essential hypertension complicating pregnancy, second trimester: Secondary | ICD-10-CM

## 2020-09-06 DIAGNOSIS — O99712 Diseases of the skin and subcutaneous tissue complicating pregnancy, second trimester: Secondary | ICD-10-CM

## 2020-09-06 DIAGNOSIS — Z3A26 26 weeks gestation of pregnancy: Secondary | ICD-10-CM | POA: Insufficient documentation

## 2020-09-06 DIAGNOSIS — E669 Obesity, unspecified: Secondary | ICD-10-CM | POA: Insufficient documentation

## 2020-09-06 DIAGNOSIS — Z362 Encounter for other antenatal screening follow-up: Secondary | ICD-10-CM

## 2020-09-06 DIAGNOSIS — L299 Pruritus, unspecified: Secondary | ICD-10-CM

## 2020-09-06 DIAGNOSIS — Z79899 Other long term (current) drug therapy: Secondary | ICD-10-CM | POA: Insufficient documentation

## 2020-09-06 DIAGNOSIS — O365920 Maternal care for other known or suspected poor fetal growth, second trimester, not applicable or unspecified: Secondary | ICD-10-CM | POA: Insufficient documentation

## 2020-09-06 DIAGNOSIS — O10912 Unspecified pre-existing hypertension complicating pregnancy, second trimester: Secondary | ICD-10-CM | POA: Insufficient documentation

## 2020-09-06 DIAGNOSIS — O10013 Pre-existing essential hypertension complicating pregnancy, third trimester: Secondary | ICD-10-CM

## 2020-09-06 DIAGNOSIS — O99213 Obesity complicating pregnancy, third trimester: Secondary | ICD-10-CM

## 2020-09-06 DIAGNOSIS — Z87891 Personal history of nicotine dependence: Secondary | ICD-10-CM | POA: Insufficient documentation

## 2020-09-06 DIAGNOSIS — Z87798 Personal history of other (corrected) congenital malformations: Secondary | ICD-10-CM | POA: Insufficient documentation

## 2020-09-06 DIAGNOSIS — O10919 Unspecified pre-existing hypertension complicating pregnancy, unspecified trimester: Secondary | ICD-10-CM

## 2020-09-06 DIAGNOSIS — R109 Unspecified abdominal pain: Secondary | ICD-10-CM | POA: Diagnosis not present

## 2020-09-06 DIAGNOSIS — O99212 Obesity complicating pregnancy, second trimester: Secondary | ICD-10-CM

## 2020-09-06 DIAGNOSIS — O26892 Other specified pregnancy related conditions, second trimester: Secondary | ICD-10-CM | POA: Diagnosis not present

## 2020-09-06 DIAGNOSIS — Z7982 Long term (current) use of aspirin: Secondary | ICD-10-CM | POA: Diagnosis not present

## 2020-09-06 DIAGNOSIS — F419 Anxiety disorder, unspecified: Secondary | ICD-10-CM

## 2020-09-06 DIAGNOSIS — O99342 Other mental disorders complicating pregnancy, second trimester: Secondary | ICD-10-CM

## 2020-09-06 DIAGNOSIS — Z8616 Personal history of COVID-19: Secondary | ICD-10-CM

## 2020-09-06 DIAGNOSIS — O212 Late vomiting of pregnancy: Secondary | ICD-10-CM | POA: Diagnosis not present

## 2020-09-06 DIAGNOSIS — O99210 Obesity complicating pregnancy, unspecified trimester: Secondary | ICD-10-CM

## 2020-09-06 LAB — URINALYSIS, ROUTINE W REFLEX MICROSCOPIC
Bilirubin Urine: NEGATIVE
Glucose, UA: NEGATIVE mg/dL
Hgb urine dipstick: NEGATIVE
Ketones, ur: NEGATIVE mg/dL
Nitrite: NEGATIVE
Protein, ur: NEGATIVE mg/dL
Specific Gravity, Urine: 1.015 (ref 1.005–1.030)
pH: 6.5 (ref 5.0–8.0)

## 2020-09-06 LAB — URINALYSIS, MICROSCOPIC (REFLEX): RBC / HPF: NONE SEEN RBC/hpf (ref 0–5)

## 2020-09-06 MED ORDER — BETAMETHASONE SOD PHOS & ACET 6 (3-3) MG/ML IJ SUSP
12.0000 mg | Freq: Once | INTRAMUSCULAR | Status: AC
  Administered 2020-09-06: 12 mg via INTRAMUSCULAR
  Filled 2020-09-06: qty 5

## 2020-09-06 NOTE — Progress Notes (Signed)
C/o "nausea, itching hands and feet mostly at night x 2 days."

## 2020-09-06 NOTE — [facility] Note (Addendum)
...  Alyssa Ray is a 30 y.o. at [redacted]w[redacted]d here in [facility]U reporting: "I went to my U/S appointment and they noticed he was underweight and that his umbilical cord blood flow was limited as well as low amniotic fluid so I was sent here for BMZ and to be monitored." +FM but reports it is less than normal. Reporting intermittent bilateral upper rib pain. Also states she has itching of the hands and feet that is worse at night for the past two days. No VB or LOF.   BP: 153/79 P: 69 T: 98.1 R: 19 O2: 99%  Pain score: 5/10 intermittent bilateral upper rib pain FHR: unable to assess in triage Lab orders placed from triage: UA

## 2020-09-06 NOTE — Procedures (Addendum)
Leonda Cristo 1990/07/04 [redacted]w[redacted]d  Fetus A Non-Stress Test Interpretation for 09/06/20  Indication:  Abnormal U/S  Fetal Heart Rate A Mode: External Baseline Rate (A): 145 bpm (Only able to trace FHR x 2 min due to patient habitus.) Variability:  (Unable to determine due to only 2 min monitor time.) Accelerations:  (Unable to assess) Decelerations:  (unable to assess) Multiple birth?: No  Uterine Activity Mode: Palpation Contraction Frequency (min): none Resting Tone Palpated: Relaxed Resting Time: Adequate  Interpretation (Fetal Testing) Nonstress Test Interpretation:  (Inconclusive due to only able to trace FHR x 2 min.) Overall Impression:  (Inconclusive) Comments: Dr. Grace Bushy aware of only 2 min tracing, reviewed tracing.  Ms. Abdulaziz was sent to MAU for further monitoring, BMZ and hydration per Dr. Grace Bushy.

## 2020-09-06 NOTE — Discharge Instructions (Signed)
Fetal Growth Restriction  Fetal growth restriction, also known as intrauterine growth restriction (IUGR), is when a baby is not growing normally during pregnancy. A baby with fetal growth restriction is smaller than he or she should be and may weigh less than normal at birth. Fetal growth restriction can result from a problem with the placenta, which is an organ that supplies the unborn baby (fetus) with oxygen and nutrition. Babies with fetal growth restriction are at higher risk for early delivery and may need more care than usual after birth. What are the causes? The most common cause of fetal growth restriction is a problem with the placenta or umbilical cord that causes the fetus to get less oxygen or nutrition than needed. Other causes include:  Poor maternal nutrition and not enough weight gain during pregnancy.  Exposure to chemicals found in substances such as cigarettes, alcohol, and some drugs.  Some prescription medicines.  Other problems that develop in the womb (congenital birth defects).  Genetic disorders.  Infection.  Carrying more than one baby. What increases the risk? This condition is more likely to affect your baby if you:  Are older than age 58 or younger than age 11.  Have medical conditions such as high blood pressure, preeclampsia, diabetes, heart or kidney disease, systemic lupus erythematosus, or anemia.  Live at a very high altitude during pregnancy.  Have a personal history or family history of: ? Fetal growth restriction. ? A genetic disorder.  Have had treatments to help have children (infertility treatments). What are the signs or symptoms? Fetal growth restriction does not cause many symptoms. Your health care provider may suspect this condition if your belly area (fundus) is not as big as expected for the stage of your pregnancy. How is this diagnosed? This condition is diagnosed with physical exams and prenatal exams. You may also  have:  Fundal height measurements to check the size of your uterus. The fundal height is the distance from the pubic bone to the top of the uterus.  An ultrasound done to measure your baby's size compared to the size of other babies at the same stage of development (gestational age). You may also have tests to find the cause of fetal growth restriction. These may include:  Amniocentesis. This is a procedure that involves passing a needle into the uterus to collect a sample of fluid that surrounds the fetus (amniotic fluid). This may be done to check for signs of infection or congenital defects.  Tests to evaluate blood flow to your baby and placenta. How is this treated? In most cases, the goal of treatment is to treat the cause of fetal growth restriction. Your health care providers will monitor your pregnancy closely and help you manage your pregnancy. If your condition is caused by a problem with the placenta and your baby is not getting enough blood, you may need:  Medicine to start labor and deliver your baby early (induction).  Cesarean delivery, also called a C-section. In this procedure, your baby is delivered through an incision in your abdomen and uterus. Follow these instructions at home: Medicines  Take over-the-counter and prescription medicines only as told by your health care provider. This includes vitamins and supplements.  Make sure that your health care provider knows about and approves of all medicines, supplements, vitamins, eye drops, and creams that you use. General instructions  Eat a healthy diet that includes fresh fruits and vegetables, lean proteins, whole grains, and calcium-rich foods such as milk, yogurt, and  dark, leafy greens. Work with your health care provider or a dietitian to make sure that: ? You are getting enough nutrients. ? You are gaining enough weight during your pregnancy.  Rest as needed. Try to get at least 8 hours of sleep every  night.  Do not drink alcohol or use drugs.  Do not use any products that contain nicotine or tobacco. These products include cigarettes, chewing tobacco, and vaping devices, such as e-cigarettes. If you need help quitting, ask your health care provider.  Keep all follow-up visits. This is important. Get help right away if:  You notice that your unborn baby is moving less than usual or is not moving.  You have contractions that are 5 minutes or less apart, or that increase in frequency, intensity, or length.  You have signs and symptoms of infection, including a fever.  You have vaginal bleeding.  You have increased swelling in your legs, hands, or face.  You have vision changes, including seeing spots or having blurry or double vision.  You have a severe headache that does not go away.  You have a sudden, sharp pain in the abdomen or low back pain.  You have an uncontrolled gush or trickle of fluid from your vagina. Summary  Fetal growth restriction is when a baby is not growing normally during pregnancy.  The most common cause of fetal growth restriction is a problem with the placenta or umbilical cord that causes the fetus to get less oxygen or nutrition than needed.  This condition is diagnosed with physical and prenatal exams.  Your health care provider will monitor your baby's growth with ultrasounds throughout pregnancy.  Make sure that your health care provider knows about and approves of all medicines, supplements, vitamins, eye drops, and creams that you use. This information is not intended to replace advice given to you by your health care provider. Make sure you discuss any questions you have with your health care provider. Document Revised: 01/24/2020 Document Reviewed: 01/24/2020 Elsevier Patient Education  2021 [employer].

## 2020-09-06 NOTE — Progress Notes (Signed)
Spoke with Dr. Grace Bushy to clarify orders for 2x week NST and 1x week AFI.  Per Dr. Grace Bushy, MFM will do doppler studies and NST once each week.  [facility] for [facility] will do NST and AFI (not BPP) once per week.  Scheduled accordingly.
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
## 2020-09-07 ENCOUNTER — Inpatient Hospital Stay (HOSPITAL_COMMUNITY)
Admission: AD | Admit: 2020-09-07 | Discharge: 2020-09-07 | Disposition: A | Discharge status: Home / Self Care | Payer: Medicaid Other | Attending: Obstetrics & Gynecology | Admitting: Obstetrics & Gynecology

## 2020-09-07 ENCOUNTER — Other Ambulatory Visit: Payer: Self-pay

## 2020-09-07 ENCOUNTER — Encounter (HOSPITAL_COMMUNITY): Payer: Self-pay | Admitting: Obstetrics & Gynecology

## 2020-09-07 ENCOUNTER — Ambulatory Visit: Payer: Self-pay

## 2020-09-07 DIAGNOSIS — R1031 Right lower quadrant pain: Secondary | ICD-10-CM | POA: Diagnosis not present

## 2020-09-07 DIAGNOSIS — Z885 Allergy status to narcotic agent status: Secondary | ICD-10-CM | POA: Insufficient documentation

## 2020-09-07 DIAGNOSIS — Z888 Allergy status to other drugs, medicaments and biological substances status: Secondary | ICD-10-CM | POA: Diagnosis not present

## 2020-09-07 DIAGNOSIS — R109 Unspecified abdominal pain: Secondary | ICD-10-CM

## 2020-09-07 DIAGNOSIS — N3000 Acute cystitis without hematuria: Secondary | ICD-10-CM

## 2020-09-07 DIAGNOSIS — R1011 Right upper quadrant pain: Secondary | ICD-10-CM | POA: Insufficient documentation

## 2020-09-07 DIAGNOSIS — R1032 Left lower quadrant pain: Secondary | ICD-10-CM | POA: Diagnosis not present

## 2020-09-07 DIAGNOSIS — O368120 Decreased fetal movements, second trimester, not applicable or unspecified: Secondary | ICD-10-CM | POA: Insufficient documentation

## 2020-09-07 DIAGNOSIS — R1012 Left upper quadrant pain: Secondary | ICD-10-CM | POA: Diagnosis not present

## 2020-09-07 DIAGNOSIS — Z3A26 26 weeks gestation of pregnancy: Secondary | ICD-10-CM

## 2020-09-07 DIAGNOSIS — Z87891 Personal history of nicotine dependence: Secondary | ICD-10-CM | POA: Insufficient documentation

## 2020-09-07 DIAGNOSIS — Z79899 Other long term (current) drug therapy: Secondary | ICD-10-CM | POA: Diagnosis not present

## 2020-09-07 DIAGNOSIS — R102 Pelvic and perineal pain: Secondary | ICD-10-CM | POA: Diagnosis not present

## 2020-09-07 DIAGNOSIS — O365920 Maternal care for other known or suspected poor fetal growth, second trimester, not applicable or unspecified: Secondary | ICD-10-CM | POA: Insufficient documentation

## 2020-09-07 DIAGNOSIS — O99891 Other specified diseases and conditions complicating pregnancy: Secondary | ICD-10-CM | POA: Diagnosis not present

## 2020-09-07 DIAGNOSIS — O26892 Other specified pregnancy related conditions, second trimester: Secondary | ICD-10-CM | POA: Diagnosis not present

## 2020-09-07 DIAGNOSIS — R110 Nausea: Secondary | ICD-10-CM | POA: Diagnosis not present

## 2020-09-07 DIAGNOSIS — O212 Late vomiting of pregnancy: Secondary | ICD-10-CM

## 2020-09-07 DIAGNOSIS — Z7982 Long term (current) use of aspirin: Secondary | ICD-10-CM | POA: Diagnosis not present

## 2020-09-07 LAB — URINALYSIS, ROUTINE W REFLEX MICROSCOPIC
Bilirubin Urine: NEGATIVE
Glucose, UA: NEGATIVE mg/dL
Hgb urine dipstick: NEGATIVE
Ketones, ur: NEGATIVE mg/dL
Nitrite: NEGATIVE
Protein, ur: 30 mg/dL — AB
Specific Gravity, Urine: 1.019 (ref 1.005–1.030)
pH: 6 (ref 5.0–8.0)

## 2020-09-07 LAB — CBC WITH DIFFERENTIAL/PLATELET
Abs Immature Granulocytes: 0.2 10*3/uL — ABNORMAL HIGH (ref 0.00–0.07)
Basophils Absolute: 0 10*3/uL (ref 0.0–0.1)
Basophils Relative: 0 %
Eosinophils Absolute: 0 10*3/uL (ref 0.0–0.5)
Eosinophils Relative: 0 %
HCT: 36.1 % (ref 36.0–46.0)
Hemoglobin: 11.9 g/dL — ABNORMAL LOW (ref 12.0–15.0)
Immature Granulocytes: 1 %
Lymphocytes Relative: 13 %
Lymphs Abs: 2.3 10*3/uL (ref 0.7–4.0)
MCH: 27 pg (ref 26.0–34.0)
MCHC: 33 g/dL (ref 30.0–36.0)
MCV: 82 fL (ref 80.0–100.0)
Monocytes Absolute: 0.7 10*3/uL (ref 0.1–1.0)
Monocytes Relative: 4 %
Neutro Abs: 14.8 10*3/uL — ABNORMAL HIGH (ref 1.7–7.7)
Neutrophils Relative %: 82 %
Platelets: 226 10*3/uL (ref 150–400)
RBC: 4.4 MIL/uL (ref 3.87–5.11)
RDW: 15.5 % (ref 11.5–15.5)
WBC: 18.1 10*3/uL — ABNORMAL HIGH (ref 4.0–10.5)
nRBC: 0 % (ref 0.0–0.2)

## 2020-09-07 LAB — BILE ACIDS, TOTAL: Bile Acids Total: 1.7 umol/L (ref 0.0–10.0)

## 2020-09-07 MED ORDER — ONDANSETRON 4 MG PO TBDP
8.0000 mg | ORAL_TABLET | Freq: Once | ORAL | Status: AC
  Administered 2020-09-07: 8 mg via ORAL
  Filled 2020-09-07: qty 2

## 2020-09-07 MED ORDER — SODIUM CHLORIDE 0.9 % IV SOLN: INTRAVENOUS | Status: DC

## 2020-09-07 MED ORDER — LACTATED RINGERS IV SOLN: Freq: Once | INTRAVENOUS | Status: DC

## 2020-09-07 MED ORDER — BETAMETHASONE SOD PHOS & ACET 6 (3-3) MG/ML IJ SUSP
12.0000 mg | Freq: Once | INTRAMUSCULAR | Status: AC
  Administered 2020-09-07: 12 mg via INTRAMUSCULAR
  Filled 2020-09-07: qty 5

## 2020-09-07 MED ORDER — SODIUM CHLORIDE 0.9 % IV SOLN
2.0000 g | Freq: Once | INTRAVENOUS | Status: AC
  Administered 2020-09-07: 2 g via INTRAVENOUS
  Filled 2020-09-07: qty 2

## 2020-09-07 MED ORDER — CEFADROXIL 500 MG PO CAPS: 500.0000 mg | ORAL_CAPSULE | Freq: Two times a day (BID) | ORAL | 0 refills | Status: DC

## 2020-09-07 MED ORDER — HYOSCYAMINE SULFATE 0.125 MG SL SUBL
0.1250 mg | SUBLINGUAL_TABLET | Freq: Once | SUBLINGUAL | Status: AC
  Administered 2020-09-07: 0.125 mg via SUBLINGUAL
  Filled 2020-09-07: qty 1
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
## 2020-09-07 NOTE — MAU Note (Signed)
Pt presents to MAU c/o decreased fetal movement, states she felt her baby move once yesterday afternoon and has not felt them move at all last night or this morning. Pt also c/o lateral abdominal pain beginning this morning around 2am.

## 2020-09-08 ENCOUNTER — Ambulatory Visit: Payer: Medicaid Other

## 2020-09-08 ENCOUNTER — Other Ambulatory Visit: Payer: Self-pay

## 2020-09-08 ENCOUNTER — Ambulatory Visit (INDEPENDENT_AMBULATORY_CARE_PROVIDER_SITE_OTHER): Payer: Medicaid Other

## 2020-09-08 ENCOUNTER — Ambulatory Visit (INDEPENDENT_AMBULATORY_CARE_PROVIDER_SITE_OTHER): Payer: Medicaid Other | Admitting: *Deleted

## 2020-09-08 VITALS — BP 144/90 | HR 54 | Wt 301.2 lb

## 2020-09-08 DIAGNOSIS — O4102X0 Oligohydramnios, second trimester, not applicable or unspecified: Secondary | ICD-10-CM | POA: Diagnosis not present

## 2020-09-08 DIAGNOSIS — O288 Other abnormal findings on antenatal screening of mother: Secondary | ICD-10-CM | POA: Diagnosis not present

## 2020-09-08 DIAGNOSIS — O43109 Malformation of placenta, unspecified, unspecified trimester: Secondary | ICD-10-CM

## 2020-09-08 DIAGNOSIS — Z3A26 26 weeks gestation of pregnancy: Secondary | ICD-10-CM | POA: Diagnosis not present

## 2020-09-08 DIAGNOSIS — O10919 Unspecified pre-existing hypertension complicating pregnancy, unspecified trimester: Secondary | ICD-10-CM

## 2020-09-08 DIAGNOSIS — O10912 Unspecified pre-existing hypertension complicating pregnancy, second trimester: Secondary | ICD-10-CM

## 2020-09-08 LAB — CULTURE, OB URINE

## 2020-09-08 MED ORDER — FAMOTIDINE 20 MG PO TABS: 20.0000 mg | ORAL_TABLET | Freq: Every day | ORAL | 2 refills | Status: DC

## 2020-09-08 NOTE — Progress Notes (Signed)
Pt denies H/A or visual disturbances. Pt reports frequent heartburn which has gotten progressively worse. She requests Rx.

## 2020-09-09 LAB — COMPREHENSIVE METABOLIC PANEL
ALT: 6 IU/L (ref 0–32)
AST: 12 IU/L (ref 0–40)
Albumin/Globulin Ratio: 1.4 (ref 1.2–2.2)
Albumin: 3.6 g/dL — ABNORMAL LOW (ref 3.9–5.0)
Alkaline Phosphatase: 390 IU/L — ABNORMAL HIGH (ref 44–121)
BUN/Creatinine Ratio: 15 (ref 9–23)
BUN: 8 mg/dL (ref 6–20)
Bilirubin Total: <0.2 mg/dL (ref 0.0–1.2)
CO2: 19 mmol/L — ABNORMAL LOW (ref 20–29)
Calcium: 8.8 mg/dL (ref 8.7–10.2)
Chloride: 104 mmol/L (ref 96–106)
Creatinine, Ser: 0.52 mg/dL — ABNORMAL LOW (ref 0.57–1.00)
Globulin, Total: 2.5 g/dL (ref 1.5–4.5)
Glucose: 83 mg/dL (ref 65–99)
Potassium: 4.5 mmol/L (ref 3.5–5.2)
Sodium: 139 mmol/L (ref 134–144)
Total Protein: 6.1 g/dL (ref 6.0–8.5)
eGFR: 130 mL/min/{1.73_m2} (ref >59)

## 2020-09-09 LAB — CBC
Hematocrit: 37.4 % (ref 34.0–46.6)
Hemoglobin: 11.7 g/dL (ref 11.1–15.9)
MCH: 26.4 pg — ABNORMAL LOW (ref 26.6–33.0)
MCHC: 31.3 g/dL — ABNORMAL LOW (ref 31.5–35.7)
MCV: 84 fL (ref 79–97)
Platelets: 246 10*3/uL (ref 150–450)
RBC: 4.44 x10E6/uL (ref 3.77–5.28)
RDW: 15.5 % — ABNORMAL HIGH (ref 11.7–15.4)
WBC: 16.7 10*3/uL — ABNORMAL HIGH (ref 3.4–10.8)

## 2020-09-09 LAB — PROTEIN / CREATININE RATIO, URINE
Creatinine, Urine: 101.6 mg/dL
Protein, Ur: 22.6 mg/dL
Protein/Creat Ratio: 222 mg/g creat — ABNORMAL HIGH (ref 0–200)

## 2020-09-11 ENCOUNTER — Encounter (HOSPITAL_COMMUNITY): Payer: Self-pay | Admitting: Obstetrics & Gynecology

## 2020-09-11 ENCOUNTER — Inpatient Hospital Stay (HOSPITAL_COMMUNITY)
Admission: AD | Admit: 2020-09-11 | Discharge: 2020-09-21 | DRG: 788 | Disposition: A | Discharge status: Home / Self Care | Payer: Medicaid Other | Attending: Obstetrics and Gynecology | Admitting: Obstetrics and Gynecology

## 2020-09-11 ENCOUNTER — Ambulatory Visit: Payer: Self-pay

## 2020-09-11 ENCOUNTER — Observation Stay (HOSPITAL_COMMUNITY): Payer: Medicaid Other

## 2020-09-11 ENCOUNTER — Other Ambulatory Visit: Payer: Self-pay

## 2020-09-11 ENCOUNTER — Encounter: Payer: Self-pay | Admitting: *Deleted

## 2020-09-11 ENCOUNTER — Ambulatory Visit: Payer: Medicaid Other | Admitting: *Deleted

## 2020-09-11 ENCOUNTER — Ambulatory Visit (HOSPITAL_BASED_OUTPATIENT_CLINIC_OR_DEPARTMENT_OTHER): Payer: Medicaid Other

## 2020-09-11 VITALS — BP 176/99

## 2020-09-11 DIAGNOSIS — O10919 Unspecified pre-existing hypertension complicating pregnancy, unspecified trimester: Secondary | ICD-10-CM | POA: Diagnosis present

## 2020-09-11 DIAGNOSIS — O365920 Maternal care for other known or suspected poor fetal growth, second trimester, not applicable or unspecified: Secondary | ICD-10-CM

## 2020-09-11 DIAGNOSIS — Z23 Encounter for immunization: Secondary | ICD-10-CM

## 2020-09-11 DIAGNOSIS — O4102X0 Oligohydramnios, second trimester, not applicable or unspecified: Secondary | ICD-10-CM | POA: Diagnosis not present

## 2020-09-11 DIAGNOSIS — O99212 Obesity complicating pregnancy, second trimester: Secondary | ICD-10-CM

## 2020-09-11 DIAGNOSIS — Z3A27 27 weeks gestation of pregnancy: Secondary | ICD-10-CM

## 2020-09-11 DIAGNOSIS — O10913 Unspecified pre-existing hypertension complicating pregnancy, third trimester: Secondary | ICD-10-CM

## 2020-09-11 DIAGNOSIS — F419 Anxiety disorder, unspecified: Secondary | ICD-10-CM

## 2020-09-11 DIAGNOSIS — F32A Depression, unspecified: Secondary | ICD-10-CM | POA: Diagnosis present

## 2020-09-11 DIAGNOSIS — Z8616 Personal history of COVID-19: Secondary | ICD-10-CM

## 2020-09-11 DIAGNOSIS — O288 Other abnormal findings on antenatal screening of mother: Secondary | ICD-10-CM

## 2020-09-11 DIAGNOSIS — O321XX0 Maternal care for breech presentation, not applicable or unspecified: Secondary | ICD-10-CM

## 2020-09-11 DIAGNOSIS — O99210 Obesity complicating pregnancy, unspecified trimester: Secondary | ICD-10-CM

## 2020-09-11 DIAGNOSIS — O99213 Obesity complicating pregnancy, third trimester: Secondary | ICD-10-CM

## 2020-09-11 DIAGNOSIS — Z87798 Personal history of other (corrected) congenital malformations: Secondary | ICD-10-CM

## 2020-09-11 DIAGNOSIS — O43109 Malformation of placenta, unspecified, unspecified trimester: Secondary | ICD-10-CM

## 2020-09-11 DIAGNOSIS — O1002 Pre-existing essential hypertension complicating childbirth: Principal | ICD-10-CM | POA: Diagnosis present

## 2020-09-11 DIAGNOSIS — O99214 Obesity complicating childbirth: Secondary | ICD-10-CM | POA: Diagnosis present

## 2020-09-11 DIAGNOSIS — O98512 Other viral diseases complicating pregnancy, second trimester: Secondary | ICD-10-CM | POA: Diagnosis present

## 2020-09-11 DIAGNOSIS — Z87891 Personal history of nicotine dependence: Secondary | ICD-10-CM

## 2020-09-11 DIAGNOSIS — O99344 Other mental disorders complicating childbirth: Secondary | ICD-10-CM | POA: Diagnosis present

## 2020-09-11 DIAGNOSIS — Z7982 Long term (current) use of aspirin: Secondary | ICD-10-CM

## 2020-09-11 DIAGNOSIS — O10012 Pre-existing essential hypertension complicating pregnancy, second trimester: Secondary | ICD-10-CM | POA: Diagnosis not present

## 2020-09-11 DIAGNOSIS — O99342 Other mental disorders complicating pregnancy, second trimester: Secondary | ICD-10-CM

## 2020-09-11 DIAGNOSIS — O10013 Pre-existing essential hypertension complicating pregnancy, third trimester: Secondary | ICD-10-CM

## 2020-09-11 DIAGNOSIS — IMO0002 Reserved for concepts with insufficient information to code with codable children: Secondary | ICD-10-CM | POA: Diagnosis present

## 2020-09-11 DIAGNOSIS — U071 Other viral diseases complicating pregnancy, second trimester: Secondary | ICD-10-CM | POA: Diagnosis present

## 2020-09-11 DIAGNOSIS — O0990 Supervision of high risk pregnancy, unspecified, unspecified trimester: Secondary | ICD-10-CM

## 2020-09-11 HISTORY — DX: Unspecified pre-existing hypertension complicating pregnancy, unspecified trimester: O10.919

## 2020-09-11 LAB — URINALYSIS, ROUTINE W REFLEX MICROSCOPIC
Bilirubin Urine: NEGATIVE
Glucose, UA: NEGATIVE mg/dL
Hgb urine dipstick: NEGATIVE
Ketones, ur: NEGATIVE mg/dL
Leukocytes,Ua: NEGATIVE
Nitrite: NEGATIVE
Protein, ur: NEGATIVE mg/dL
Specific Gravity, Urine: 1.011 (ref 1.005–1.030)
pH: 6 (ref 5.0–8.0)

## 2020-09-11 LAB — COMPREHENSIVE METABOLIC PANEL
ALT: 13 U/L (ref 0–44)
AST: 16 U/L (ref 15–41)
Albumin: 2.6 g/dL — ABNORMAL LOW (ref 3.5–5.0)
Alkaline Phosphatase: 431 U/L — ABNORMAL HIGH (ref 38–126)
Anion gap: 7 (ref 5–15)
BUN: 10 mg/dL (ref 6–20)
CO2: 22 mmol/L (ref 22–32)
Calcium: 8.7 mg/dL — ABNORMAL LOW (ref 8.9–10.3)
Chloride: 106 mmol/L (ref 98–111)
Creatinine, Ser: 0.62 mg/dL (ref 0.44–1.00)
GFR, Estimated: >60 mL/min (ref >60)
Glucose, Bld: 106 mg/dL — ABNORMAL HIGH (ref 70–99)
Potassium: 3.6 mmol/L (ref 3.5–5.1)
Sodium: 135 mmol/L (ref 135–145)
Total Bilirubin: 0.5 mg/dL (ref 0.3–1.2)
Total Protein: 5.7 g/dL — ABNORMAL LOW (ref 6.5–8.1)

## 2020-09-11 LAB — PROTEIN / CREATININE RATIO, URINE
Creatinine, Urine: 69.88 mg/dL
Protein Creatinine Ratio: 0.2 mg/mg{Cre} — ABNORMAL HIGH (ref 0.00–0.15)
Total Protein, Urine: 14 mg/dL

## 2020-09-11 LAB — CBC
HCT: 37.9 % (ref 36.0–46.0)
Hemoglobin: 12.7 g/dL (ref 12.0–15.0)
MCH: 27.1 pg (ref 26.0–34.0)
MCHC: 33.5 g/dL (ref 30.0–36.0)
MCV: 81 fL (ref 80.0–100.0)
Platelets: 228 10*3/uL (ref 150–400)
RBC: 4.68 MIL/uL (ref 3.87–5.11)
RDW: 15.5 % (ref 11.5–15.5)
WBC: 14.3 10*3/uL — ABNORMAL HIGH (ref 4.0–10.5)
nRBC: 0.1 % (ref 0.0–0.2)

## 2020-09-11 LAB — TYPE AND SCREEN
ABO/RH(D): A POS
Antibody Screen: NEGATIVE

## 2020-09-11 MED ORDER — ACETAMINOPHEN 325 MG PO TABS
650.0000 mg | ORAL_TABLET | ORAL | Status: DC | PRN
  Administered 2020-09-11 – 2020-09-16 (×7): 650 mg via ORAL
  Filled 2020-09-11 (×7): qty 2

## 2020-09-11 MED ORDER — ASPIRIN EC 81 MG PO TBEC
81.0000 mg | DELAYED_RELEASE_TABLET | Freq: Every day | ORAL | Status: DC
  Administered 2020-09-11 – 2020-09-17 (×7): 81 mg via ORAL
  Filled 2020-09-11 (×7): qty 1

## 2020-09-11 MED ORDER — PRENATAL MULTIVITAMIN CH
1.0000 | ORAL_TABLET | Freq: Every day | ORAL | Status: DC
  Administered 2020-09-11 – 2020-09-17 (×7): 1 via ORAL
  Filled 2020-09-11 (×7): qty 1

## 2020-09-11 MED ORDER — LABETALOL HCL 5 MG/ML IV SOLN: 40.0000 mg | INTRAVENOUS | Status: DC | PRN

## 2020-09-11 MED ORDER — CYCLOBENZAPRINE HCL 10 MG PO TABS
5.0000 mg | ORAL_TABLET | Freq: Once | ORAL | Status: AC
  Administered 2020-09-11: 5 mg via ORAL
  Filled 2020-09-11: qty 1

## 2020-09-11 MED ORDER — BETAMETHASONE SOD PHOS & ACET 6 (3-3) MG/ML IJ SUSP
12.0000 mg | INTRAMUSCULAR | Status: DC
  Filled 2020-09-11: qty 5

## 2020-09-11 MED ORDER — HYDRALAZINE HCL 20 MG/ML IJ SOLN: 10.0000 mg | INTRAMUSCULAR | Status: DC | PRN

## 2020-09-11 MED ORDER — ONDANSETRON HCL 4 MG PO TABS
4.0000 mg | ORAL_TABLET | Freq: Three times a day (TID) | ORAL | Status: DC | PRN
  Administered 2020-09-11: 4 mg via ORAL
  Filled 2020-09-11 (×2): qty 1

## 2020-09-11 MED ORDER — ONDANSETRON HCL 4 MG/2ML IJ SOLN
4.0000 mg | Freq: Four times a day (QID) | INTRAMUSCULAR | Status: DC | PRN
  Administered 2020-09-11 – 2020-09-12 (×2): 4 mg via INTRAVENOUS
  Filled 2020-09-11 (×2): qty 2

## 2020-09-11 MED ORDER — NIFEDIPINE ER OSMOTIC RELEASE 30 MG PO TB24
30.0000 mg | ORAL_TABLET | Freq: Every day | ORAL | Status: DC
Start: 2020-09-11 — End: 2020-09-18
  Administered 2020-09-11 – 2020-09-17 (×7): 30 mg via ORAL
  Filled 2020-09-11 (×7): qty 1

## 2020-09-11 MED ORDER — LABETALOL HCL 5 MG/ML IV SOLN: 80.0000 mg | INTRAVENOUS | Status: DC | PRN

## 2020-09-11 MED ORDER — FAMOTIDINE 20 MG PO TABS
20.0000 mg | ORAL_TABLET | Freq: Two times a day (BID) | ORAL | Status: DC
  Administered 2020-09-11 – 2020-09-17 (×14): 20 mg via ORAL
  Filled 2020-09-11 (×14): qty 1

## 2020-09-11 MED ORDER — FAMOTIDINE 20 MG PO TABS: 20.0000 mg | ORAL_TABLET | Freq: Every day | ORAL | Status: DC

## 2020-09-11 MED ORDER — ZOLPIDEM TARTRATE 5 MG PO TABS
5.0000 mg | ORAL_TABLET | Freq: Every evening | ORAL | Status: DC | PRN
  Administered 2020-09-14 – 2020-09-16 (×2): 5 mg via ORAL
  Filled 2020-09-11 (×2): qty 1

## 2020-09-11 MED ORDER — DOCUSATE SODIUM 100 MG PO CAPS
100.0000 mg | ORAL_CAPSULE | Freq: Every day | ORAL | Status: DC
  Administered 2020-09-11 – 2020-09-17 (×7): 100 mg via ORAL
  Filled 2020-09-11 (×7): qty 1

## 2020-09-11 MED ORDER — SERTRALINE HCL 50 MG PO TABS
50.0000 mg | ORAL_TABLET | Freq: Every day | ORAL | Status: DC
  Administered 2020-09-11: 50 mg via ORAL
  Filled 2020-09-11: qty 1

## 2020-09-11 MED ORDER — SODIUM CHLORIDE 0.9 % IV SOLN
25.0000 mg | Freq: Four times a day (QID) | INTRAVENOUS | Status: DC | PRN
  Administered 2020-09-11 – 2020-09-13 (×4): 25 mg via INTRAVENOUS
  Filled 2020-09-11 (×4): qty 1

## 2020-09-11 MED ORDER — LABETALOL HCL 5 MG/ML IV SOLN: 20.0000 mg | INTRAVENOUS | Status: DC | PRN

## 2020-09-11 MED ORDER — CALCIUM CARBONATE ANTACID 500 MG PO CHEW: 2.0000 | CHEWABLE_TABLET | ORAL | Status: DC | PRN

## 2020-09-11 NOTE — Progress Notes (Signed)
Late entry at 1600:  Called to bedside due to lower abdominal pain.  Pt notes bilateral discomfort that she rates 8/10 and feels like the pain is constant. Not sure what makes it worse or better. Some mild nausea, no vomiting.  Tolerating general diet.  Denies vaginal bleeding.  Good fetal movement.  No other acute complaints  On exam: abdomen- soft and non-tender, gravid, no rebound, no guarding, Pt was just removed off toco- no contractions noted  A/P:  Reassured pt that no acute abnormalities were noted on exam.  Will apply heating pack and continue to monitor closely.  Myna Hidalgo, DO Attending Obstetrician & Gynecologist, [facility] for [employer], [facility]

## 2020-09-11 NOTE — H&P (Signed)
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 ---------------------------------------------------------------------- Orders  #  Description                           Code        Ordered By  1  [place] MFM OB FOLLOW UP                   76816.01    RAVI SHANKAR  2  [place] MFM UA CORD DOPPLER                76820.02    RAVI [facility]  ----------------------------------------------------------------------  #  Order #                     Accession #                Episode #  1  [id number]                   [id number]                 [id number]  2  [id number]                   [id number]                 [id number] ---------------------------------------------------------------------- Indications  Antenatal follow-up for nonvisualized fetal    Z36.2  anatomy  Hypertension - Chronic/Pre-existing            Z66.063  Medical complication of pregnancy (Covid 19    O26.90  + January 2022)  LR NIPS  Obesity complicating pregnancy, second         O99.212  trimester (BMI 49)  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 Cord Insertion:      Previously Visualized  Amniotic Fluid  AFI FV:      Subjectively decreased                              Largest Pocket(cm)  3.17 ---------------------------------------------------------------------- Biometry  BPD:        59  mm     G. Age:  24w 1d        < 1  %    CI:        67.53   %    70 - 86                                                          FL/HC:      21.3   %    18.6 - 20.4  HC:      229.8  mm     G. Age:  25w 0d        1.3  %    HC/AC:      1.10        1.05 - 1.21  AC:      209.5  mm     G. Age:  25w 3d         13  %    FL/BPD:     82.9   %    71 - 87  FL:       48.9  mm     G. Age:  26w 3d         31  %    FL/AC:      23.3   %    20 - 24  LV:        5.8  mm  Est. FW:     843  gm    1 lb 14 oz      12  % ----------------------------------------------------------------------  Gestational Age  LMP:           26w 4d        Date:  03/04/20                 EDD:   12/09/20  U/S Today:     25w 2d                                        EDD:   12/18/20  Best:          26w 4d     Det. By:  LMP  (03/04/20)          EDD:   12/09/20 ---------------------------------------------------------------------- Anatomy  Ventricles:            Appears normal         Diaphragm:              Appears normal  Heart:                 Appears normal         Stomach:                Appears normal  RVOT:                  Remains not well       Kidneys:                Previously seen  visualized  LVOT:                  Remains not well       Bladder:                Appears normal                         visualized  Ductal Arch:           Remains not well                         visualized  Other:  All other anatomy previously seen ---------------------------------------------------------------------- Doppler - Fetal Vessels  Umbilical Artery   S/D     %tile      RI    %tile      PI    %tile     PSV    ADFV                                                     (cm/s)   6.56   > 97.5    0.85   > 97.5    1.71   > 97.5    16.96     Yes ---------------------------------------------------------------------- Impression  Follow up growth due to prior maternal elevated BMI.  Normal interval growth with measurements was small for  dates.  Good fetal movement and decreased amniotic fluid level (AFI  and MVP 3 cm).  NST was attempted but not completed due to fragmented  tracing. We obtained a good 2 min tracing with good  variability and not decelerations.  UA Dopplers were performed and noted to be AEDF today.  I reviewed today's findings with Ms. Valls. I discussed that  given the abnormal UA Dopplers there is an increased risk for  preterm delivery and stillbirth. We discussed the etiology of  today's finding that is likely due to abnormal placentation.  Given the limited NST today, abnormal UA Dopplers and  low  fluid I have asked Ms. Kennerson to go to the MAU for further  monitoring, fluid and betamethasone.  She should obtain her second betamethasone tomorrow.  NST on Friday with AFI in your offices. Initiate twice weekly  testing with weekly UA dopplers , repeat growth in 3 weeks. ---------------------------------------------------------------------- Recommendations  To MAU BMZ, NST and Hydration.  BMZ tomorrow  NST/AFI friday in offices  2x weekly testinng with NST/AFI with UA Dopplers performed  weekly.  Repeat growth in 3 weeks. ----------------------------------------------------------------------                    Sander Nephew, MD Electronically Signed Corrected Final Report  09/06/2020 01:51 pm ----------------------------------------------------------------------    Assessment and Plan: 30yo [id number]@ 11w2dadmitted for cHTN- plan to rule out preeclampsia  Admit to Antenatal 1) Chronic HTN- plan to rule out preeclampsia -BP currently stable, start on ProcardiaXL 317mdaily -labs to be collected -continue ASA daily  2) FWB -Betamethasone recently given last week -Consider Magnesium sulfate for CP prophylaxis if evidence of superimposed preeclampsia or worsening maternal or fetal status -NST daily -Absent EDF noted- continue with AFI/dopplers twice weekly per MFM recommendation -Will recheck presentation if she does progress in preterm labor to determine route of delivery  3) Maternal  care -Anxiety- continue zoloft daily -pepcid twice daily -Routine antenatal care  Janyth Pupa, DO Attending [place], [place] for [facility], [place]

## 2020-09-11 NOTE — Procedures (Signed)
Alyssa Ray 1990/12/03 [redacted]w[redacted]d  Fetus A Non-Stress Test Interpretation for 09/11/20  Indication: IUGR  Fetal Heart Rate A Mode: External Baseline Rate (A): 150 bpm Variability: Moderate Accelerations: 10 x 10 Decelerations: None Multiple birth?: No  Uterine Activity Mode: Palpation,Toco Contraction Frequency (min): None Resting Tone Palpated: Relaxed Resting Time: Adequate  Interpretation (Fetal Testing) Nonstress Test Interpretation: Reactive Overall Impression: Reassuring for gestational age Comments: Dr. Judeth Cornfield reviewed tracing.

## 2020-09-11 NOTE — Consult Note (Signed)
Neonatology Consult Note:  At the request of the patients obstetrician Dr. Nelda Marseille I met with [name] who is a 30 y.o. G2P0010 at 52w2dadmitted for chronic HTN with elevated blood pressure.  [place]ent EDF [place]  Will continue with AFI/dopplers twice weekly with likely move to deliver if flow reverses.    We discussed morbidity/mortality at this gestional age, delivery room resuscitation, including intubation and surfactant in DR.  Discussed mechanical ventilation and risk for chronic lung disease, risk for IVH with potential for motor / cognitive deficits, ROP, NEC, sepsis, as well as temperature instability and feeding immaturity.  Discussed NG / OG feeds, benefits of MBM in reducing incidence of NEC.   Discussed likely length of stay.  Thank you for allowing [place] participate in her care.  Please call with questions.  BHiginio Roger DO  Neonatologist  The total length of face-to-face or floor / unit time for this encounter was 25 minutes.  Counseling and / or coordination of care was greater than fifty percent of the time.

## 2020-09-11 NOTE — Progress Notes (Signed)
Faculty Note  Called by RN to see patient as she is reporting 8/10 pain in lower abdomen, also reporting they cannot keep baby on monitor.   S: Patient feeling 8/10 pain in lower abdomen, on further questioning, pain is bilateral on either hip/groin fold and radiating towards the midline. Was not present on arrival and has worsened throughout the day. Tylenol and heating pad did not help. Feeling nausea and normal fetal movement. Some tightening. Had normal BM today, has been voiding without issue.  O: BP (!) 154/84 (BP Location: Left Arm) Comment: nurse notified  Pulse (!) 55   Temp 99 F (37.2 C) (Oral)   Resp 16   Ht 5\' 4"  (1.626 m)   Wt 136.1 kg   LMP 03/04/2020   SpO2 100%   BMI 51.49 kg/m  Gen: alert, oriented, cooperative but appears uncomfortable Abd: gravid uterus soft, tender in bilateral groin folds rather than pelvis/abdomen Ext: no tenderness on upper extremities near groin folds  Bedside 03/06/2020: fetal movement seen, HR located for RN and helped apply monitor  A/P:  30 yo G2P0010 @ [redacted]w[redacted]d admitted for worsening BP in the setting of chronic hypertension and IUGR with absent end diastolic flow. Started on procardia 30 mg XL today. Cont monitoring as this is the first time she has been on BP meds. Suspect groin pain is musculoskeletal in nature as it started today and is reproducible with exam, no contractions on monitor.   Egg crate for mattress Repositioning prn for pain Tylenol prn Phenergan prn  K. [redacted]w[redacted]d, MD, [facility] Attending Center for [facility])

## 2020-09-11 NOTE — Progress Notes (Signed)
Dr. Judeth Cornfield is sending patient to [facility] for further evaluation and management of HTN. Dr. Judeth Cornfield spoke with attending MD. [facility] notified.

## 2020-09-12 DIAGNOSIS — O1092 Unspecified pre-existing hypertension complicating childbirth: Secondary | ICD-10-CM | POA: Diagnosis not present

## 2020-09-12 DIAGNOSIS — Z8616 Personal history of COVID-19: Secondary | ICD-10-CM | POA: Diagnosis not present

## 2020-09-12 DIAGNOSIS — O321XX0 Maternal care for breech presentation, not applicable or unspecified: Secondary | ICD-10-CM | POA: Diagnosis present

## 2020-09-12 DIAGNOSIS — O99214 Obesity complicating childbirth: Secondary | ICD-10-CM | POA: Diagnosis present

## 2020-09-12 DIAGNOSIS — O365920 Maternal care for other known or suspected poor fetal growth, second trimester, not applicable or unspecified: Secondary | ICD-10-CM | POA: Diagnosis present

## 2020-09-12 DIAGNOSIS — Z87891 Personal history of nicotine dependence: Secondary | ICD-10-CM | POA: Diagnosis not present

## 2020-09-12 DIAGNOSIS — O99344 Other mental disorders complicating childbirth: Secondary | ICD-10-CM | POA: Diagnosis present

## 2020-09-12 DIAGNOSIS — O4103X0 Oligohydramnios, third trimester, not applicable or unspecified: Secondary | ICD-10-CM | POA: Diagnosis not present

## 2020-09-12 DIAGNOSIS — Z7982 Long term (current) use of aspirin: Secondary | ICD-10-CM | POA: Diagnosis not present

## 2020-09-12 DIAGNOSIS — F419 Anxiety disorder, unspecified: Secondary | ICD-10-CM | POA: Diagnosis present

## 2020-09-12 DIAGNOSIS — O10012 Pre-existing essential hypertension complicating pregnancy, second trimester: Secondary | ICD-10-CM | POA: Diagnosis not present

## 2020-09-12 DIAGNOSIS — O368330 Maternal care for abnormalities of the fetal heart rate or rhythm, third trimester, not applicable or unspecified: Secondary | ICD-10-CM | POA: Diagnosis not present

## 2020-09-12 DIAGNOSIS — Z23 Encounter for immunization: Secondary | ICD-10-CM | POA: Diagnosis not present

## 2020-09-12 DIAGNOSIS — IMO0002 Reserved for concepts with insufficient information to code with codable children: Secondary | ICD-10-CM | POA: Diagnosis present

## 2020-09-12 DIAGNOSIS — O1002 Pre-existing essential hypertension complicating childbirth: Secondary | ICD-10-CM | POA: Diagnosis present

## 2020-09-12 DIAGNOSIS — O99213 Obesity complicating pregnancy, third trimester: Secondary | ICD-10-CM | POA: Diagnosis not present

## 2020-09-12 DIAGNOSIS — O10913 Unspecified pre-existing hypertension complicating pregnancy, third trimester: Secondary | ICD-10-CM | POA: Diagnosis not present

## 2020-09-12 DIAGNOSIS — O4102X0 Oligohydramnios, second trimester, not applicable or unspecified: Secondary | ICD-10-CM | POA: Diagnosis not present

## 2020-09-12 DIAGNOSIS — O99830 Other infection carrier state complicating pregnancy: Secondary | ICD-10-CM | POA: Diagnosis not present

## 2020-09-12 DIAGNOSIS — O10013 Pre-existing essential hypertension complicating pregnancy, third trimester: Secondary | ICD-10-CM | POA: Diagnosis not present

## 2020-09-12 DIAGNOSIS — Z3A27 27 weeks gestation of pregnancy: Secondary | ICD-10-CM | POA: Diagnosis not present

## 2020-09-12 DIAGNOSIS — O365930 Maternal care for other known or suspected poor fetal growth, third trimester, not applicable or unspecified: Secondary | ICD-10-CM

## 2020-09-12 DIAGNOSIS — F32A Depression, unspecified: Secondary | ICD-10-CM | POA: Diagnosis present

## 2020-09-12 DIAGNOSIS — Z3A28 28 weeks gestation of pregnancy: Secondary | ICD-10-CM | POA: Diagnosis not present

## 2020-09-12 HISTORY — DX: Reserved for concepts with insufficient information to code with codable children: IMO0002

## 2020-09-12 LAB — CULTURE, OB URINE: Culture: NO GROWTH

## 2020-09-12 MED ORDER — HYDROXYZINE HCL 50 MG PO TABS
25.0000 mg | ORAL_TABLET | Freq: Three times a day (TID) | ORAL | Status: DC | PRN
  Administered 2020-09-12 – 2020-09-14 (×2): 25 mg via ORAL
  Filled 2020-09-12 (×2): qty 1

## 2020-09-12 MED ORDER — SERTRALINE HCL 50 MG PO TABS
100.0000 mg | ORAL_TABLET | Freq: Every day | ORAL | Status: DC
  Administered 2020-09-12 – 2020-09-17 (×6): 100 mg via ORAL
  Filled 2020-09-12 (×6): qty 2

## 2020-09-12 MED ORDER — SODIUM CHLORIDE 0.9 % IV SOLN: Freq: Once | INTRAVENOUS | Status: AC

## 2020-09-12 NOTE — Progress Notes (Signed)
FACULTY PRACTICE ANTEPARTUM(COMPREHENSIVE) NOTE  Alyssa Ray is a 30 y.o. G2P0010 with Estimated Date of Delivery: 12/09/20   By  LMP [redacted]w[redacted]d  who is admitted for cHTN and poor fetal growth with AED.    Fetal presentation is transverse. Length of Stay:  1  Days  Date of admission:09/11/2020  Subjective: Resting comfortably in bed.  She states that her anxiety has been an issue.  She was supposed to see her psychiatrist yesterday, but since she was here she did not keep this appointment. Patient reports the fetal movement as active. Patient reports uterine contraction  activity as none. Patient reports  vaginal bleeding as none. Patient describes fluid per vagina as None.  Vitals:  Blood pressure (!) 147/91, pulse (!) 57, temperature 98.1 F (36.7 C), temperature source Oral, resp. rate 18, height 5\' 4"  (1.626 m), weight 136.1 kg, last menstrual period 03/04/2020, SpO2 99 %. Vitals:   09/11/20 2010 09/11/20 2354 09/12/20 0432 09/12/20 0734  BP:  132/62 (!) 148/77 (!) 147/91  Pulse:  (!) 56 (!) 55 (!) 57  Resp:  18 16 18   Temp:  98.4 F (36.9 C) 98.6 F (37 C) 98.1 F (36.7 C)  TempSrc:  Oral Oral Oral  SpO2: 98% 98% 99% 99%  Weight:      Height:       Physical Examination:  General appearance - alert, well appearing, and in no distress Chest - normal respiratory effort, CTAB Heart - normal rate and regular rhythm Abdomen - obese, soft and non-tender Extremities - no pedal edema noted Skin - warm and dry  Fetal Monitoring:  Baseline: 140 bpm, Variability: moderate, Accelerations: +10x10 accels, and Decelerations: occasional variable noted    Reactive for current gestational age  Labs:  Results for orders placed or performed during the hospital encounter of 09/11/20 (from the past 24 hour(s))  Protein / creatinine ratio, urine   Collection Time: 09/11/20 11:00 AM  Result Value Ref Range   Creatinine, Urine 69.88 mg/dL   Total Protein, Urine 14 mg/dL   Protein Creatinine  Ratio 0.20 (H) 0.00 - 0.15 mg/mg[Cre]  Urinalysis, Routine w reflex microscopic OB Clean Catch   Collection Time: 09/11/20  4:23 PM  Result Value Ref Range   Color, Urine STRAW (A) YELLOW   APPearance CLEAR CLEAR   Specific Gravity, Urine 1.011 1.005 - 1.030   pH 6.0 5.0 - 8.0   Glucose, UA NEGATIVE NEGATIVE mg/dL   Hgb urine dipstick NEGATIVE NEGATIVE   Bilirubin Urine NEGATIVE NEGATIVE   Ketones, ur NEGATIVE NEGATIVE mg/dL   Protein, ur NEGATIVE NEGATIVE mg/dL   Nitrite NEGATIVE NEGATIVE   Leukocytes,Ua NEGATIVE NEGATIVE    Medications:  Scheduled . aspirin EC  81 mg Oral Daily  . docusate sodium  100 mg Oral Daily  . famotidine  20 mg Oral BID  . NIFEdipine  30 mg Oral Daily  . prenatal multivitamin  1 tablet Oral Q1200  . sertraline  100 mg Oral Daily   I have reviewed the patient's current medications.  ASSESSMENT: G2P0010 [redacted]w[redacted]d Estimated Date of Delivery: 12/09/20  Patient Active Problem List   Diagnosis Date Noted  . Chronic hypertension affecting pregnancy 09/11/2020  . Amniotic fluid index borderline low 09/08/2020  . Placental abnormality, antepartum 09/08/2020  . De Quervain's tenosynovitis, right 08/22/2020  . Chronic hypertension in pregnancy 05/29/2020  . Obesity in pregnancy 05/15/2020  . Depression   . Anxiety   . ADHD (attention deficit hyperactivity disorder)     PLAN:  1) chronic HTN- no evidence of preeclampsia -pt asymptomatic -currently BP well controlled with ProcardiaXL 30mg  daily -continue ASA daily -plan to repeat lab work as clinically indicated  2) FWB- poor fetal growth with AED -AFI/dopplers twice weekly per MFM -s/p betamethasone -currently on continuous monitoring, if fetal well being remains reassuring by monitoring and next , may consider modifying -Transverse presentation- pt aware that route of delivery would be C-section  3) Maternal care -anxiety- increase zoloft to 100mg  daily -Pepcid twice daily  DISP: Continue with  antenatal care as outlined above.  Plan to keep for in-house monitoring until delivery due to concern for fetal well-being  [place], DO Attending Obstetrician & Gynecologist, [facility] for [place], [facility]

## 2020-09-13 ENCOUNTER — Encounter (HOSPITAL_COMMUNITY): Payer: Self-pay | Admitting: Obstetrics & Gynecology

## 2020-09-13 LAB — COMPREHENSIVE METABOLIC PANEL
ALT: 14 U/L (ref 0–44)
AST: 15 U/L (ref 15–41)
Albumin: 2.5 g/dL — ABNORMAL LOW (ref 3.5–5.0)
Alkaline Phosphatase: 483 U/L — ABNORMAL HIGH (ref 38–126)
Anion gap: 8 (ref 5–15)
BUN: 8 mg/dL (ref 6–20)
CO2: 20 mmol/L — ABNORMAL LOW (ref 22–32)
Calcium: 8.4 mg/dL — ABNORMAL LOW (ref 8.9–10.3)
Chloride: 108 mmol/L (ref 98–111)
Creatinine, Ser: 0.5 mg/dL (ref 0.44–1.00)
GFR, Estimated: >60 mL/min (ref >60)
Glucose, Bld: 75 mg/dL (ref 70–99)
Potassium: 4 mmol/L (ref 3.5–5.1)
Sodium: 136 mmol/L (ref 135–145)
Total Bilirubin: 0.6 mg/dL (ref 0.3–1.2)
Total Protein: 5.7 g/dL — ABNORMAL LOW (ref 6.5–8.1)

## 2020-09-13 LAB — CBC
HCT: 39.6 % (ref 36.0–46.0)
Hemoglobin: 12.7 g/dL (ref 12.0–15.0)
MCH: 26.7 pg (ref 26.0–34.0)
MCHC: 32.1 g/dL (ref 30.0–36.0)
MCV: 83.2 fL (ref 80.0–100.0)
Platelets: 214 10*3/uL (ref 150–400)
RBC: 4.76 MIL/uL (ref 3.87–5.11)
RDW: 15.8 % — ABNORMAL HIGH (ref 11.5–15.5)
WBC: 11.5 10*3/uL — ABNORMAL HIGH (ref 4.0–10.5)
nRBC: 0 % (ref 0.0–0.2)

## 2020-09-13 MED ORDER — LACTATED RINGERS IV SOLN: INTRAVENOUS | Status: DC

## 2020-09-13 NOTE — Progress Notes (Signed)
FACULTY PRACTICE ANTEPARTUM(COMPREHENSIVE) NOTE  Alyssa Ray is a 30 y.o. G2P0010 with Estimated Date of Delivery: 12/09/20   By  best clinical estimate [redacted]w[redacted]d  who is admitted for cHTN with fetal growth restriction with AEDF.    Fetal presentation is transverse. Length of Stay:  2  Days  Date of admission:09/11/2020  Subjective: Overnight, pt was noting to have variable decels and was kept NPO.  This am, she reports being very hungry and is hopeful that we are not delivering today.  Other than hunger, reports no acute complaints. Patient reports the fetal movement as active. Patient reports uterine contraction  activity as none. Patient reports  vaginal bleeding as none. Patient describes fluid per vagina as None.  Vitals:  Blood pressure (!) 143/82, pulse (!) 54, temperature 97.8 F (36.6 C), temperature source Oral, resp. rate 18, height 5\' 4"  (1.626 m), weight 136.1 kg, last menstrual period 03/04/2020, SpO2 98 %. Vitals:   09/12/20 2019 09/13/20 0018 09/13/20 0532 09/13/20 0827  BP: (!) 151/73 127/63 (!) 152/78 (!) 143/82  Pulse: 65 (!) 56 (!) 54 (!) 54  Resp: 18 18 18 18   Temp: 98.7 F (37.1 C) 98.3 F (36.8 C) 97.8 F (36.6 C) 97.8 F (36.6 C)  TempSrc: Oral Oral Oral Oral  SpO2: 98% 99% 99% 98%  Weight:      Height:       Physical Examination:  General appearance - alert, well appearing, and in no distress Mental status - anxious Chest - CTAB Heart - normal rate and regular rhythm Abdomen - gravid, obese, soft and non-tender Extremities - no pedal edema noted, no calf tenderness bilaterally Skin - warm and dry  Fetal Monitoring:  Baseline: 150 bpm, Variability: moderate, Accelerations: +10x10 accels, and Decelerations: decel noted to 120bpm x 90sec with spontaneous recovery.  FHT reviewed typically 1-2 variable decels noted per hour- not recurrent reactive  Labs:  Results for orders placed or performed during the hospital encounter of 09/11/20 (from the past 24  hour(s))  CBC   Collection Time: 09/13/20  7:12 AM  Result Value Ref Range   WBC 11.5 (H) 4.0 - 10.5 K/uL   RBC 4.76 3.87 - 5.11 MIL/uL   Hemoglobin 12.7 12.0 - 15.0 g/dL   HCT 09/13/20 09/15/20 - 40.9 %   MCV 83.2 80.0 - 100.0 fL   MCH 26.7 26.0 - 34.0 pg   MCHC 32.1 30.0 - 36.0 g/dL   RDW 81.1 (H) 91.4 - 78.2 %   Platelets 214 150 - 400 K/uL   nRBC 0.0 0.0 - 0.2 %  Comprehensive metabolic panel   Collection Time: 09/13/20  7:12 AM  Result Value Ref Range   Sodium 136 135 - 145 mmol/L   Potassium 4.0 3.5 - 5.1 mmol/L   Chloride 108 98 - 111 mmol/L   CO2 20 (L) 22 - 32 mmol/L   Glucose, Bld 75 70 - 99 mg/dL   BUN 8 6 - 20 mg/dL   Creatinine, Ser 21.3 0.44 - 1.00 mg/dL   Calcium 8.4 (L) 8.9 - 10.3 mg/dL   Total Protein 5.7 (L) 6.5 - 8.1 g/dL   Albumin 2.5 (L) 3.5 - 5.0 g/dL   AST 15 15 - 41 U/L   ALT 14 0 - 44 U/L   Alkaline Phosphatase 483 (H) 38 - 126 U/L   Total Bilirubin 0.6 0.3 - 1.2 mg/dL   GFR, Estimated 09/15/20 0.86 mL/min   Anion gap 8 5 - 15  Medications:  Scheduled . aspirin EC  81 mg Oral Daily  . docusate sodium  100 mg Oral Daily  . famotidine  20 mg Oral BID  . NIFEdipine  30 mg Oral Daily  . prenatal multivitamin  1 tablet Oral Q1200  . sertraline  100 mg Oral Daily   I have reviewed the patient's current medications.  ASSESSMENT: G2P0010 [redacted]w[redacted]d Estimated Date of Delivery: 12/09/20  Patient Active Problem List   Diagnosis Date Noted  . Poor fetal growth 09/12/2020  . Chronic hypertension affecting pregnancy 09/11/2020  . Amniotic fluid index borderline low 09/08/2020  . Placental abnormality, antepartum 09/08/2020  . De Quervain's tenosynovitis, right 08/22/2020  . COVID-19 affecting pregnancy in second trimester 06/21/2020  . Obesity in pregnancy 05/15/2020  . Depression   . Anxiety   . ADHD (attention deficit hyperactivity disorder)    PLAN: 1) chronic HTN- no evidence of preeclampsia -pt asymptomatic -currently BP well controlled with ProcardiaXL  30mg  daily -continue ASA daily -lab work repeat on 3/30 remain stable, plan to continue as clinically indicated and/or weekly  2) FWB- poor fetal growth with AEDF -AFI/dopplers twice weekly per MFM -s/p betamethasone -Transverse presentation- pt aware that route of delivery would be C-section  3) Maternal care -anxiety- continue zoloft to 100mg  daily -Pepcid twice daily -ok for regular diet this am  DISPO: Tracing reviewed this am.  Discussed with patient that occasional variable decels are expected in the current gestational age with fetal growth restriction and AEDF.  Reviewed with patient that there is not a perfect predictor of when to proceed with delivery vs expectant management.  Discussed with patient that overall FHT reassuring as accels are noted and decels are not recurrent.  Will continue with close observation though based on difficulty with continued tracing may slowly back off continuous monitoring if tracing remains the same.  Care plan also reviewed with MFM  4/30 09/13/2020,11:12 AM

## 2020-09-13 NOTE — Plan of Care (Signed)
  Problem: Education: Goal: Knowledge of General Education information will improve Description: Including pain rating scale, medication(s)/side effects and non-pharmacologic comfort measures Outcome: Progressing   Problem: Clinical Measurements: Goal: Ability to maintain clinical measurements within normal limits will improve Outcome: Progressing   Problem: Education: Goal: Knowledge of disease or condition will improve Outcome: Progressing Goal: Knowledge of the prescribed therapeutic regimen will improve Outcome: Progressing   Problem: Clinical Measurements: Goal: Complications related to the disease process, condition or treatment will be avoided or minimized Outcome: Progressing   Problem: Clinical Measurements: Goal: Complications related to disease process, condition or treatment will be avoided or minimized Outcome: Progressing

## 2020-09-14 ENCOUNTER — Other Ambulatory Visit: Payer: Medicaid Other

## 2020-09-14 ENCOUNTER — Inpatient Hospital Stay (HOSPITAL_BASED_OUTPATIENT_CLINIC_OR_DEPARTMENT_OTHER): Payer: Medicaid Other

## 2020-09-14 DIAGNOSIS — O4102X0 Oligohydramnios, second trimester, not applicable or unspecified: Secondary | ICD-10-CM

## 2020-09-14 DIAGNOSIS — Z8616 Personal history of COVID-19: Secondary | ICD-10-CM

## 2020-09-14 DIAGNOSIS — O99212 Obesity complicating pregnancy, second trimester: Secondary | ICD-10-CM

## 2020-09-14 DIAGNOSIS — O365920 Maternal care for other known or suspected poor fetal growth, second trimester, not applicable or unspecified: Secondary | ICD-10-CM | POA: Diagnosis not present

## 2020-09-14 DIAGNOSIS — Z3A27 27 weeks gestation of pregnancy: Secondary | ICD-10-CM

## 2020-09-14 DIAGNOSIS — O10012 Pre-existing essential hypertension complicating pregnancy, second trimester: Secondary | ICD-10-CM

## 2020-09-14 DIAGNOSIS — E669 Obesity, unspecified: Secondary | ICD-10-CM

## 2020-09-14 DIAGNOSIS — O99830 Other infection carrier state complicating pregnancy: Secondary | ICD-10-CM

## 2020-09-14 DIAGNOSIS — Z87798 Personal history of other (corrected) congenital malformations: Secondary | ICD-10-CM

## 2020-09-14 DIAGNOSIS — O321XX0 Maternal care for breech presentation, not applicable or unspecified: Secondary | ICD-10-CM

## 2020-09-14 DIAGNOSIS — O99342 Other mental disorders complicating pregnancy, second trimester: Secondary | ICD-10-CM

## 2020-09-14 MED ORDER — DIPHENHYDRAMINE HCL 25 MG PO CAPS
50.0000 mg | ORAL_CAPSULE | Freq: Four times a day (QID) | ORAL | Status: DC | PRN
  Administered 2020-09-14 – 2020-09-16 (×2): 50 mg via ORAL
  Filled 2020-09-14 (×2): qty 2

## 2020-09-14 NOTE — Progress Notes (Signed)
FACULTY PRACTICE ANTEPARTUM(COMPREHENSIVE) NOTE  Alyssa Ray is a 30 y.o. G2P0010 with Estimated Date of Delivery: 12/09/20   By  best clinical estimate [redacted]w[redacted]d  who is admitted for fetal growth restriction with AEDF in the setting of cHTN.  Fetal presentation is breech. Length of Stay:  3  Days  Date of admission:09/11/2020  Subjective: Pt notes a difficult night- due to monitoring, she had difficulty sleeping.  Notes mild headache this am- awaiting tylenol.  Denies blurry vision or RUQ pain.  Pt  Patient reports the fetal movement as active. Patient reports uterine contraction  activity as none. Patient reports  vaginal bleeding as none. Patient describes fluid per vagina as None.  Vitals:  Blood pressure (!) 146/90, pulse 62, temperature 98 F (36.7 C), temperature source Oral, resp. rate 18, height 5\' 4"  (1.626 m), weight 136.1 kg, last menstrual period 03/04/2020, SpO2 100 %. Vitals:   09/13/20 2004 09/13/20 2338 09/14/20 0404 09/14/20 0749  BP: (!) 148/75 139/72 (!) 142/79 (!) 146/90  Pulse: 72 64 (!) 57 62  Resp: 18 16 18 18   Temp: 97.8 F (36.6 C) 98.3 F (36.8 C) 98.3 F (36.8 C) 98 F (36.7 C)  TempSrc: Oral Oral Oral Oral  SpO2: 97% 98% 97% 100%  Weight:      Height:       Physical Examination:  General appearance - alert, well appearing, and in no distress Chest - clear to auscultation, no wheezes, rales or rhonchi Heart - normal rate, regular rhythm Abdomen - obese, gravid, soft and non-tender Extremities - no pedal edema noted, no calf tenderness bilaterally Skin - warm and dry   Fetal Monitoring:  Baseline: 140 bpm, Variability: moderate, Accelerations: +10x10, and Decelerations: occasional either variable decel or prolonged decel for ~40min with spontaneous recovery  Reactive NST for current gestational age  Labs:  No results found for this or any previous visit (from the past 24 hour(s)).  Imaging Studies:    MFM OB LIMITED  Result Date:  09/14/2020 ----------------------------------------------------------------------  OBSTETRICS REPORT                       (Signed Final 09/14/2020 10:31 am) ---------------------------------------------------------------------- Patient Info  ID #:       09/16/2020                          D.O.B.:  09-02-1990 (29 yrs)  Name:       [id number] Alyssa Ray                   Visit Date: 09/14/2020 08:59 am ---------------------------------------------------------------------- Performed By  Attending:        Tommy Rainwater MD        Ref. Address:     [address]                                                             [place], [address]  [address]  Performed By:     Percell Boston          Secondary Phy.:   NICOLE E                    RDMS                                                             NUGENT  Referred By:      [facility]          [facility] ---------------------------------------------------------------------- Orders  #  Description                           Code        Ordered By  1  [place] MFM UA CORD DOPPLER                76820.02    Myna Hidalgo  2  [place] MFM OB LIMITED                     60454.09    Myna Hidalgo ----------------------------------------------------------------------  #  Order #                     Accession #                Episode #  1  [id number]                   [id number]                 [id number]  2  [id number]                   [id number]                 [id number] ---------------------------------------------------------------------- Indications  Hypertension - Chronic/Pre-existing            O10.019  Maternal care for known or suspected poor      O36.5920  fetal growth, second trimester, not applicable  or unspecified IUGR  Oligohydramnios / Decreased amniotic fluid     O41.00X0  volume  Medical complication of pregnancy (Covid 19    O26.90  +  January 2022)  LR NIPS  Obesity complicating pregnancy, second         O99.212  trimester (BMI 49)  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 Cord Insertion:      Previously Visualized  Amniotic Fluid  AFI FV:      Subjectively decreased                              Largest Pocket(cm)                              2.6 ---------------------------------------------------------------------- Gestational Age  LMP:           27w 5d        Date:  03/04/20                 EDD:   12/09/20  Best:          27w 5d     Det. By:  LMP  (03/04/20)          EDD:   12/09/20 ---------------------------------------------------------------------- Anatomy  Thoracic:              Appears normal         Bladder:                Appears normal  Stomach:               Appears normal, left                         sided ---------------------------------------------------------------------- Doppler - Fetal Vessels  Umbilical Artery   S/D     %tile      RI    %tile      PI    %tile            ADFV    RDFV   6.69   > 97.5    0.85   > 97.5    1.71   > 97.5              Yes      No ---------------------------------------------------------------------- Cervix Uterus Adnexa  Cervix  Not visualized (advanced GA >24wks)  Uterus  No abnormality visualized.  Right Ovary  No adnexal mass visualized.  Left Ovary  No adnexal mass visualized.  Cul De Sac  No free fluid seen.  Adnexa  No abnormality visualized. ---------------------------------------------------------------------- Impression  G2 P0. GA 27w 5d.  Patient was admitted on 09/11/20 with  diagnosis of chronic  hypertension and severe fetal growth restriction. Absent end-  diastolic flow was seen on umbilical artery Doppler study.  Since admission, her blood pressures have been in the mild  hypertensive range. She takes nifedipine XL 30 mg daily.  Labs including liver enzymes, creatinine and platelets are  within normal range. Protein/creatinine ratio is normal.  NST showed some variable decelerations. Good variability is  seen.  On today's ultrasound, amniotic fluid is decreased (deepest  vertical pocket 3 cm) and good fetal activity is seen. Umbilical  artery Doppler showed intermittent absent end-diastolic flow.  Breech presentation.  Patient has early-onset severe fetal growth restriction  possibly from chronic hypertension leading to placental  insufficiency.  I feel it is appropriate to continue inpatient management with  daily NST and twice-weekly Doppler studies. A decision for  outpatient management can be made if fetal status (NST  and  no worsening of Doppler studies) is reassuring.  Delivery decision should be based on non-reassuring NST  diagnosed by on-call obstetrician. ---------------------------------------------------------------------- Recommendations  -Intermittent NST 2 or 3 times daily and decrease to once  daily when reassuring.  -Twice-weekly UA Doppler to continue.  -Delivery decision on non-reassuring NST. ----------------------------------------------------------------------                  Noralee Space, MD Electronically Signed Final Report   09/14/2020 10:31 am ----------------------------------------------------------------------  [place] MFM OB LIMITED  Result Date: 09/11/2020 ----------------------------------------------------------------------  OBSTETRICS REPORT                       (Signed Final 09/11/2020 09:14 am) ---------------------------------------------------------------------- Patient Info  ID #:       [id number]                          D.O.B.:  [birth year] (29  yrs)  Name:       Tommy Rainwater Paulus                   Visit Date: 09/11/2020 07:34 am ---------------------------------------------------------------------- Performed By  Attending:        Noralee Space MD        Ref. Address:     [address]                                                             [place], [place]                                                             [address]  Performed By:     Emeline Darling BS,      Secondary Phy.:   NICOLE E                    RDMS                                                             NUGENT  Referred By:      [facility]          [facility] ---------------------------------------------------------------------- Orders  #  Description                           Code        Ordered By  1  [place] MFM OB LIMITED                     [id number]    Lin Landsman  2  [place] MFM UA CORD DOPPLER                [id number]    Lin Landsman ----------------------------------------------------------------------  #  Order #                     Accession #                Episode #  1  [id number]                   [id number]                 [id number]  2  [id number]                   [id number]                 [id number] ---------------------------------------------------------------------- Indications  Hypertension - Chronic/Pre-existing            O10.019  Maternal care for known or suspected poor      O36.5920  fetal growth, second trimester, not applicable  or unspecified IUGR  Oligohydramnios / Decreased amniotic fluid     O41.00X0  volume  Medical complication of pregnancy (Covid 19    O26.90  + January 2022)  LR NIPS  Obesity complicating  pregnancy, second         O99.212  trimester (BMI 49)  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 Cord Insertion:      Previously Visualized  Amniotic Fluid  AFI FV:      Subjectively decreased                              Largest Pocket(cm)                              3.3 ---------------------------------------------------------------------- Gestational Age  LMP:           27w 2d        Date:  03/04/20                 EDD:   12/09/20  Best:          27w 2d     Det. By:  LMP  (03/04/20)          EDD:   12/09/20 ---------------------------------------------------------------------- Anatomy  Stomach:               Appears normal, left   Bladder:                Appears normal                         sided ---------------------------------------------------------------------- Doppler - Fetal Vessels  Umbilical Artery   S/D     %tile      RI    %tile                             ADFV    RDFV   6.47   > 97.5    0.85   > 97.5                               Yes      No ---------------------------------------------------------------------- Impression  Patient with chronic hypertension fetal growth restriction  return for antenatal testing.  She does not take  antihypertensives, and her blood pressures have been in mild  hypertensive range.  She does not have symptoms and signs  of severe features of preeclampsia.  On ultrasound performed on 09/06/2020, the estimated fetal  weight was at the 12 percentile and the abdominal  circumference measurement at the 13th percentile.  However, umbilical artery Doppler showed  absent end-  diastolic flow.  On today's ultrasound, amniotic fluid is subjectively  decreased and the maximum vertical pocket measured 3 cm.  Breech presentation.  Umbilical artery Doppler showed  intermittent absent end-diastolic flow (mostly absent).  NST is  reactive for this gestational age (difficult to monitor because  of maternal obesity).  Initial blood pressure at her office was 154/94 mmHg and  repeat blood pressures were 177/95 and 176/99 mmHg.  I counseled her on severe hypertension and the possibility of  superimposed preeclampsia.  I have recommended  admission and inpatient management that also affords  opportunity for daily NST monitoring.  Patient is aware that prolonged hospitalization may be  advised.  Discussed with Ob-Attending (Dr. Charlotta Newtonzan). ---------------------------------------------------------------------- Recommendations  -Inpatient management.  -R/o superimposed preeclampsia (labs and series of blood  pressures).  -Daily NST.  -Continue twice-weekly UA Doppler studies. ----------------------------------------------------------------------                  Noralee Spaceavi Shankar, MD Electronically Signed Final  Report   09/11/2020 09:14 am ----------------------------------------------------------------------  [place] MFM UA CORD DOPPLER  Result Date: 09/14/2020 ----------------------------------------------------------------------  OBSTETRICS REPORT                       (Signed Final 09/14/2020 10:31 am) ---------------------------------------------------------------------- Patient Info  ID #:       [id number]                          D.O.B.:  [birth year] (29 yrs)  Name:       Tommy Rainwater Gabbert                   Visit Date: 09/14/2020 08:59 am ---------------------------------------------------------------------- Performed By  Attending:        Noralee Space MD        Ref. Address:     [address]                                                             [place], [place]                                                              [address]  Performed By:     Percell Boston          Secondary Phy.:   NICOLE E                    RDMS                                                             NUGENT  Referred By:      [facility]          [facility] ---------------------------------------------------------------------- Orders  #  Description                           Code        Ordered By  1  [place] MFM UA CORD DOPPLER                76820.02    Myna Hidalgo  2  [place] MFM OB LIMITED                     19147.82    Myna Hidalgo ----------------------------------------------------------------------  #  Order #                     Accession #  Episode #  1  [id number]                   [id number]                 [id number]  2  [id number]                   [id number]                 [id number] ---------------------------------------------------------------------- Indications  Hypertension - Chronic/Pre-existing            O10.019  Maternal care for known or suspected poor      O36.5920  fetal growth, second trimester, not applicable  or unspecified IUGR  Oligohydramnios / Decreased amniotic fluid     O41.00X0  volume  Medical complication of pregnancy (Covid 19    O26.90  + January 2022)  LR NIPS  Obesity complicating pregnancy, second         O99.212  trimester (BMI 49)  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 Cord Insertion:      Previously Visualized  Amniotic Fluid  AFI FV:      Subjectively decreased                               Largest Pocket(cm)                              2.6 ---------------------------------------------------------------------- Gestational Age  LMP:           27w 5d        Date:  03/04/20                 EDD:   12/09/20  Best:          27w 5d     Det. By:  LMP  (03/04/20)          EDD:   12/09/20 ---------------------------------------------------------------------- Anatomy  Thoracic:              Appears normal         Bladder:                Appears normal  Stomach:               Appears normal, left                         sided ---------------------------------------------------------------------- Doppler - Fetal Vessels  Umbilical Artery   S/D     %tile      RI    %tile      PI    %tile            ADFV  RDFV   6.69   > 97.5    0.85   > 97.5    1.71   > 97.5              Yes      No ---------------------------------------------------------------------- Cervix Uterus Adnexa  Cervix  Not visualized (advanced GA >24wks)  Uterus  No abnormality visualized.  Right Ovary  No adnexal mass visualized.  Left Ovary  No adnexal mass visualized.  Cul De Sac  No free fluid seen.  Adnexa  No abnormality visualized. ---------------------------------------------------------------------- Impression  G2 P0. GA 27w 5d.  Patient was admitted on 09/11/20 with diagnosis of chronic  hypertension and severe fetal growth restriction. Absent end-  diastolic flow was seen on umbilical artery Doppler study.  Since admission, her blood pressures have been in the mild  hypertensive range. She takes nifedipine XL 30 mg daily.  Labs including liver enzymes, creatinine and platelets are  within normal range. Protein/creatinine ratio is normal.  NST showed some variable decelerations. Good variability is  seen.  On today's ultrasound, amniotic fluid is decreased (deepest  vertical pocket 3 cm) and good fetal activity is seen. Umbilical  artery Doppler showed intermittent absent end-diastolic flow.  Breech presentation.  Patient  has early-onset severe fetal growth restriction  possibly from chronic hypertension leading to placental  insufficiency.  I feel it is appropriate to continue inpatient management with  daily NST and twice-weekly Doppler studies. A decision for  outpatient management can be made if fetal status (NST and  no worsening of Doppler studies) is reassuring.  Delivery decision should be based on non-reassuring NST  diagnosed by on-call obstetrician. ---------------------------------------------------------------------- Recommendations  -Intermittent NST 2 or 3 times daily and decrease to once  daily when reassuring.  -Twice-weekly UA Doppler to continue.  -Delivery decision on non-reassuring NST. ----------------------------------------------------------------------                  Noralee Space, MD Electronically Signed Final Report   09/14/2020 10:31 am ----------------------------------------------------------------------  [place] MFM UA CORD DOPPLER  Result Date: 09/11/2020 ----------------------------------------------------------------------  OBSTETRICS REPORT                       (Signed Final 09/11/2020 09:14 am) ---------------------------------------------------------------------- Patient Info  ID #:       [id number]                          D.O.B.:  08/23/1990 (29 yrs)  Name:       Tommy Rainwater Vannest                   Visit Date: 09/11/2020 07:34 am ---------------------------------------------------------------------- Performed By  Attending:        Noralee Space MD        Ref. Address:     [address]                                                             [place], [place]  [address]  Performed By:     Emeline Darling BS,      Secondary Phy.:   NICOLE E                    RDMS                                                             NUGENT  Referred By:      [facility]          [facility]         [facility]                                 [facility] at                                                             [facility]                                                             [facility] ---------------------------------------------------------------------- Orders  #  Description                           Code        Ordered By  1  [place] MFM OB LIMITED                     76815.01    Lin Landsman  2  [place] MFM UA CORD DOPPLER                60454.09    Lin Landsman ----------------------------------------------------------------------  #  Order #                     Accession #                Episode #  1  [id number]                   [id number]  [id number]  2  [id number]                   [id number]                 [id number] ---------------------------------------------------------------------- Indications  Hypertension - Chronic/Pre-existing            O10.019  Maternal care for known or suspected poor      O36.5920  fetal growth, second trimester, not applicable  or unspecified IUGR  Oligohydramnios / Decreased amniotic fluid     O41.00X0  volume  Medical complication of pregnancy (Covid 19    O26.90  + January 2022)  LR NIPS  Obesity complicating pregnancy, second         O99.212  trimester (BMI 49)  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 Cord Insertion:      Previously Visualized  Amniotic Fluid  AFI FV:      Subjectively decreased                              Largest Pocket(cm)                               3.3 ---------------------------------------------------------------------- Gestational Age  LMP:           27w 2d        Date:  03/04/20                 EDD:   12/09/20  Best:          27w 2d     Det. By:  LMP  (03/04/20)          EDD:   12/09/20 ---------------------------------------------------------------------- Anatomy  Stomach:               Appears normal, left   Bladder:                Appears normal                         sided ---------------------------------------------------------------------- Doppler - Fetal Vessels  Umbilical Artery   S/D     %tile      RI    %tile                             ADFV    RDFV   6.47   > 97.5    0.85   > 97.5                               Yes      No ----------------------------------------------------------------------  Impression  Patient with chronic hypertension fetal growth restriction  return for antenatal testing.  She does not take  antihypertensives, and her blood pressures have been in mild  hypertensive range.  She does not have symptoms and signs  of severe features of preeclampsia.  On ultrasound performed on 09/06/2020, the estimated fetal  weight was at the 12 percentile and the abdominal  circumference measurement at the 13th percentile.  However, umbilical artery Doppler showed absent end-  diastolic flow.  On today's ultrasound, amniotic fluid is subjectively  decreased and the maximum vertical pocket measured 3 cm.  Breech presentation.  Umbilical artery Doppler showed  intermittent absent end-diastolic flow (mostly absent).  NST is  reactive for this gestational age (difficult to monitor because  of maternal obesity).  Initial blood pressure at her office was 154/94 mmHg and  repeat blood pressures were 177/95 and 176/99 mmHg.  I counseled her on severe hypertension and the possibility of  superimposed preeclampsia.  I have recommended  admission and inpatient management that also affords  opportunity for daily NST monitoring.   Patient is aware that prolonged hospitalization may be  advised.  Discussed with Ob-Attending (Dr. Charlotta Newton). ---------------------------------------------------------------------- Recommendations  -Inpatient management.  -R/o superimposed preeclampsia (labs and series of blood  pressures).  -Daily NST.  -Continue twice-weekly UA Doppler studies. ----------------------------------------------------------------------                  Noralee Space, MD Electronically Signed Final Report   09/11/2020 09:14 am ----------------------------------------------------------------------    Medications:  Scheduled . aspirin EC  81 mg Oral Daily  . docusate sodium  100 mg Oral Daily  . famotidine  20 mg Oral BID  . NIFEdipine  30 mg Oral Daily  . prenatal multivitamin  1 tablet Oral Q1200  . sertraline  100 mg Oral Daily   I have reviewed the patient's current medications.  ASSESSMENT: G2P0010 [redacted]w[redacted]d Estimated Date of Delivery: 12/09/20  Patient Active Problem List   Diagnosis Date Noted  . Poor fetal growth 09/12/2020  . Chronic hypertension affecting pregnancy 09/11/2020  . Amniotic fluid index borderline low 09/08/2020  . Placental abnormality, antepartum 09/08/2020  . De Quervain's tenosynovitis, right 08/22/2020  . COVID-19 affecting pregnancy in second trimester 06/21/2020  . Chronic hypertension in pregnancy 05/29/2020  . Obesity in pregnancy 05/15/2020  . Depression   . Anxiety   . ADHD (attention deficit hyperactivity disorder)     PLAN: 1)chronic HTN- no evidence of preeclampsia -pt asymptomatic -BP high normal range, currently on ProcardiaXL  daily -continue ASA daily -lab work repeat on 3/30 remain stable, plan to continue as clinically indicated and/or weekly -HA this am, will monitor to see if it improves s/p tylenol.  Also concerned that HA may be due to lack of sleep.  2) FWB- poor fetal growth with intermittent AEDF -AFI/dopplers twice weekly per MFM -s/p betamethasone -  FHT remains the same with occasional decels, variability moderate.  Reviewed MFM recommendations- plan for NST/fetal monitoring 3x daily.   -Breech presentation- pt aware that route of delivery would be C-section  3) Maternal care -anxiety- continue zoloft to  daily -Pepcid twice daily -ok for regular diet this am  DISPO: Continue with in-house monitoring as above.  Pt aware that she will likely remain in the hospital until delivery.  Myna Hidalgo, DO Attending Obstetrician & Gynecologist, [facility] for [employer], [facility]

## 2020-09-14 NOTE — Progress Notes (Signed)
Notified OB of FHR strip with what appeared to be 2 variables and 1 prolonged with minimal variability and no accelerations. Provider reviewed strip with noting variables. Per OB, continue to monitor FHR.

## 2020-09-15 LAB — TYPE AND SCREEN
ABO/RH(D): A POS
Antibody Screen: NEGATIVE

## 2020-09-15 NOTE — Progress Notes (Signed)
FACULTY PRACTICE ANTEPARTUM(COMPREHENSIVE) NOTE  Alyssa Ray is a 30 y.o. G2P0010 with Estimated Date of Delivery: 12/09/20   By  best clinical estimate [redacted]w[redacted]d  who is admitted for fetal growth restriction with intermittent AEDF in the setting of cHTN.    Fetal presentation is breech. Length of Stay:  4  Days  Date of admission:09/11/2020  Subjective: Pt notes no acute complaints overnight.  No headache, no blurry vision, no RUQ pain Patient reports the fetal movement as active. Patient reports uterine contraction  activity as none. Patient reports  vaginal bleeding as none. Patient describes fluid per vagina as None.  Vitals:  Blood pressure (!) 144/80, pulse (!) 58, temperature 98 F (36.7 C), temperature source Oral, resp. rate 18, height 5\' 4"  (1.626 m), weight 136.1 kg, last menstrual period 03/04/2020, SpO2 100 %. Vitals:   09/14/20 2338 09/15/20 0453 09/15/20 0634 09/15/20 0805  BP: (!) 144/65 (!) 102/50 129/74 (!) 144/80  Pulse: (!) 57 (!) 55 (!) 57 (!) 58  Resp: 18 16  18   Temp: 98.4 F (36.9 C) 98.6 F (37 C)  98 F (36.7 C)  TempSrc: Oral Oral  Oral  SpO2: 100% 100%  100%  Weight:      Height:       Physical Examination:  General appearance - alert, well appearing, and in no distress Mental status - appropriate mood and behavior Chest - normal respiratory effort, CTAB Heart - normal rate and regular rhythm Abdomen - soft and non-tender, no rebound, no guarding Extremities - no edema, no calf tenderness Skin - warm and dry  Fetal Monitoring:  Baseline: 150 bpm, Variability: moderate variability, Accelerations: no accels, and Decelerations: variable decel x1    Nonreactive.  FHT reviewed from overnight- reactive NST  Labs:  No results found for this or any previous visit (from the past 24 hour(s)).  Medications:  Scheduled . aspirin EC  81 mg Oral Daily  . docusate sodium  100 mg Oral Daily  . famotidine  20 mg Oral BID  . NIFEdipine  30 mg Oral Daily  .  prenatal multivitamin  1 tablet Oral Q1200  . sertraline  100 mg Oral Daily   I have reviewed the patient's current medications.  ASSESSMENT: G2P0010 [redacted]w[redacted]d Estimated Date of Delivery: 12/09/20  Patient Active Problem List   Diagnosis Date Noted  . Poor fetal growth 09/12/2020  . Chronic hypertension affecting pregnancy 09/11/2020  . Amniotic fluid index borderline low 09/08/2020  . Placental abnormality, antepartum 09/08/2020  . De Quervain's tenosynovitis, right 08/22/2020  . COVID-19 affecting pregnancy in second trimester 06/21/2020  . Chronic hypertension in pregnancy 05/29/2020  . Obesity in pregnancy 05/15/2020  . Depression   . Anxiety   . ADHD (attention deficit hyperactivity disorder)     PLAN: 1)chronic HTN- no evidence of preeclampsia -pt asymptomatic -BP reviewed, remains stable, currently on ProcardiaXL 30mg  daily -continue ASA daily -lab work repeat on 3/30 remain stable, plan to continue as clinically indicated and/or weekly  2) FWB- poor fetal growth with intermittent AEDF -AFI/dopplers twice weekly per MFM -s/p betamethasone - FHT remains- Cat. 2 though overall improved over the past 24hr. Continue with MFM recommendations- plan for NST/fetal monitoring 3x daily.   -Breech presentation- pt aware that route of delivery would be C-section  3) Maternal care -anxiety-on zoloft 100mg  daily -Pepcid twice daily -general diet -2hr Glucola ordered for tomorrow  DISPO: Continue with in-house monitoring as above  05/17/2020, DO Attending Obstetrician & Gynecologist, Faculty Practice  Center for [employer], [facility]

## 2020-09-15 NOTE — Plan of Care (Signed)
  Problem: Nutrition: Goal: Adequate nutrition will be maintained Outcome: Progressing   Problem: Coping: Goal: Level of anxiety will decrease Outcome: Progressing   Problem: Pain Managment: Goal: General experience of comfort will improve Outcome: Progressing   Problem: Education: Goal: Knowledge of the prescribed therapeutic regimen will improve Outcome: Progressing   Problem: Clinical Measurements: Goal: Complications related to the disease process, condition or treatment will be avoided or minimized Outcome: Progressing   Problem: Education: Goal: Knowledge of disease or condition will improve Outcome: Progressing Goal: Knowledge of the prescribed therapeutic regimen will improve Outcome: Progressing   Problem: Clinical Measurements: Goal: Complications related to disease process, condition or treatment will be avoided or minimized Outcome: Progressing

## 2020-09-16 DIAGNOSIS — Z3A28 28 weeks gestation of pregnancy: Secondary | ICD-10-CM

## 2020-09-16 LAB — CBC
HCT: 37.1 % (ref 36.0–46.0)
Hemoglobin: 12.1 g/dL (ref 12.0–15.0)
MCH: 26.7 pg (ref 26.0–34.0)
MCHC: 32.6 g/dL (ref 30.0–36.0)
MCV: 81.9 fL (ref 80.0–100.0)
Platelets: 230 10*3/uL (ref 150–400)
RBC: 4.53 MIL/uL (ref 3.87–5.11)
RDW: 15.2 % (ref 11.5–15.5)
WBC: 12.8 10*3/uL — ABNORMAL HIGH (ref 4.0–10.5)
nRBC: 0 % (ref 0.0–0.2)

## 2020-09-16 LAB — COMPREHENSIVE METABOLIC PANEL
ALT: 26 U/L (ref 0–44)
AST: 23 U/L (ref 15–41)
Albumin: 2.6 g/dL — ABNORMAL LOW (ref 3.5–5.0)
Alkaline Phosphatase: 465 U/L — ABNORMAL HIGH (ref 38–126)
Anion gap: 8 (ref 5–15)
BUN: 8 mg/dL (ref 6–20)
CO2: 21 mmol/L — ABNORMAL LOW (ref 22–32)
Calcium: 8.1 mg/dL — ABNORMAL LOW (ref 8.9–10.3)
Chloride: 104 mmol/L (ref 98–111)
Creatinine, Ser: 0.62 mg/dL (ref 0.44–1.00)
GFR, Estimated: >60 mL/min (ref >60)
Glucose, Bld: 94 mg/dL (ref 70–99)
Potassium: 3.7 mmol/L (ref 3.5–5.1)
Sodium: 133 mmol/L — ABNORMAL LOW (ref 135–145)
Total Bilirubin: 0.5 mg/dL (ref 0.3–1.2)
Total Protein: 5.5 g/dL — ABNORMAL LOW (ref 6.5–8.1)

## 2020-09-16 LAB — GLUCOSE, 2 HOUR: Glucose, 2 hour: 94 mg/dL (ref 70–139)

## 2020-09-16 LAB — GLUCOSE, FASTING: Glucose, Fasting: 79 mg/dL (ref 70–99)

## 2020-09-16 NOTE — Progress Notes (Signed)
Patient ID: Alyssa Ray, female   DOB: 05/30/1991, 30 y.o.   MRN: [id number] FACULTY PRACTICE ANTEPARTUM(COMPREHENSIVE) NOTE  Ruben Pyka is a 30 y.o. G2P0010 at [redacted]w[redacted]d by LMP who is admitted for fetal growth restriction with intermittent AEDF in the setting of cHTN. Marland Kitchen   Fetal presentation is breech. Length of Stay:  5  Days  Subjective:Nurse noted she had RUQ pain  Patient reports the fetal movement as active. Patient reports uterine contraction  activity as none. Patient reports  vaginal bleeding as none. Patient describes fluid per vagina as None.  Vitals:  Blood pressure 136/63, pulse (!) 55, temperature 98.1 F (36.7 C), temperature source Oral, resp. rate 18, height 5\' 4"  (1.626 m), weight 136.1 kg, last menstrual period 03/04/2020, SpO2 99 %. Physical Examination:  General appearance - alert, well appearing, and in no distress Heart - normal rate and regular rhythm Abdomen - soft, nontender, nondistended Fundal Height:  size equals dates Cervical Exam: Not evaluated. Extremities: extremities normal, atraumatic, no cyanosis or edema and Homans sign is negative, no sign of DVT   Membranes:intact   Fetal Monitoring:  Fetal Heart Rate A   Mode External filed at 09/15/2020 2327  Baseline Rate (A) 145 bpm filed at 09/15/2020 2327  Variability 6-25 BPM filed at 09/15/2020 2327  Accelerations 10 x 10 filed at 09/15/2020 2327  Decelerations Variable filed at 09/15/2020 2327  Multiple birth? N filed at 09/15/2020 2327     Labs:  Results for orders placed or performed during the hospital encounter of 09/11/20 (from the past 24 hour(s))  Type and screen [facility]   Collection Time: 09/15/20  6:58 PM  Result Value Ref Range   ABO/RH(D) A POS    Antibody Screen NEG    Sample Expiration      09/18/2020,2359 Performed at [facility], [address] [place], [place]   Glucose, fasting   Collection Time: 09/16/20  5:24 AM  Result Value Ref  Range   Glucose, Fasting 79 70 - 99 mg/dL   CMP     Component Value Date/Time   NA 136 09/13/2020 0712   NA 139 09/08/2020 1034   NA 138 01/18/2014 2152   K 4.0 09/13/2020 0712   K 3.6 01/18/2014 2152   CL 108 09/13/2020 0712   CL 108 (H) 01/18/2014 2152   CO2 20 (L) 09/13/2020 0712   CO2 23 01/18/2014 2152   GLUCOSE 75 09/13/2020 0712   GLUCOSE 95 01/18/2014 2152   BUN 8 09/13/2020 0712   BUN 8 09/08/2020 1034   BUN 6 (L) 01/18/2014 2152   CREATININE 0.50 09/13/2020 0712   CREATININE 0.71 01/18/2014 2152   CALCIUM 8.4 (L) 09/13/2020 0712   CALCIUM 8.4 (L) 01/18/2014 2152   PROT 5.7 (L) 09/13/2020 0712   PROT 6.1 09/08/2020 1034   PROT 7.1 01/18/2014 2152   ALBUMIN 2.5 (L) 09/13/2020 0712   ALBUMIN 3.6 (L) 09/08/2020 1034   ALBUMIN 3.2 (L) 01/18/2014 2152   AST 15 09/13/2020 0712   AST 18 01/18/2014 2152   ALT 14 09/13/2020 0712   ALT 23 01/18/2014 2152   ALKPHOS 483 (H) 09/13/2020 0712   ALKPHOS 84 01/18/2014 2152   BILITOT 0.6 09/13/2020 0712   BILITOT <0.2 09/08/2020 1034   BILITOT 0.4 01/18/2014 2152   GFRNONAA >60 09/13/2020 0712   GFRNONAA >60 01/18/2014 2152   GFRAA 150 05/26/2020 1010   GFRAA >60 01/18/2014 2152      Medications:  Scheduled . aspirin EC  81 mg Oral Daily  . docusate sodium  100 mg Oral Daily  . famotidine  20 mg Oral BID  . NIFEdipine  30 mg Oral Daily  . prenatal multivitamin  1 tablet Oral Q1200  . sertraline  100 mg Oral Daily   I have reviewed the patient's current medications.  ASSESSMENT: Patient Active Problem List   Diagnosis Date Noted  . Poor fetal growth 09/12/2020  . Chronic hypertension affecting pregnancy 09/11/2020  . Amniotic fluid index borderline low 09/08/2020  . Placental abnormality, antepartum 09/08/2020  . De Quervain's tenosynovitis, right 08/22/2020  . COVID-19 affecting pregnancy in second trimester 06/21/2020  . Chronic hypertension in pregnancy 05/29/2020  . Obesity in pregnancy 05/15/2020  .  Supervision of low-risk pregnancy 05/15/2020  . Depression   . Anxiety   . ADHD (attention deficit hyperactivity disorder)     PLAN: Fetal monitoring, repeat labs and 2 hr GTT today  Scheryl Darter 09/16/2020,7:48 AM

## 2020-09-17 ENCOUNTER — Inpatient Hospital Stay (HOSPITAL_BASED_OUTPATIENT_CLINIC_OR_DEPARTMENT_OTHER): Payer: Medicaid Other

## 2020-09-17 ENCOUNTER — Encounter (HOSPITAL_COMMUNITY)
Admission: AD | Disposition: A | Discharge status: Home / Self Care | Payer: Self-pay | Source: Home / Self Care | Attending: Obstetrics and Gynecology

## 2020-09-17 DIAGNOSIS — Z3A28 28 weeks gestation of pregnancy: Secondary | ICD-10-CM

## 2020-09-17 DIAGNOSIS — O10013 Pre-existing essential hypertension complicating pregnancy, third trimester: Secondary | ICD-10-CM | POA: Diagnosis not present

## 2020-09-17 DIAGNOSIS — O4103X0 Oligohydramnios, third trimester, not applicable or unspecified: Secondary | ICD-10-CM | POA: Diagnosis not present

## 2020-09-17 DIAGNOSIS — O365930 Maternal care for other known or suspected poor fetal growth, third trimester, not applicable or unspecified: Secondary | ICD-10-CM | POA: Diagnosis not present

## 2020-09-17 DIAGNOSIS — Z87798 Personal history of other (corrected) congenital malformations: Secondary | ICD-10-CM

## 2020-09-17 DIAGNOSIS — Z8616 Personal history of COVID-19: Secondary | ICD-10-CM

## 2020-09-17 DIAGNOSIS — O99213 Obesity complicating pregnancy, third trimester: Secondary | ICD-10-CM | POA: Diagnosis not present

## 2020-09-17 DIAGNOSIS — O321XX0 Maternal care for breech presentation, not applicable or unspecified: Secondary | ICD-10-CM

## 2020-09-17 DIAGNOSIS — O99343 Other mental disorders complicating pregnancy, third trimester: Secondary | ICD-10-CM

## 2020-09-17 DIAGNOSIS — E669 Obesity, unspecified: Secondary | ICD-10-CM

## 2020-09-17 DIAGNOSIS — O368330 Maternal care for abnormalities of the fetal heart rate or rhythm, third trimester, not applicable or unspecified: Secondary | ICD-10-CM

## 2020-09-17 SURGERY — Surgical Case
Anesthesia: Spinal

## 2020-09-17 MED ORDER — OXYTOCIN-SODIUM CHLORIDE 30-0.9 UT/500ML-% IV SOLN
INTRAVENOUS | Status: AC
  Filled 2020-09-17: qty 500

## 2020-09-17 MED ORDER — PHENYLEPHRINE HCL-NACL 20-0.9 MG/250ML-% IV SOLN
INTRAVENOUS | Status: AC
  Filled 2020-09-17: qty 250

## 2020-09-17 MED ORDER — LACTATED RINGERS IV BOLUS
1000.0000 mL | Freq: Once | INTRAVENOUS | Status: AC
  Administered 2020-09-17: 1000 mL via INTRAVENOUS

## 2020-09-17 MED ORDER — FENTANYL CITRATE (PF) 100 MCG/2ML IJ SOLN
INTRAMUSCULAR | Status: AC
  Filled 2020-09-17: qty 2

## 2020-09-17 MED ORDER — CEFAZOLIN SODIUM-DEXTROSE 2-4 GM/100ML-% IV SOLN
2.0000 g | Freq: Once | INTRAVENOUS | Status: DC
  Filled 2020-09-17: qty 100

## 2020-09-17 MED ORDER — MORPHINE SULFATE (PF) 0.5 MG/ML IJ SOLN
INTRAMUSCULAR | Status: AC
  Filled 2020-09-17: qty 10

## 2020-09-17 MED ORDER — SOD CITRATE-CITRIC ACID 500-334 MG/5ML PO SOLN
ORAL | Status: AC
  Filled 2020-09-17: qty 15

## 2020-09-17 MED ORDER — ONDANSETRON HCL 4 MG/2ML IJ SOLN
INTRAMUSCULAR | Status: AC
  Filled 2020-09-17: qty 2

## 2020-09-17 MED ORDER — DEXAMETHASONE SODIUM PHOSPHATE 4 MG/ML IJ SOLN
INTRAMUSCULAR | Status: AC
  Filled 2020-09-17: qty 1

## 2020-09-17 SURGICAL SUPPLY — 41 items
BENZOIN TINCTURE PRP APPL 2/3 (GAUZE/BANDAGES/DRESSINGS) ×2 IMPLANT
CANISTER WOUND CARE 500ML ATS (WOUND CARE) ×2 IMPLANT
CHLORAPREP W/TINT 26ML (MISCELLANEOUS) ×2 IMPLANT
CLAMP CORD UMBIL (MISCELLANEOUS) IMPLANT
CLOTH BEACON ORANGE TIMEOUT ST (SAFETY) ×2 IMPLANT
DRAPE C SECTION CLR SCREEN (DRAPES) IMPLANT
DRESSING PREVENA PLUS CUSTOM (GAUZE/BANDAGES/DRESSINGS) ×1 IMPLANT
DRSG OPSITE POSTOP 4X10 (GAUZE/BANDAGES/DRESSINGS) ×2 IMPLANT
DRSG PREVENA PLUS CUSTOM (GAUZE/BANDAGES/DRESSINGS) ×2
ELECT REM PT RETURN 9FT ADLT (ELECTROSURGICAL) ×2
ELECTRODE REM PT RTRN 9FT ADLT (ELECTROSURGICAL) ×1 IMPLANT
EXTRACTOR VACUUM M CUP 4 TUBE (SUCTIONS) IMPLANT
GLOVE BIO SURGEON STRL SZ7.5 (GLOVE) ×2 IMPLANT
GLOVE BIOGEL PI IND STRL 7.0 (GLOVE) ×1 IMPLANT
GLOVE BIOGEL PI INDICATOR 7.0 (GLOVE) ×1
GOWN STRL REUS W/TWL 2XL LVL3 (GOWN DISPOSABLE) ×2 IMPLANT
GOWN STRL REUS W/TWL LRG LVL3 (GOWN DISPOSABLE) ×4 IMPLANT
KIT ABG SYR 3ML LUER SLIP (SYRINGE) IMPLANT
NEEDLE HYPO 22GX1.5 SAFETY (NEEDLE) ×2 IMPLANT
NEEDLE HYPO 25X5/8 SAFETYGLIDE (NEEDLE) IMPLANT
NS IRRIG 1000ML POUR BTL (IV SOLUTION) ×2 IMPLANT
PACK C SECTION WH (CUSTOM PROCEDURE TRAY) ×2 IMPLANT
PAD OB MATERNITY 4.3X12.25 (PERSONAL CARE ITEMS) ×2 IMPLANT
PENCIL SMOKE EVAC W/HOLSTER (ELECTROSURGICAL) ×2 IMPLANT
RTRCTR C-SECT PINK 25CM LRG (MISCELLANEOUS) ×2 IMPLANT
STRIP CLOSURE SKIN 1/2X4 (GAUZE/BANDAGES/DRESSINGS) ×2 IMPLANT
SUT CHROMIC 1 CTX 36 (SUTURE) ×4 IMPLANT
SUT PLAIN 0 NONE (SUTURE) IMPLANT
SUT VIC AB 1 CT1 36 (SUTURE) ×2 IMPLANT
SUT VIC AB 2-0 CT1 (SUTURE) ×2 IMPLANT
SUT VIC AB 2-0 CT1 27 (SUTURE) ×1
SUT VIC AB 2-0 CT1 TAPERPNT 27 (SUTURE) ×1 IMPLANT
SUT VIC AB 3-0 CT1 27 (SUTURE) ×1
SUT VIC AB 3-0 CT1 TAPERPNT 27 (SUTURE) ×1 IMPLANT
SUT VIC AB 3-0 SH 27 (SUTURE)
SUT VIC AB 3-0 SH 27X BRD (SUTURE) IMPLANT
SUT VIC AB 4-0 KS 27 (SUTURE) ×2 IMPLANT
SYR BULB IRRIGATION 50ML (SYRINGE) IMPLANT
TOWEL OR 17X24 6PK STRL BLUE (TOWEL DISPOSABLE) ×2 IMPLANT
TRAY FOLEY W/BAG SLVR 14FR LF (SET/KITS/TRAYS/PACK) ×2 IMPLANT
WATER STERILE IRR 1000ML POUR (IV SOLUTION) ×2 IMPLANT

## 2020-09-17 NOTE — Progress Notes (Addendum)
Daily Antepartum Note  Admission Date: 09/11/2020 Current Date: 09/17/2020 9:30 AM  Alyssa Ray is a 30 y.o. G2P0010 @ [redacted]w[redacted]d, HD#7, admitted for AEDF on UA dopplers.  Pregnancy complicated by: Patient Active Problem List   Diagnosis Date Noted  . Poor fetal growth 09/12/2020  . Chronic hypertension affecting pregnancy 09/11/2020  . Amniotic fluid index borderline low 09/08/2020  . Placental abnormality, antepartum 09/08/2020  . De Quervain's tenosynovitis, right 08/22/2020  . COVID-19 affecting pregnancy in second trimester 06/21/2020  . Chronic hypertension in pregnancy 05/29/2020  . Obesity in pregnancy 05/15/2020  . Supervision of low-risk pregnancy 05/15/2020  . Depression   . Anxiety   . ADHD (attention deficit hyperactivity disorder)     Overnight/24hr events:  Continued variables and episodic decel IV removed due to infiltration noted with IVF  Subjective:  No s/s of pre-eclampsia or PTL  Objective:    Current Vital Signs 24h Vital Sign Ranges  T 97.9 F (36.6 C) Temp  Avg: 98 F (36.7 C)  Min: 97.8 F (36.6 C)  Max: 98.3 F (36.8 C)  BP (!) 144/82 BP  Min: 129/69  Max: 154/78  HR (!) 55 Pulse  Avg: 61.4  Min: 55  Max: 67  RR 18 Resp  Avg: 18.7  Min: 18  Max: 20  SaO2 99 % Room Air SpO2  Avg: 97.7 %  Min: 93 %  Max: 99 %       24 Hour I/O Current Shift I/O  Time Ins Outs No intake/output data recorded. No intake/output data recorded.   Patient Vitals for the past 24 hrs:  BP Temp Temp src Pulse Resp SpO2  09/17/20 0810 (!) 144/82 -- -- (!) 55 -- --  09/17/20 0750 (!) 150/97 97.9 F (36.6 C) Oral 67 18 99 %  09/17/20 0317 139/73 98.1 F (36.7 C) Oral (!) 58 18 99 %  09/16/20 2304 129/69 98 F (36.7 C) Oral 61 20 99 %  09/16/20 2046 (!) 154/78 98.3 F (36.8 C) Oral 61 20 99 %  09/16/20 1535 (!) 154/89 97.8 F (36.6 C) Oral 67 18 93 %  09/16/20 1200 139/68 98.1 F (36.7 C) Oral 61 18 97 %   FHT: 140 baseline, ?accels, episode variables and  decelerations (1-1m in length), moderate variability, normal baseline Toco: quiet  Physical exam: General: Well nourished, well developed female in no acute distress. Abdomen: gravid nttp Cardiovascular: S1, S2 normal, no murmur, rub or gallop, regular rate and rhythm Respiratory: CTAB Extremities: no clubbing, cyanosis or edema Skin: Warm and dry.   Medications: Current Facility-Administered Medications  Medication Dose Route Frequency Provider Last Rate Last Admin  . acetaminophen (TYLENOL) tablet 650 mg  650 mg Oral Q4H PRN Myna Hidalgo, DO   650 mg at 09/16/20 2040  . aspirin EC tablet 81 mg  81 mg Oral Daily Myna Hidalgo, DO   81 mg at 09/16/20 1004  . calcium carbonate (TUMS - dosed in mg elemental calcium) chewable tablet 400 mg of elemental calcium  2 tablet Oral Q4H PRN Myna Hidalgo, DO      . diphenhydrAMINE (BENADRYL) capsule 50 mg  50 mg Oral Q6H PRN Myna Hidalgo, DO   50 mg at 09/16/20 0245  . docusate sodium (COLACE) capsule 100 mg  100 mg Oral Daily Ozan, Jennifer, DO   100 mg at 09/16/20 1004  . famotidine (PEPCID) tablet 20 mg  20 mg Oral BID Myna Hidalgo, DO   20 mg at 09/16/20 2257  .  labetalol (NORMODYNE) injection 20 mg  20 mg Intravenous PRN Myna Hidalgo, DO       And  . labetalol (NORMODYNE) injection 40 mg  40 mg Intravenous PRN Myna Hidalgo, DO       And  . labetalol (NORMODYNE) injection 80 mg  80 mg Intravenous PRN Myna Hidalgo, DO       And  . hydrALAZINE (APRESOLINE) injection 10 mg  10 mg Intravenous PRN Myna Hidalgo, DO      . hydrOXYzine (ATARAX/VISTARIL) tablet 25 mg  25 mg Oral TID PRN [place] Bing, MD   25 mg at 09/14/20 0140  . lactated ringers infusion   Intravenous Continuous [place] Bing, MD   Stopped at 09/15/20 1400  . NIFEdipine (PROCARDIA-XL/NIFEDICAL-XL) 24 hr tablet 30 mg  30 mg Oral Daily Ozan, Jennifer, DO   30 mg at 09/16/20 1004  . ondansetron (ZOFRAN) injection 4 mg  4 mg Intravenous Q6H PRN Conan Bowens, MD    4 mg at 09/12/20 1220  . ondansetron (ZOFRAN) tablet 4 mg  4 mg Oral Q8H PRN Myna Hidalgo, DO   4 mg at 09/11/20 1539  . prenatal multivitamin tablet 1 tablet  1 tablet Oral Q1200 Myna Hidalgo, DO   1 tablet at 09/16/20 1255  . promethazine (PHENERGAN) 25 mg in sodium chloride 0.9 % 50 mL IVPB  25 mg Intravenous Q6H PRN Conan Bowens, MD 200 mL/hr at 09/13/20 1841 25 mg at 09/13/20 1841  . sertraline (ZOLOFT) tablet 100 mg  100 mg Oral Daily Ozan, Jennifer, DO   100 mg at 09/16/20 1004  . zolpidem (AMBIEN) tablet 5 mg  5 mg Oral QHS PRN Myna Hidalgo, DO   5 mg at 09/16/20 2302    Labs:  Recent Labs  Lab 09/11/20 1016 09/13/20 0712 09/16/20 0856  WBC 14.3* 11.5* 12.8*  HGB 12.7 12.7 12.1  HCT 37.9 39.6 37.1  PLT 228 214 230    Recent Labs  Lab 09/11/20 1016 09/13/20 0712 09/16/20 0856  NA 135 136 133*  K 3.6 4.0 3.7  CL 106 108 104  CO2 22 20* 21*  BUN 10 8 8   CREATININE 0.62 0.50 0.62  CALCIUM 8.7* 8.4* 8.1*  PROT 5.7* 5.7* 5.5*  BILITOT 0.5 0.6 0.5  ALKPHOS 431* 483* 465*  ALT 13 14 26   AST 16 15 23   GLUCOSE 106* 75 94     Radiology:  No new imaging 3/31: breech, mvp 2.6-3cm, aedf 3/23: 12%, 843gm, ac 13%, mvp 3.17, aedf  Assessment & Plan:  Pt stable *Pregnancy: EFM stable for patient. She has moderate variability and occasional accels. Continue with EFM but if loses variability, dopplers or AF worsen or decels worsen then would need delivery. Pt has rpt dopplers tomorrow  Normal fasting and 2h GTT yesterday but 1h not done. Can decide later this week if to repeat *cHTN: continue procardia *Preterm: s/p bmz on 3/23 and 3/24. eligible for rescue steroids on 4/8. S/p nicu consult *Psych: continue sertraline *PPx: scds *FEN/GI: sliv *Dispo: inpatient until delivery  4/23 MD Attending Center for [facility] (Faculty Practice) GYN Consult Phone: [phone] (M-F, 0800-1700) & [phone] (Off hours, weekends, holidays)

## 2020-09-17 NOTE — Progress Notes (Signed)
Patient ID: Alyssa Ray, female   DOB: 02-Jun-1991, 30 y.o.   MRN: [id number] Pt having repetitive variable decelerations now for the last 1 1/2 hours. Feel delivery is indicated C section recommended to pt. R/B/Post op care reviewed with pt. Pt verbalized understanding. Nursing, L& D OR, Anesthesia and NICU made aware. To OR when ready

## 2020-09-17 NOTE — Anesthesia Preprocedure Evaluation (Addendum)
Anesthesia Evaluation  Patient identified by MRN, date of birth, ID band Patient awake    Reviewed: Allergy & Precautions, NPO status , Patient's Chart, lab work & pertinent test results  History of Anesthesia Complications (+) PONV and history of anesthetic complications  Airway Mallampati: III  TM Distance: >3 FB Neck ROM: Full    Dental no notable dental hx.    Pulmonary former smoker,    Pulmonary exam normal breath sounds clear to auscultation       Cardiovascular hypertension, Normal cardiovascular exam Rhythm:Regular Rate:Normal     Neuro/Psych  Headaches, PSYCHIATRIC DISORDERS Anxiety Depression Bipolar Disorder    GI/Hepatic Neg liver ROS, GERD  ,  Endo/Other  Morbid obesity  Renal/GU negative Renal ROS  negative genitourinary   Musculoskeletal negative musculoskeletal ROS (+)   Abdominal (+) + obese,   Peds negative pediatric ROS (+)  Hematology negative hematology ROS (+)   Anesthesia Other Findings   Reproductive/Obstetrics negative OB ROS                             Anesthesia Physical Anesthesia Plan  ASA: IV and emergent  Anesthesia Plan: Spinal   Post-op Pain Management:    Induction:   PONV Risk Score and Plan: 3 and Scopolamine patch - Pre-op, Treatment may vary due to age or medical condition, Ondansetron and Dexamethasone  Airway Management Planned: Natural Airway  Additional Equipment: None  Intra-op Plan:   Post-operative Plan:   Informed Consent: I have reviewed the patients History and Physical, chart, labs and discussed the procedure including the risks, benefits and alternatives for the proposed anesthesia with the patient or authorized representative who has indicated his/her understanding and acceptance.       Plan Discussed with: Anesthesiologist and Surgeon  Anesthesia Plan Comments: (FHR has recovered at time of interview and plan is for  a spinal. GETA/RSI as backup plan if needed to proceed more quickly due to maternal/fetal reasons. Patient last ate around 6pm and understands the risk of aspiration, especially if GETA is needed. All questions answered and consent signed. Tanna Furry, MD  \  Patient with nonfunctional IV upon presentation to the OR area. Patient a known difficult IV who has gone through several IV's during her inpatient stay, including 2-3 infiltrated IVs today. Patient states she has needed a PICC line during hospitalizations in the past. Case is now delayed while we acquire IV access. Tanna Furry, MD 0000 on 04/Apr/2022  )       Anesthesia Quick Evaluation

## 2020-09-17 NOTE — Progress Notes (Signed)
DR Vergie Living on unit notified of IV infiltration  States we can leave it out  PT is an difficult STICK    Iv team notified for NSL

## 2020-09-18 ENCOUNTER — Inpatient Hospital Stay (HOSPITAL_COMMUNITY): Payer: Medicaid Other | Admitting: Anesthesiology

## 2020-09-18 ENCOUNTER — Ambulatory Visit: Payer: Medicaid Other

## 2020-09-18 ENCOUNTER — Encounter (HOSPITAL_COMMUNITY): Payer: Self-pay | Admitting: Obstetrics & Gynecology

## 2020-09-18 DIAGNOSIS — Z3A28 28 weeks gestation of pregnancy: Secondary | ICD-10-CM

## 2020-09-18 DIAGNOSIS — O365930 Maternal care for other known or suspected poor fetal growth, third trimester, not applicable or unspecified: Secondary | ICD-10-CM

## 2020-09-18 DIAGNOSIS — O321XX0 Maternal care for breech presentation, not applicable or unspecified: Secondary | ICD-10-CM

## 2020-09-18 DIAGNOSIS — O1092 Unspecified pre-existing hypertension complicating childbirth: Secondary | ICD-10-CM

## 2020-09-18 LAB — CBC
HCT: 37.8 % (ref 36.0–46.0)
Hemoglobin: 12.2 g/dL (ref 12.0–15.0)
MCH: 26.3 pg (ref 26.0–34.0)
MCHC: 32.3 g/dL (ref 30.0–36.0)
MCV: 81.5 fL (ref 80.0–100.0)
Platelets: 233 10*3/uL (ref 150–400)
RBC: 4.64 MIL/uL (ref 3.87–5.11)
RDW: 15.1 % (ref 11.5–15.5)
WBC: 20.9 10*3/uL — ABNORMAL HIGH (ref 4.0–10.5)
nRBC: 0 % (ref 0.0–0.2)

## 2020-09-18 LAB — CREATININE, SERUM
Creatinine, Ser: 0.59 mg/dL (ref 0.44–1.00)
GFR, Estimated: >60 mL/min (ref >60)

## 2020-09-18 MED ORDER — NALBUPHINE HCL 10 MG/ML IJ SOLN
5.0000 mg | INTRAMUSCULAR | Status: DC | PRN
  Administered 2020-09-18: 5 mg via INTRAVENOUS
  Filled 2020-09-18 (×2): qty 1

## 2020-09-18 MED ORDER — SODIUM CHLORIDE 0.9 % IR SOLN
Status: DC | PRN
  Administered 2020-09-18: 1

## 2020-09-18 MED ORDER — HYDROMORPHONE HCL 2 MG PO TABS
2.0000 mg | ORAL_TABLET | ORAL | Status: DC | PRN
Start: 2020-09-18 — End: 2020-09-21

## 2020-09-18 MED ORDER — KETOROLAC TROMETHAMINE 30 MG/ML IJ SOLN
INTRAMUSCULAR | Status: DC | PRN
  Administered 2020-09-18: 30 mg via INTRAVENOUS

## 2020-09-18 MED ORDER — WITCH HAZEL-GLYCERIN EX PADS: 1.0000 "application " | MEDICATED_PAD | CUTANEOUS | Status: DC | PRN

## 2020-09-18 MED ORDER — ENOXAPARIN SODIUM 80 MG/0.8ML ~~LOC~~ SOLN
70.0000 mg | SUBCUTANEOUS | Status: DC
  Administered 2020-09-18 – 2020-09-20 (×3): 70 mg via SUBCUTANEOUS
  Filled 2020-09-18 (×3): qty 0.8

## 2020-09-18 MED ORDER — ONDANSETRON HCL 4 MG/2ML IJ SOLN
INTRAMUSCULAR | Status: DC | PRN
  Administered 2020-09-18: 4 mg via INTRAVENOUS

## 2020-09-18 MED ORDER — HYDROMORPHONE HCL 1 MG/ML IJ SOLN
1.0000 mg | INTRAMUSCULAR | Status: DC | PRN
Start: 2020-09-18 — End: 2020-09-21
  Administered 2020-09-18: 1 mg via INTRAVENOUS
  Filled 2020-09-18: qty 1

## 2020-09-18 MED ORDER — SERTRALINE HCL 50 MG PO TABS
100.0000 mg | ORAL_TABLET | Freq: Every day | ORAL | Status: DC
  Administered 2020-09-18 – 2020-09-21 (×4): 100 mg via ORAL
  Filled 2020-09-18 (×4): qty 2

## 2020-09-18 MED ORDER — DIPHENHYDRAMINE HCL 25 MG PO CAPS: 25.0000 mg | ORAL_CAPSULE | Freq: Four times a day (QID) | ORAL | Status: DC | PRN

## 2020-09-18 MED ORDER — ACETAMINOPHEN 500 MG PO TABS
1000.0000 mg | ORAL_TABLET | Freq: Four times a day (QID) | ORAL | Status: AC
  Administered 2020-09-18 – 2020-09-19 (×2): 1000 mg via ORAL
  Filled 2020-09-18 (×2): qty 2

## 2020-09-18 MED ORDER — OXYCODONE HCL 5 MG/5ML PO SOLN: 5.0000 mg | Freq: Once | ORAL | Status: DC | PRN

## 2020-09-18 MED ORDER — COCONUT OIL OIL
1.0000 "application " | TOPICAL_OIL | Status: DC | PRN
  Administered 2020-09-18: 1 via TOPICAL

## 2020-09-18 MED ORDER — NALBUPHINE HCL 10 MG/ML IJ SOLN: 5.0000 mg | Freq: Once | INTRAMUSCULAR | Status: AC | PRN

## 2020-09-18 MED ORDER — FENTANYL CITRATE (PF) 100 MCG/2ML IJ SOLN
INTRAMUSCULAR | Status: DC | PRN
  Administered 2020-09-18: 15 ug via INTRATHECAL

## 2020-09-18 MED ORDER — NALBUPHINE HCL 10 MG/ML IJ SOLN
5.0000 mg | Freq: Once | INTRAMUSCULAR | Status: AC | PRN
  Administered 2020-09-18: 5 mg via INTRAVENOUS

## 2020-09-18 MED ORDER — PRENATAL MULTIVITAMIN CH
1.0000 | ORAL_TABLET | Freq: Every day | ORAL | Status: DC
  Administered 2020-09-18 – 2020-09-20 (×3): 1 via ORAL
  Filled 2020-09-18 (×3): qty 1

## 2020-09-18 MED ORDER — SIMETHICONE 80 MG PO CHEW
80.0000 mg | CHEWABLE_TABLET | Freq: Three times a day (TID) | ORAL | Status: DC
  Administered 2020-09-18 – 2020-09-21 (×10): 80 mg via ORAL
  Filled 2020-09-18 (×10): qty 1

## 2020-09-18 MED ORDER — ZOLPIDEM TARTRATE 5 MG PO TABS: 5.0000 mg | ORAL_TABLET | Freq: Every evening | ORAL | Status: DC | PRN

## 2020-09-18 MED ORDER — ENOXAPARIN SODIUM 40 MG/0.4ML ~~LOC~~ SOLN: 40.0000 mg | SUBCUTANEOUS | Status: DC

## 2020-09-18 MED ORDER — LACTATED RINGERS IV SOLN: INTRAVENOUS | Status: DC | PRN

## 2020-09-18 MED ORDER — NALBUPHINE HCL 10 MG/ML IJ SOLN
5.0000 mg | INTRAMUSCULAR | Status: DC | PRN
  Administered 2020-09-18: 5 mg via SUBCUTANEOUS
  Filled 2020-09-18: qty 1

## 2020-09-18 MED ORDER — DIPHENHYDRAMINE HCL 25 MG PO CAPS: 25.0000 mg | ORAL_CAPSULE | ORAL | Status: DC | PRN

## 2020-09-18 MED ORDER — MENTHOL 3 MG MT LOZG: 1.0000 | LOZENGE | OROMUCOSAL | Status: DC | PRN

## 2020-09-18 MED ORDER — SODIUM CHLORIDE 0.9% FLUSH: 3.0000 mL | INTRAVENOUS | Status: DC | PRN

## 2020-09-18 MED ORDER — OXYCODONE HCL 5 MG PO TABS
5.0000 mg | ORAL_TABLET | ORAL | Status: DC | PRN
  Administered 2020-09-18 – 2020-09-21 (×12): 10 mg via ORAL
  Filled 2020-09-18: qty 1
  Filled 2020-09-18 (×4): qty 2
  Filled 2020-09-18: qty 1
  Filled 2020-09-18 (×7): qty 2

## 2020-09-18 MED ORDER — ONDANSETRON HCL 4 MG/2ML IJ SOLN: 4.0000 mg | Freq: Three times a day (TID) | INTRAMUSCULAR | Status: DC | PRN

## 2020-09-18 MED ORDER — SIMETHICONE 80 MG PO CHEW: 80.0000 mg | CHEWABLE_TABLET | ORAL | Status: DC | PRN

## 2020-09-18 MED ORDER — KETOROLAC TROMETHAMINE 30 MG/ML IJ SOLN
INTRAMUSCULAR | Status: AC
  Filled 2020-09-18: qty 1

## 2020-09-18 MED ORDER — KETOROLAC TROMETHAMINE 30 MG/ML IJ SOLN
30.0000 mg | Freq: Four times a day (QID) | INTRAMUSCULAR | Status: DC
  Administered 2020-09-18: 30 mg via INTRAVENOUS
  Filled 2020-09-18: qty 1

## 2020-09-18 MED ORDER — TETANUS-DIPHTH-ACELL PERTUSSIS 5-2.5-18.5 LF-MCG/0.5 IM SUSY
0.5000 mL | PREFILLED_SYRINGE | Freq: Once | INTRAMUSCULAR | Status: AC
  Administered 2020-09-20: 0.5 mL via INTRAMUSCULAR
  Filled 2020-09-18: qty 0.5

## 2020-09-18 MED ORDER — OXYTOCIN-SODIUM CHLORIDE 30-0.9 UT/500ML-% IV SOLN
2.5000 [IU]/h | INTRAVENOUS | Status: AC
  Administered 2020-09-18: 2.5 [IU]/h via INTRAVENOUS

## 2020-09-18 MED ORDER — DEXTROSE 5 % IV SOLN
INTRAVENOUS | Status: DC | PRN
  Administered 2020-09-18: 3 g via INTRAVENOUS

## 2020-09-18 MED ORDER — PROMETHAZINE HCL 25 MG/ML IJ SOLN: 6.2500 mg | INTRAMUSCULAR | Status: DC | PRN

## 2020-09-18 MED ORDER — FENTANYL CITRATE (PF) 100 MCG/2ML IJ SOLN
INTRAMUSCULAR | Status: AC
  Filled 2020-09-18: qty 2

## 2020-09-18 MED ORDER — DEXAMETHASONE SODIUM PHOSPHATE 4 MG/ML IJ SOLN
INTRAMUSCULAR | Status: DC | PRN
  Administered 2020-09-18: 4 mg via INTRAVENOUS

## 2020-09-18 MED ORDER — AMISULPRIDE (ANTIEMETIC) 5 MG/2ML IV SOLN: 10.0000 mg | Freq: Once | INTRAVENOUS | Status: DC | PRN

## 2020-09-18 MED ORDER — KETOROLAC TROMETHAMINE 30 MG/ML IJ SOLN: 30.0000 mg | Freq: Four times a day (QID) | INTRAMUSCULAR | Status: DC | PRN

## 2020-09-18 MED ORDER — FENTANYL CITRATE (PF) 100 MCG/2ML IJ SOLN
INTRAMUSCULAR | Status: DC | PRN
  Administered 2020-09-18: 25 ug via INTRAVENOUS
  Administered 2020-09-18: 35 ug via INTRAVENOUS
  Administered 2020-09-18: 25 ug via INTRAVENOUS

## 2020-09-18 MED ORDER — ACETAMINOPHEN 10 MG/ML IV SOLN
1000.0000 mg | Freq: Once | INTRAVENOUS | Status: DC | PRN
  Administered 2020-09-18: 1000 mg via INTRAVENOUS

## 2020-09-18 MED ORDER — IBUPROFEN 800 MG PO TABS
800.0000 mg | ORAL_TABLET | Freq: Three times a day (TID) | ORAL | Status: DC
  Administered 2020-09-18 – 2020-09-21 (×8): 800 mg via ORAL
  Filled 2020-09-18 (×8): qty 1

## 2020-09-18 MED ORDER — IBUPROFEN 800 MG PO TABS: 800.0000 mg | ORAL_TABLET | Freq: Three times a day (TID) | ORAL | Status: DC

## 2020-09-18 MED ORDER — DIPHENHYDRAMINE HCL 50 MG/ML IJ SOLN: 12.5000 mg | INTRAMUSCULAR | Status: DC | PRN

## 2020-09-18 MED ORDER — PHENYLEPHRINE HCL-NACL 20-0.9 MG/250ML-% IV SOLN
INTRAVENOUS | Status: DC | PRN
  Administered 2020-09-18: 30 ug/min via INTRAVENOUS

## 2020-09-18 MED ORDER — BUPIVACAINE IN DEXTROSE 0.75-8.25 % IT SOLN
INTRATHECAL | Status: DC | PRN
  Administered 2020-09-18: 1.6 mL via INTRATHECAL

## 2020-09-18 MED ORDER — NIFEDIPINE ER OSMOTIC RELEASE 30 MG PO TB24
30.0000 mg | ORAL_TABLET | Freq: Every day | ORAL | Status: DC
  Administered 2020-09-18 – 2020-09-21 (×4): 30 mg via ORAL
  Filled 2020-09-18 (×4): qty 1

## 2020-09-18 MED ORDER — DIBUCAINE (PERIANAL) 1 % EX OINT: 1.0000 "application " | TOPICAL_OINTMENT | CUTANEOUS | Status: DC | PRN

## 2020-09-18 MED ORDER — MEPERIDINE HCL 25 MG/ML IJ SOLN: 6.2500 mg | INTRAMUSCULAR | Status: DC | PRN

## 2020-09-18 MED ORDER — NALOXONE HCL 0.4 MG/ML IJ SOLN: 0.4000 mg | INTRAMUSCULAR | Status: DC | PRN

## 2020-09-18 MED ORDER — MORPHINE SULFATE (PF) 0.5 MG/ML IJ SOLN
INTRAMUSCULAR | Status: DC | PRN
  Administered 2020-09-18: .15 mg via INTRATHECAL

## 2020-09-18 MED ORDER — ACETAMINOPHEN 500 MG PO TABS
1000.0000 mg | ORAL_TABLET | Freq: Four times a day (QID) | ORAL | Status: DC
  Administered 2020-09-18: 1000 mg via ORAL
  Filled 2020-09-18 (×2): qty 2

## 2020-09-18 MED ORDER — KETOROLAC TROMETHAMINE 30 MG/ML IJ SOLN: 30.0000 mg | Freq: Four times a day (QID) | INTRAMUSCULAR | Status: DC

## 2020-09-18 MED ORDER — ACETAMINOPHEN 10 MG/ML IV SOLN
INTRAVENOUS | Status: AC
  Filled 2020-09-18: qty 100

## 2020-09-18 MED ORDER — FENTANYL CITRATE (PF) 100 MCG/2ML IJ SOLN
25.0000 ug | INTRAMUSCULAR | Status: DC | PRN
  Administered 2020-09-18: 25 ug via INTRAVENOUS

## 2020-09-18 MED ORDER — STERILE WATER FOR IRRIGATION IR SOLN
Status: DC | PRN
  Administered 2020-09-18: 1000 mL

## 2020-09-18 MED ORDER — OXYTOCIN-SODIUM CHLORIDE 30-0.9 UT/500ML-% IV SOLN
INTRAVENOUS | Status: DC | PRN
  Administered 2020-09-18: 350 mL via INTRAVENOUS

## 2020-09-18 MED ORDER — NALOXONE HCL 4 MG/10ML IJ SOLN
1.0000 ug/kg/h | INTRAVENOUS | Status: DC | PRN
  Filled 2020-09-18: qty 5

## 2020-09-18 MED ORDER — OXYCODONE HCL 5 MG PO TABS: 5.0000 mg | ORAL_TABLET | Freq: Once | ORAL | Status: DC | PRN

## 2020-09-18 MED ORDER — SENNOSIDES-DOCUSATE SODIUM 8.6-50 MG PO TABS
2.0000 | ORAL_TABLET | Freq: Every day | ORAL | Status: DC
  Administered 2020-09-19 – 2020-09-21 (×3): 2 via ORAL
  Filled 2020-09-18 (×3): qty 2

## 2020-09-18 NOTE — Anesthesia Procedure Notes (Addendum)
Spinal  Patient location during procedure: OR Start time: 09/18/2020 12:25 AM End time: 09/18/2020 12:30 AM Reason for block: surgical anesthesia Staffing Performed: anesthesiologist  Anesthesiologist: Mellody Dance, MD Preanesthetic Checklist Completed: patient identified, IV checked, risks and benefits discussed, surgical consent, monitors and equipment checked, pre-op evaluation and timeout performed Spinal Block Patient position: sitting Prep: DuraPrep Patient monitoring: cardiac monitor, continuous pulse ox and blood pressure Approach: midline Location: L3-4 Injection technique: single-shot Needle Needle type: Pencan  Needle gauge: 24 G Needle length: 9 cm Assessment Sensory level: T3 (bilateral) Events: CSF return Additional Notes Functioning IV was confirmed and monitors were applied. Sterile prep and drape, including hand hygiene and sterile gloves were used. The patient was positioned and the spine was prepped. The skin was anesthetized with lidocaine.  Free flow of clear CSF was obtained prior to injecting local anesthetic into the CSF.  The spinal needle aspirated freely following injection.  The needle was carefully withdrawn.  The patient tolerated the procedure well.

## 2020-09-18 NOTE — Anesthesia Postprocedure Evaluation (Signed)
Anesthesia Post Note  Patient: Alyssa Ray  Procedure(s) Performed: CESAREAN SECTION (N/A )     Patient location during evaluation: Mother Baby Anesthesia Type: Spinal Level of consciousness: oriented and awake and alert Pain management: pain level controlled Vital Signs Assessment: post-procedure vital signs reviewed and stable Respiratory status: spontaneous breathing and respiratory function stable Cardiovascular status: blood pressure returned to baseline and stable Postop Assessment: no headache, no backache, no apparent nausea or vomiting and able to ambulate Anesthetic complications: no   No complications documented.  Last Vitals:  Vitals:   09/18/20 0338 09/18/20 0435  BP: (!) 143/84 (!) 141/77  Pulse: (!) 57 (!) 58  Resp: 16 17  Temp: 36.9 C 37 C  SpO2: 98% 98%    Last Pain:  Vitals:   09/18/20 0435  TempSrc: Oral  PainSc:    Pain Goal: Patients Stated Pain Goal: 3 (09/16/20 2039)              Epidural/Spinal Function Cutaneous sensation: Normal sensation (09/18/20 0434), Patient able to flex knees: Yes (09/18/20 0434), Patient able to lift hips off bed: No (09/18/20 0434), Back pain beyond tenderness at insertion site: No (09/18/20 0434), Progressively worsening motor and/or sensory loss: No (09/18/20 0434), Bowel and/or bladder incontinence post epidural: No (09/18/20 0434)  Mellody Dance

## 2020-09-18 NOTE — Op Note (Addendum)
Alyssa Ray PROCEDURE DATE: 09/18/2020  PREOPERATIVE DIAGNOSES: Intrauterine pregnancy at [redacted]w[redacted]d weeks gestation; non reassuring fetal heart tones in the setting of CHTN and FGR  POSTOPERATIVE DIAGNOSES: The same  PROCEDURE: Primary Low Transverse Cesarean Section  SURGEON:  Dr. Nettie Elm  ASSISTANT:  Casper Harrison, OB Fellow   ANESTHESIOLOGY TEAM: Anesthesiologist: Mellody Dance, MD CRNA: Trellis Paganini, CRNA  INDICATIONS: Alyssa Ray is a 30 y.o. [phone] at [redacted]w[redacted]d here for cesarean section secondary to the indications listed under preoperative diagnoses; please see preoperative note for further details.  The risks of surgery were discussed with the patient including but were not limited to: bleeding which may require transfusion or reoperation; infection which may require antibiotics; injury to bowel, bladder, ureters or other surrounding organs; injury to the fetus; need for additional procedures including hysterectomy in the event of a life-threatening hemorrhage; formation of adhesions; placental abnormalities wth subsequent pregnancies; incisional problems; thromboembolic phenomenon and other postoperative/anesthesia complications.  The patient concurred with the proposed plan, giving informed written consent for the procedure.    FINDINGS:  Viable female infant in breech presentation.  Apgars and weight as recorded. Scant clear fluid.  Intact placenta, three vessel cord.  Normal uterus, fallopian tubes and ovaries bilaterally.  ANESTHESIA: Spinal INTRAVENOUS FLUIDS:As recorded ESTIMATED BLOOD LOSS: 300 ml URINE OUTPUT:  As recorded SPECIMENS: Placenta sent to pathology COMPLICATIONS: None immediate  PROCEDURE IN DETAIL:  The patient preoperatively received intravenous antibiotics and had sequential compression devices applied to her lower extremities.  She was then taken to the operating room where spinal anesthesia was administered and was found to be adequate. She was then  placed in a dorsal supine position with a leftward tilt, and prepped and draped in a sterile manner.  A foley catheter was placed into her bladder and attached to constant gravity.  After an adequate timeout was performed, a Pfannenstiel skin incision was made with scalpel and carried through to the underlying layer of fascia. The fascia was incised in the midline, and this incision was extended bilaterally using the Mayo scissors.  Kocher clamps were applied to the superior aspect of the fascial incision and the underlying rectus muscles were dissected off bluntly and sharply.  A similar process was carried out on the inferior aspect of the fascial incision. The rectus muscles were separated in the midline and the peritoneum was entered bluntly. The Alexis self-retaining retractor was introduced into the abdominal cavity.  Attention was turned to the lower uterine segment where a low transverse hysterotomy was made with a scalpel and extended bilaterally bluntly.  The infant was successfully delivered, the cord was clamped and cut, and the infant was handed over to the awaiting neonatology team. Uterine massage was then administered, and the placenta delivered intact with a three-vessel cord. The uterus was then cleared of clots and debris.  The hysterotomy was closed with 0 Vicryl in a running locked fashion, and an imbricating layer was also placed with 0 Vicryl.   The pelvis was cleared of all clot and debris. Hemostasis was confirmed on all surfaces.  The retractor was removed.  The  rectus muscles were reapproximated using 0 Vicryl interrupted stitches. The fascia was then closed using 0 Vicryl in a running fashion.  The subcutaneous layer was irrigated, reapproximated with 2/0 Vicryl  interrupted stitches, and the skin was closed with a 4-0 Vicryl subcuticular stitch. Provena was applied.The patient tolerated the procedure well. Sponge, instrument and needle counts were correct x 3.  She  was taken to the  recovery room in stable condition.    Nettie Elm, MD, FACOG Obstetrician & Gynecologist, [facility] for [facility], [facility]

## 2020-09-18 NOTE — Transfer of Care (Signed)
Immediate Anesthesia Transfer of Care Note  Patient: Alyssa Ray  Procedure(s) Performed: CESAREAN SECTION (N/A )  Patient Location: PACU  Anesthesia Type:Spinal  Level of Consciousness: awake, alert  and patient cooperative  Airway & Oxygen Therapy: Patient Spontanous Breathing  Post-op Assessment: Report given to RN and Post -op Vital signs reviewed and stable  Post vital signs: Reviewed and stable  Last Vitals:  Vitals Value Taken Time  BP 140/86 09/18/20 0200  Temp 37.1 C 09/18/20 0155  Pulse 54 09/18/20 0201  Resp 18 09/18/20 0201  SpO2 100 % 09/18/20 0201  Vitals shown include unvalidated device data.  Last Pain:  Vitals:   09/18/20 0155  TempSrc: Oral  PainSc:       Patients Stated Pain Goal: 3 (09/16/20 2039)  Complications: No complications documented.

## 2020-09-18 NOTE — Lactation Note (Signed)
This note was copied from a baby's chart. Lactation Consultation Note  Patient Name: Alyssa Ray [id number] Date: 09/18/2020 Reason for consult: Follow-up assessment;Primapara;1st time breastfeeding;Infant < 6lbs;NICU baby;Preterm <34wks Age:29 hours   LC in to visit with P1 Mom.  Mom had just pumped, and said she saw drops.  Reviewed pumping every 2-3 hrs during the day when awake and 3-4 hrs at night.  Mom pumping one breast at a time due to IV in right hand.  LC provided an elastic band for a hand's free pump.  Demonstrated how to properly use it.  Mom has large diameter nipples.  24 mm flanges appear to be a good fit but Mom instructed to change to 27 mm if nipple starts rubbing along the flange.  Mom denies any discomfort currently.  Reviewed disassembling pump parts and demonstrated washing, rinsing and air drying in separate bin provided.  Mom does not have a pump at home.  [facility] referral faxed.  Mom aware of DEBP in baby's room in the NICU available for her use.  Lactation Tools Discussed/Used Tools: Pump;Flanges Flange Size: 24 Breast pump type: Double-Electric Breast Pump Pump Education: Setup, frequency, and cleaning;Milk Storage Reason for Pumping: Support milk supply/preterm infant in NICU Pumping frequency: Q3h Pumped volume: 0 mL (drops)  Discharge Pump: DEBP WIC Program: Yes  Consult Status Consult Status: Follow-up Date: 09/19/20 Follow-up type: In-patient    [name] 09/18/2020, 10:36 AM

## 2020-09-18 NOTE — Lactation Note (Signed)
This note was copied from a baby's chart. Lactation Consultation Note  Patient Name: Alyssa Ray [id number] Date: 09/18/2020 Reason for consult: Initial assessment;Infant < 6lbs;Primapara;1st time breastfeeding;NICU baby;Preterm <34wks Age:29 hours   P1 mother whose infant is now 64 hours old.  This is a preterm baby at 28+2 weeks weighing < 2 lbs and in the NICU.  Mother was trying to rest when I entered.  She had started pumping with the DEBP as of 0600 this morning.  She had no questions/concerns related to pumping at this time.  Denies pain with pumping.  RN assisted.    Encouraged to pump every 2 1/2-3 hours to help stimulate breasts.  Discussed the importance of saving any EBM she obtains for baby.  Informed her that she may pump at baby's bedside in the nursery and this may help her milk supply.  She will obtain labels from the NICU.    Mom made aware of O/P services, breastfeeding support groups, community resources, and our phone # for post-discharge questions. Provided "The NICU and Your Baby" booklet and asked mother to call today for any questions/concerns.   Maternal Data Does the patient have breastfeeding experience prior to this delivery?: No  Feeding Mother's Current Feeding Choice: Breast Milk  LATCH Score                    Lactation Tools Discussed/Used    Interventions    Discharge    Consult Status Consult Status: Follow-up Date: 09/19/20 Follow-up type: In-patient    Jonas Goh R Othal Kubitz 09/18/2020, 7:14 AM

## 2020-09-18 NOTE — Discharge Instructions (Signed)
Cesarean Delivery, Care After This sheet gives you information about how to care for yourself after your procedure. Your health care provider may also give you more specific instructions. If you have problems or questions, contact your health care provider. What can I expect after the procedure? After the procedure, it is common to have:  A small amount of blood or clear fluid coming from the incision.  Some redness, swelling, and pain in your incision area.  Some abdominal pain and soreness.  Vaginal bleeding (lochia). Even though you did not have a vaginal delivery, you will still have vaginal bleeding and discharge.  Pelvic cramps.  Fatigue. You may have pain, swelling, and discomfort in the tissue between your vagina and your anus (perineum) if:  Your C-section was unplanned, and you were allowed to labor and push.  An incision was made in the area (episiotomy) or the tissue tore during attempted vaginal delivery. Follow these instructions at home: Incision care  Follow instructions from your health care provider about how to take care of your incision. Make sure you: ? Wash your hands with soap and water before you change your bandage (dressing). If soap and water are not available, use hand sanitizer. ? If you have a dressing, change it or remove it as told by your health care provider. ? Leave stitches (sutures), skin staples, skin glue, or adhesive strips in place. These skin closures may need to stay in place for 2 weeks or longer. If adhesive strip edges start to loosen and curl up, you may trim the loose edges. Do not remove adhesive strips completely unless your health care provider tells you to do that.  Check your incision area every day for signs of infection. Check for: ? More redness, swelling, or pain. ? More fluid or blood. ? Warmth. ? Pus or a bad smell.  Do not take baths, swim, or use a hot tub until your health care provider says it's okay. Ask your health  care provider if you can take showers.  When you cough or sneeze, hug a pillow. This helps with pain and decreases the chance of your incision opening up (dehiscing). Do this until your incision heals.   Medicines  Take over-the-counter and prescription medicines only as told by your health care provider.  If you were prescribed an antibiotic medicine, take it as told by your health care provider. Do not stop taking the antibiotic even if you start to feel better.  Do not drive or use heavy machinery while taking prescription pain medicine. Lifestyle  Do not drink alcohol. This is especially important if you are breastfeeding or taking pain medicine.  Do not use any products that contain nicotine or tobacco, such as cigarettes, e-cigarettes, and chewing tobacco. If you need help quitting, ask your health care provider. Eating and drinking  Drink at least 8 eight-ounce glasses of water every day unless told not to by your health care provider. If you breastfeed, you may need to drink even more water.  Eat high-fiber foods every day. These foods may help prevent or relieve constipation. High-fiber foods include: ? Whole grain cereals and breads. ? Brown rice. ? Beans. ? Fresh fruits and vegetables. Activity  If possible, have someone help you care for your baby and help with household activities for at least a few days after you leave the hospital.  Return to your normal activities as told by your health care provider. Ask your health care provider what activities are safe for   you.  Rest as much as possible. Try to rest or take a nap while your baby is sleeping.  Do not lift anything that is heavier than 10 lbs (4.5 kg), or the limit that you were told, until your health care provider says that it is safe.  Talk with your health care provider about when you can engage in sexual activity. This may depend on your: ? Risk of infection. ? How fast you heal. ? Comfort and desire to  engage in sexual activity.   General instructions  Do not use tampons or douches until your health care provider approves.  Wear loose, comfortable clothing and a supportive and well-fitting bra.  Keep your perineum clean and dry. Wipe from front to back when you use the toilet.  If you pass a blood clot, save it and call your health care provider to discuss. Do not flush blood clots down the toilet before you get instructions from your health care provider.  Keep all follow-up visits for you and your baby as told by your health care provider. This is important. Contact a health care provider if:  You have: ? A fever. ? Bad-smelling vaginal discharge. ? Pus or a bad smell coming from your incision. ? Difficulty or pain when urinating. ? A sudden increase or decrease in the frequency of your bowel movements. ? More redness, swelling, or pain around your incision. ? More fluid or blood coming from your incision. ? A rash. ? Nausea. ? Little or no interest in activities you used to enjoy. ? Questions about caring for yourself or your baby.  Your incision feels warm to the touch.  Your breasts turn red or become painful or hard.  You feel unusually sad or worried.  You vomit.  You pass a blood clot from your vagina.  You urinate more than usual.  You are dizzy or light-headed. Get help right away if:  You have: ? Pain that does not go away or get better with medicine. ? Chest pain. ? Difficulty breathing. ? Blurred vision or spots in your vision. ? Thoughts about hurting yourself or your baby. ? New pain in your abdomen or in one of your legs. ? A severe headache.  You faint.  You bleed from your vagina so much that you fill more than one sanitary pad in one hour. Bleeding should not be heavier than your heaviest period. Summary  After the procedure, it is common to have pain at your incision site, abdominal cramping, and slight bleeding from your vagina.  Check  your incision area every day for signs of infection.  Tell your health care provider about any unusual symptoms.  Keep all follow-up visits for you and your baby as told by your health care provider. This information is not intended to replace advice given to you by your health care provider. Make sure you discuss any questions you have with your health care provider. Document Revised: 12/10/2017 Document Reviewed: 12/10/2017 Elsevier Patient Education  2021 Elsevier Inc.  

## 2020-09-18 NOTE — Discharge Summary (Signed)
Postpartum Discharge Summary  Date of Service updated 09/21/2020      Patient Name: Alyssa Ray DOB: 1990-07-04 MRN: [id number]  Date of admission: 09/11/2020 Delivery date:09/18/2020  Delivering provider: Chancy Milroy  Date of discharge: 09/21/2020  Admitting diagnosis: Chronic hypertension affecting pregnancy [O10.919] Poor fetal growth [P05.9] Intrauterine pregnancy: [redacted]w[redacted]d    Secondary diagnosis:  Active Problems:   Chronic hypertension affecting pregnancy   Poor fetal growth   COVID-19 affecting pregnancy in second trimester  Additional problems: NA    Discharge diagnosis: Preterm Pregnancy Delivered, CHTN and FGR                                              Post partum procedures:none Complications: None  Hospital course: admitted for monitoring with FGR, AEDF. Alyssa Mirelesis a 30y.o. G2P0010 at 29w2ddmitted for chronic HTN with elevated blood pressure.  Pt was seen by MFM this am and noted to have BPs in the severe range CS was done due to repetitive decelerations 09/18/20 Magnesium Sulfate received: No BMZ received: Yes Rhophylac:No MMR:No T-DaP:Given prenatally Flu: No Transfusion:No  Physical exam  Vitals:   09/20/20 1939 09/20/20 2255 09/20/20 2256 09/21/20 0438  BP: 136/70  (!) 156/75 116/63  Pulse: 79  78 69  Resp: _0 Temp: 98.4 F (36.9 C)  97.8 F (36.6 C) 97.9 F (36.6 C)  TempSrc: Oral  Oral Oral  SpO2: 100% 98% 99% 100%  Weight:      Height:       General: alert, cooperative and no distress Lochia: appropriate Uterine Fundus: firm Incision: Wound vac operating normally DVT Evaluation: No evidence of DVT seen on physical exam. Labs: Lab Results  Component Value Date   WBC 20.9 (H) 09/18/2020   HGB 12.2 09/18/2020   HCT 37.8 09/18/2020   MCV 81.5 09/18/2020   PLT 233 09/18/2020   CMP Latest Ref Rng & Units 09/18/2020  Glucose 70 - 99 mg/dL -  BUN 6 - 20 mg/dL -  Creatinine 0.44 - 1.00 mg/dL 0.59  Sodium 135 - 145  mmol/L -  Potassium 3.5 - 5.1 mmol/L -  Chloride 98 - 111 mmol/L -  CO2 22 - 32 mmol/L -  Calcium 8.9 - 10.3 mg/dL -  Total Protein 6.5 - 8.1 g/dL -  Total Bilirubin 0.3 - 1.2 mg/dL -  Alkaline Phos 38 - 126 U/L -  AST 15 - 41 U/L -  ALT 0 - 44 U/L -   Edinburgh Score: Edinburgh Postnatal Depression Scale Screening Tool 09/18/2020  I have been able to laugh and see the funny side of things. 0  I have looked forward with enjoyment to things. 0  I have blamed myself unnecessarily when things went wrong. 2  I have been anxious or worried for no good reason. 2  I have felt scared or panicky for no good reason. 1  Things have been getting on top of me. 2  I have been so unhappy that I have had difficulty sleeping. 0  I have felt sad or miserable. 1  I have been so unhappy that I have been crying. 1  The thought of harming myself has occurred to me. 0  Edinburgh Postnatal Depression Scale Total 9     After visit meds:  Allergies as of 09/21/2020  Reactions   Hydrocodone-acetaminophen Nausea And Vomiting   Nickel Rash   Tramadol Itching   "Makes me itch all over"      Medication List    STOP taking these medications   acetaminophen 500 MG tablet Commonly known as: TYLENOL   aspirin EC 81 MG tablet   cefadroxil 500 MG capsule Commonly known as: DURICEF   famotidine 20 MG tablet Commonly known as: Pepcid     TAKE these medications   Blood Pressure Kit Devi 1 Device by Does not apply route as needed.   ibuprofen 800 MG tablet Commonly known as: ADVIL Take 1 tablet (800 mg total) by mouth 3 (three) times daily.   NIFEdipine 30 MG 24 hr tablet Commonly known as: ADALAT CC Take 1 tablet (30 mg total) by mouth daily.   oxyCODONE 5 MG immediate release tablet Commonly known as: Oxy IR/ROXICODONE Take 1-2 tablets (5-10 mg total) by mouth every 4 (four) hours as needed for severe pain.   prenatal multivitamin Tabs tablet Take 1 tablet by mouth daily at 12 noon.    sertraline 50 MG tablet Commonly known as: ZOLOFT Take 2 tablets (100 mg total) by mouth daily. What changed: how much to take        Discharge home in stable condition Infant Feeding: Breast Infant Disposition:NICU Discharge instruction: per After Visit Summary and Postpartum booklet. Activity: Advance as tolerated. Pelvic rest for 6 weeks.  Diet: routine diet Future Appointments: Future Appointments  Date Time Provider [place]  10/23/2020  8:35 AM Aletha Halim, MD [facility]  01/03/2021  2:00 PM Just, Laurita Quint, FNP PCP-PCP [facility]   Follow up Visit:  [place] for [place] at [facility] Follow up.   Specialty: Obstetrics and Gynecology Why: Wound vac incision BP Contact information: [place] [phone]               Please schedule this patient for a In person postpartum visit in 2-3 days with the following provider: Any provider. Additional Postpartum F/U:Postpartum Depression checkup  High risk pregnancy complicated by: HTN and FGR Delivery mode:  C-Section, Low Transverse  Anticipated Birth Control:  POPs   09/21/2020 Emeterio Reeve, MD

## 2020-09-18 NOTE — Consult Note (Signed)
Neonatology Note:   Attendance at C-section:    I was asked by Dr. Alysia Penna to attend this C/S at 28 2/[redacted] weeks EGA due to decels. The mother is a [id number], with good prenatal care complicated by [facility], elevated BPs and aEDBF. AROM at delivery, fluid clear. Infant not vigorous nor with good spontaneous cry and tone. Cord immediately cut and baby brought to ISR and placed on warming mattress with cover.  HR >100.  No resp effort and poor tone.  SaO2 placed. PPV initiated with coughs and gradual spontaneous respirations.  To cpap at ~72minutes of life with adjustment of fio2 as indicated. Good cry, activity, fio2 down to mid 20s on CPAP 6cm.  Lungs coarse. Pink.  Ap 6/9.  Family updated. Uneventful transfer to NICU due to prematurity and RDS; FOB accompanied [place] and updated re general management plan.    Dineen Kid Leary Roca, MD

## 2020-09-19 ENCOUNTER — Encounter: Payer: Medicaid Other | Admitting: Family Medicine

## 2020-09-19 ENCOUNTER — Encounter (HOSPITAL_COMMUNITY): Payer: Self-pay | Admitting: Obstetrics and Gynecology

## 2020-09-19 ENCOUNTER — Other Ambulatory Visit: Payer: Medicaid Other

## 2020-09-19 NOTE — Progress Notes (Signed)
POSTPARTUM PROGRESS NOTE  POD #1  Subjective:  Alyssa Ray is a 30 y.o. [id number] s/p pLTCS at [redacted]w[redacted]d. Today she notes that she is doing ok. She denies any problems with ambulating, voiding or po intake. Denies nausea or vomiting. She has + flatus, no BM.  Pain is well controlled.  Lochia minimal Denies fever/chills/chest pain/SOB.  no HA, no blurry vision, noRUQ pain  Objective: Blood pressure 133/85, pulse 70, temperature 98.7 F (37.1 C), temperature source Oral, resp. rate 18, height 5\' 4"  (1.626 m), weight 136.1 kg, last menstrual period 03/04/2020, SpO2 99 %, unknown if currently breastfeeding.  Physical Exam:  General: alert, cooperative and no distress Chest: no respiratory distress Heart:regular rate, distal pulses intact Abdomen: obese, soft, nontender, +BS Uterine Fundus: firm, appropriately tender Incision: prevena dressing in place- clean and dry DVT Evaluation: No calf swelling or tenderness Extremities: 1+ edema Skin: warm, dry  Assessment/Plan: Alyssa Ray is a 30 y.o. G2P0111 s/p pLTCS at [redacted]w[redacted]d POD#1 complicated by: 1) cHTN -BP well controlled with procardiaXL 30mg  daily  2) Anxiety -mood appropriate, currently on zoloft 100mg  daily  Encourage ambulation as tolerated Continue with routine postop care Lovenox for DVT prophylaxis  Contraception: POPs Feeding: baby boy in NICU  Dispo: Continue with postop care as outlined above   LOS: 8 days   [redacted]w[redacted]d, DO Faculty Attending, [facility] 09/19/2020, 12:01 PM

## 2020-09-19 NOTE — Clinical Social Work Maternal (Signed)
CLINICAL SOCIAL WORK MATERNAL/CHILD NOTE  Patient Details  Name: Alyssa Ray MRN: [id number] Date of Birth: 13-Jan-1991  Date:  09/19/2020  Clinical Social Worker Initiating Note:  Laurey Arrow Date/Time: Initiated:  09/19/20/1156     Child's Name:  Alyssa Ray   Biological Parents:  Mother,Father (MOB has a hx of anxiety adn bipolor disorder)   Need for Interpreter:  None   Reason for Referral:  [place] of Premature Babies < 32 weeks/or Critically Ill babies   Address:  [place] [address]    Phone number:  [phone] (home)     Additional phone number: FOB's number is [phone]  Household Members/Support Persons (HM/SP):   Household Member/Support Person 1   HM/SP Name Relationship DOB or Age  HM/SP -1 Percell Miller "Eddie" Goldman FOB 09/22/1996  HM/SP -2        HM/SP -3        HM/SP -4        HM/SP -5        HM/SP -6        HM/SP -7        HM/SP -8          Natural Supports (not living in the home):  Extended Family,Friends,Immediate Family,Parent (Per MOB and FOB , FOB's family will also provide supports when needed.)   Professional Supports: Oceanographer (MOB receives therapy at [facility] in [place] with Dr. Marland KitchenA." MOB is also an active participant with [facility].)   Employment: Unemployed   Type of Work:     Education:  Programmer, systems   Homebound arranged:    Museum/gallery curator Resources:  Medicaid   Other Resources:  Physicist, medical ,[place] Considerations Which May Impact Care:  Per [employer], MOB is Engineer, manufacturing.   Strengths:  Ability to meet basic needs ,Home prepared for child ,Compliance with medical plan ,Understanding of illness,Psychotropic Medications (CSW provided the couple with a list of local pediatricians.)   Psychotropic Medications:  Zoloft      Pediatrician:       Pediatrician List:   [facility]      Pediatrician Fax Number:    Risk Factors/Current Problems:  Mental Health Concerns    Cognitive State:  Alert ,Insightful ,Linear Thinking    Mood/Affect:  Comfortable ,Happy ,Bright ,Relaxed    CSW Assessment: CSW met with MOB and FOB at infant's bedside in room 107. When CSW arrived, MOB was sitting in a wheel chair next to infant's bed; MOB was touching infant. FOB was also present and was sitting in the recliner reviewing papers. CSW explained CSW's role and MOB gave CSW permission to complete the assessment while FOB was present.  FOB agreed to step out when CSW is ready to assess MOB in private about her MH. The couple appeared supportive of one another and was receptive to meeting with CSW.  They were also polite and easy to engage.   MOB shared her birthing story and she appears to have a good understanding about infant's health.  The couple shared feeling well informed by the NICU team and understood the necessity for MOB to deliver earlier.   CSW made the family aware that infant is eligible for SSI benefits. CSW reviewed benefits and explained how to initiate application. The family denied  having any questions or concerns and was encouraged to reach out to CSW if any questions arise.  CSW also reviewed NICU visitation policy and the couple agreed that FOB's mother will be the additional support person.   CSW asked FOB to leave the room in order to assess MOB's MH hx.; FOB left without incident. MOB acknowledged a hx of anxiety and bipolar disorder and reported she was dx at age 89.  Per MOB she has tried numerous medication since her dx however she is currently taking Zoloft.  MOB communicated that her dosage has increased since being inpatient and she feels like her symptoms are managed. CSW provided education regarding the baby blues period vs. perinatal mood disorders, discussed treatment and  gave resources for mental health follow up if concerns arise.  CSW recommends self-evaluation during the postpartum time period using the New Mom Checklist from Postpartum Progress and encouraged MOB to contact a medical professional if symptoms are noted at any time.  MOB presented with insight and awareness and did not display any acute MH symptoms. MOB reported having a good support team and expressed feeling comfortable seeking help if needed.  MOB also plans to continue her monthly sessions with her therapist at [facility].   CSW provided review of Sudden Infant Death Syndrome (SIDS) precautions.    The couple request meal vouchers and CSW agreed to provide meal voucher after MOB discharges.   CSW will continue to offer resources and supports to family while infant remains in NICU.    CSW Plan/Description:  Psychosocial Support and Ongoing Assessment of Needs,Sudden Infant Death Syndrome (SIDS) Education,Perinatal Mood and Anxiety Disorder (PMADs) Education,Other Patient/Family Education,Other Information/Referral to [employer], MSW, [employer] Social Work [phone]  Dimple Nanas, LCSW 09/19/2020, 12:00 PM

## 2020-09-19 NOTE — Lactation Note (Signed)
This note was copied from a baby's chart. Lactation Consultation Note  Patient Name: Boy Christana Angelica ZOXWR'U Date: 09/19/2020 Reason for consult: NICU baby;Follow-up assessment Age:29 hours  Maternal Data  Mom has pumped 2x today. Reviewed normal pumping volume on day 2 of life. Encouraged mom to increase pumping frequency.   Feeding Mother's Current Feeding Choice: Breast Milk and Donor Milk  Interventions Interventions: Education  Consult Status Consult Status: Follow-up Follow-up type: In-patient   Elder Negus, MA IBCLC 09/19/2020, 4:01 PM

## 2020-09-20 ENCOUNTER — Encounter: Payer: Medicaid Other | Admitting: Obstetrics and Gynecology

## 2020-09-20 ENCOUNTER — Other Ambulatory Visit: Payer: Medicaid Other

## 2020-09-20 LAB — SURGICAL PATHOLOGY

## 2020-09-20 MED ORDER — FAMOTIDINE 20 MG PO TABS
20.0000 mg | ORAL_TABLET | Freq: Two times a day (BID) | ORAL | Status: DC
  Administered 2020-09-20 – 2020-09-21 (×3): 20 mg via ORAL
  Filled 2020-09-20 (×3): qty 1

## 2020-09-20 NOTE — Lactation Note (Signed)
Lactation Consultation Note  Patient Name: Alyssa Ray DEYCX'K Date: 09/20/2020 Reason for consult: Follow-up assessment Age:30 y.o.   LC Follow Up Visit:  RN requested lactation consult for [facility] pump information.  Per LC note from earlier today I noticed that mother has received a visit.  I spoke with mother and she informed me that the [facility] representative has spoken with her and she has no further questions/concerns at this time.  Mother may call as needed for assistance.   Maternal Data    Feeding    LATCH Score                    Lactation Tools Discussed/Used    Interventions    Discharge    Consult Status Consult Status: Follow-up Date: 09/21/20 Follow-up type: In-patient    Akua Blethen R Roddrick Sharron 09/20/2020, 2:15 PM

## 2020-09-20 NOTE — Progress Notes (Signed)
POSTPARTUM PROGRESS NOTE  POD #2  Subjective:  Alyssa Ray is a 30 y.o. [id number] s/p pLTCS at [redacted]w[redacted]d. She reports that she is doing better today.  Notes some discomfort/soreness overnight from lying in the same position, but overall doing well.  Pt ambulating, voiding and tolerating po. Denies nausea or vomiting. She has + flatus, no BM.  Pain is well controlled.  Lochia minimal Denies fever/chills/chest pain/SOB.  no HA, no blurry vision, noRUQ pain  Objective: Blood pressure 129/69, pulse 70, temperature 98.5 F (36.9 C), temperature source Oral, resp. rate 18, height 5\' 4"  (1.626 m), weight 136.1 kg, last menstrual period 03/04/2020, SpO2 100 %, unknown if currently breastfeeding.  Physical Exam:  General: alert, cooperative and no distress Chest: no respiratory distress Heart:regular rate, distal pulses intact Abdomen: obese, soft, nontender, +BS Uterine Fundus: firm, appropriately tender Incision: prevena dressing in place- clean and dry DVT Evaluation: No calf swelling or tenderness Extremities: no edema Skin: warm, dry  Assessment/Plan: Alyssa Ray is a 30 y.o. G2P0111 s/p pLTCS at [redacted]w[redacted]d POD#2 complicated by: 1) cHTN -BP well controlled with procardiaXL 30mg  daily  2) Anxiety -mood appropriate, currently on zoloft 100mg  daily  Encourage ambulation as tolerated Continue with routine postop care Prevena in place Lovenox for DVT prophylaxis  Contraception: POPs Feeding: baby boy in NICU  Dispo: Continue with postop care as outlined above, plan for discharge home tomorrow   LOS: 9 days   [redacted]w[redacted]d, DO Faculty Attending, Center for [facility] 09/20/2020, 10:30 AM

## 2020-09-20 NOTE — Lactation Note (Addendum)
This note was copied from a baby's chart. Lactation Consultation Note  Patient Name: Alyssa Ray CVELF'Y Date: 09/20/2020 Reason for consult: Follow-up assessment;NICU baby;Preterm <34wks;Infant < 6lbs Age:29 hours   P1, Baby CGA [redacted]w[redacted]d.  Mother excited because she pumped 20 ml last night and 9 ml today. Mother feels her breasts are starting to fill.  Discussed ebb and flow of milk volume.  Praised her for her efforts.   Mother has spoken to [facility] regarding her loaner and states she plans to also purchase hands free DEBP. Encouraged consistency with pumping.    Maternal Data  cHTN   Lactation Tools Discussed/Used Tools: Pump Breast pump type: Double-Electric Breast Pump Reason for Pumping: maternal separation from infant/NICU Pumping frequency: q 3 hours Pumped volume: 9 mL (20 ml last night)  Interventions Interventions: DEBP;Education  Discharge  [facility] loaner  Consult Status Consult Status: Follow-up Date: 09/21/20 Follow-up type: In-patient    Dahlia Byes [facility] 09/20/2020, 10:17 AM

## 2020-09-21 ENCOUNTER — Other Ambulatory Visit: Payer: Self-pay

## 2020-09-21 MED ORDER — SERTRALINE HCL 50 MG PO TABS: 100.0000 mg | ORAL_TABLET | Freq: Every day | ORAL | 1 refills | Status: DC

## 2020-09-21 MED ORDER — OXYCODONE HCL 5 MG PO TABS: 5.0000 mg | ORAL_TABLET | ORAL | 0 refills | Status: DC | PRN

## 2020-09-21 MED ORDER — NIFEDIPINE ER 30 MG PO TB24: 30.0000 mg | ORAL_TABLET | Freq: Every day | ORAL | 1 refills | Status: DC

## 2020-09-21 MED ORDER — IBUPROFEN 800 MG PO TABS: 800.0000 mg | ORAL_TABLET | Freq: Three times a day (TID) | ORAL | 0 refills | Status: DC

## 2020-09-21 NOTE — Progress Notes (Signed)
At 1025 provided pt with d/c instructions and  educated on babyscripts (pt was already enrolled, however we did complete a BP together). Pt verbalized understanding of all d/c instructions. Pt alert and oriented, pain tolerable, and pt was wheeled to NICU by SO at discharge.

## 2020-09-21 NOTE — Lactation Note (Signed)
This note was copied from a baby's chart. Lactation Consultation Note  Patient Name: Alyssa Ray EYEMV'V Date: 09/21/2020 Reason for consult: Follow-up assessment;Infant < 6lbs;NICU baby;Preterm <34wks;1st time breastfeeding;Primapara Age:29 hours   LC in to visit with P1 Mom of preterm infant in the NICU.  Mom has been consistently pumping every 3 hrs during the day and 4 hrs at night.  Volume pumped has increased to 120 ml over night.    Mom states that [facility] gave her an appointment on 4/12 to receive a pump.  LC called Tree surgeon at [facility] and left message inquiring about this and requesting Mom receive her DEBP today.  Mom is aware of DEBP in baby's room for her to use.   Mom to use the maintenance setting now that her volume exceeds 20 ml with pumping.  Mom to pump for 15-30 mins   Mom aware of lactation support available to her and encouraged her to ask her baby's RN to contact LC prn.   Lactation Tools Discussed/Used Tools: Pump Breast pump type: Double-Electric Breast Pump Pumping frequency: Q3-4 hrs Pumped volume: 120 mL  Interventions Interventions: Breast feeding basics reviewed;Education;Breast massage;Hand express;Pre-pump if needed;DEBP;Skin to skin  Discharge Discharge Education: Engorgement and breast care  Consult Status Consult Status: Follow-up Date: 09/22/20 Follow-up type: In-patient    [name] 09/21/2020, 8:14 AM

## 2020-09-21 NOTE — Plan of Care (Signed)
  Problem: Education: Goal: Knowledge of General Education information will improve Description: Including pain rating scale, medication(s)/side effects and non-pharmacologic comfort measures Outcome: Adequate for Discharge   Problem: Health Behavior/Discharge Planning: Goal: Ability to manage health-related needs will improve Outcome: Adequate for Discharge   Problem: Clinical Measurements: Goal: Ability to maintain clinical measurements within normal limits will improve Outcome: Adequate for Discharge Goal: Will remain free from infection Outcome: Adequate for Discharge Goal: Diagnostic test results will improve Outcome: Adequate for Discharge Goal: Respiratory complications will improve Outcome: Adequate for Discharge Goal: Cardiovascular complication will be avoided Outcome: Adequate for Discharge   Problem: Activity: Goal: Risk for activity intolerance will decrease Outcome: Adequate for Discharge   Problem: Nutrition: Goal: Adequate nutrition will be maintained Outcome: Adequate for Discharge   Problem: Coping: Goal: Level of anxiety will decrease Outcome: Adequate for Discharge   Problem: Elimination: Goal: Will not experience complications related to bowel motility Outcome: Adequate for Discharge Goal: Will not experience complications related to urinary retention Outcome: Adequate for Discharge   Problem: Pain Managment: Goal: General experience of comfort will improve Outcome: Adequate for Discharge   Problem: Safety: Goal: Ability to remain free from injury will improve Outcome: Adequate for Discharge   Problem: Skin Integrity: Goal: Risk for impaired skin integrity will decrease Outcome: Adequate for Discharge   Problem: Education: Goal: Knowledge of disease or condition will improve Outcome: Adequate for Discharge Goal: Knowledge of the prescribed therapeutic regimen will improve Outcome: Adequate for Discharge Goal: Individualized Educational  Video(s) Outcome: Adequate for Discharge   Problem: Clinical Measurements: Goal: Complications related to the disease process, condition or treatment will be avoided or minimized Outcome: Adequate for Discharge   Problem: Education: Goal: Knowledge of disease or condition will improve Outcome: Adequate for Discharge Goal: Knowledge of the prescribed therapeutic regimen will improve Outcome: Adequate for Discharge   Problem: Fluid Volume: Goal: Peripheral tissue perfusion will improve Outcome: Adequate for Discharge   Problem: Clinical Measurements: Goal: Complications related to disease process, condition or treatment will be avoided or minimized Outcome: Adequate for Discharge   Problem: Education: Goal: Knowledge of condition will improve Outcome: Adequate for Discharge Goal: Individualized Educational Video(s) Outcome: Adequate for Discharge Goal: Individualized Newborn Educational Video(s) Outcome: Adequate for Discharge   Problem: Activity: Goal: Will verbalize the importance of balancing activity with adequate rest periods Outcome: Adequate for Discharge Goal: Ability to tolerate increased activity will improve Outcome: Adequate for Discharge   Problem: Coping: Goal: Ability to identify and utilize available resources and services will improve Outcome: Adequate for Discharge   Problem: Life Cycle: Goal: Chance of risk for complications during the postpartum period will decrease Outcome: Adequate for Discharge   Problem: Role Relationship: Goal: Ability to demonstrate positive interaction with newborn will improve Outcome: Adequate for Discharge   Problem: Skin Integrity: Goal: Demonstration of wound healing without infection will improve Outcome: Adequate for Discharge

## 2020-09-25 ENCOUNTER — Ambulatory Visit (INDEPENDENT_AMBULATORY_CARE_PROVIDER_SITE_OTHER): Payer: Medicaid Other | Admitting: Lactation Services

## 2020-09-25 ENCOUNTER — Encounter: Payer: Self-pay | Admitting: Lactation Services

## 2020-09-25 DIAGNOSIS — Z4889 Encounter for other specified surgical aftercare: Secondary | ICD-10-CM

## 2020-09-25 NOTE — Progress Notes (Signed)
Patient here post c/s on 4/4. She reports infant is in the NICU. Patient's significant other present and very supportive.   Patient reports pain is manageable with medications. She is resting when she can.   Patient's Provena Plus was removed. Incision clean, dry and intact. No redness, swelling or drainage noted. Patient tolerated removal well. Reviewed cleaning of incision sight and keeping area near incision clean and dry throughout the day.   Patient to follow up in Office on 5/9. Patient with no questions or concerns at this time.

## 2020-09-25 NOTE — Progress Notes (Signed)
Patient was assessed and managed by nursing staff during this encounter. I have reviewed the chart and agree with the documentation and plan. I have also made any necessary editorial changes.  Jaynie Collins, MD 09/25/2020 3:41 PM

## 2020-09-26 ENCOUNTER — Ambulatory Visit: Payer: Medicaid Other

## 2020-09-28 ENCOUNTER — Other Ambulatory Visit: Payer: Self-pay

## 2020-10-01 ENCOUNTER — Ambulatory Visit: Payer: Self-pay

## 2020-10-01 NOTE — Lactation Note (Signed)
This note was copied from a baby's chart. Lactation Consultation Note  Patient Name: Boy Delaina Fetsch JYNWG'N Date: 10/01/2020   Age:29 days   LC just missed Mom as she was leaving from rooming-in with baby all night.  RN states Mom has an abundant milk supply and has been discarding EBM due to "having too much".   Education needed on milk storage guidelines.  RN to call [facility] when Mom arrives back to NICU.   [name] 10/01/2020, 10:22 AM

## 2020-10-02 ENCOUNTER — Ambulatory Visit: Payer: Self-pay

## 2020-10-02 NOTE — Lactation Note (Signed)
This note was copied from a baby's chart. Lactation Consultation Note  Patient Name: Alyssa Ray Date: 10/02/2020 Reason for consult: Follow-up assessment;Mother's request;Primapara;1st time breastfeeding;NICU baby;Preterm <34wks;Infant < 6lbs Age:29 wk.o.  Lactation followed up with [name] and 81 week old [name] ([redacted]w[redacted]d PMA). We discussed her progress with breast pumping, and I answered questions about milk storage. [name] has been discarding some milk due to lack of storage options. I just brought her more storage bottles and indicated that she should request them. She is freezing some of her EBM using milk storage bags; milk lab has reached capacity for now. I recommended that she could also freeze some EBM in the storage bottles for when milk lab is ready for more (to have some frozen expressed milk to bring). She is going to reach out to family members to try to find access to more freezer space.  I recommended that she pump every 2-3 hours during the day and every 3-4 hours at night. I recommended that she post pump after holding baby STS. She has a Mom Cozy pump and a Symphony pump. She has realized that the Mom Cozy pump does not have as good suction. I recommended using the Mom Cozy as a backup, but to primarily use the Symphony at home and at the NICU.  She has been averaging 7 pumps a day with sufficient milk production. We discussed her milk production goals, and I encouraged her to try to pump a bit more frequently (every 2 hours during the day) since she is taking four hour stretches at night. She has noticed some decline in production this week and attributes this to stress.  We discussed finding a balance in doing the things for baby in NICU and being a new mom. We discussed proper hydration and rest and to take some time for self-care.  Lactation to follow up in one week unless [name] prior to that time.   Maternal Data Does the patient have breastfeeding  experience prior to this delivery?: No  Feeding Mother's Current Feeding Choice: Breast Milk  Lactation Tools Discussed/Used Breast pump type: Double-Electric Breast Pump;Other (comment) (Mom Cozy) Pump Education: Setup, frequency, and cleaning;Milk Storage Reason for Pumping: NICU Pumping frequency: 7/day Pumped volume: 150 mL  Interventions Interventions: Breast feeding basics reviewed;Education  Discharge Pump: DEBP  Consult Status Consult Status: Follow-up Date: 10/09/20 Follow-up type: In-patient    Walker Shadow 10/02/2020, 9:55 AM

## 2020-10-05 ENCOUNTER — Other Ambulatory Visit: Payer: Self-pay

## 2020-10-12 ENCOUNTER — Other Ambulatory Visit: Payer: Self-pay

## 2020-10-14 ENCOUNTER — Ambulatory Visit: Payer: Self-pay

## 2020-10-14 NOTE — Lactation Note (Signed)
This note was copied from a baby's chart. Lactation Consultation Note LC to room for weekly visit. Mother continues to pump without difficulty. We reviewed IDF that potentially begins around 34 weeks according to infant status. Will plan f/u next week.  Patient Name: Alyssa Ray WPVXY'I Date: 10/14/2020 Reason for consult: NICU baby;Follow-up assessment Age:29 wk.o.  Maternal Data  Mother with abundant supply. She pumps q3 during the day and q4 at night. Her average pumping is approx. .  Feeding Mother's Current Feeding Choice: Breast Milk  Interventions Interventions: Skin to skin   Consult Status Consult Status: Follow-up Follow-up type: In-patient   Elder Negus, MA IBCLC 10/14/2020, 11:22 AM

## 2020-10-16 ENCOUNTER — Ambulatory Visit: Payer: Self-pay

## 2020-10-16 NOTE — Lactation Note (Signed)
This note was copied from a baby's chart. Lactation Consultation Note  Patient Name: Alyssa Ray DQQIW'L Date: 10/16/2020   Age:29 wk.o. Telephone call from mom.  Mom reports she is using the wrong size flange.  Mom reports she read a bunch of stuff on line and really feels like she needs a 30 mm flange on one of her nipples. Mom reports using nipple cream when she pumps.  Using lanolin Lansinoh cream.   Mom reports one of her nipples is very red and swollen and has white on the tip.  Mom reports her nipple is the size of a quarter.  Gave mom one 30 mm flange to use on that breast.  Urged mom to d/c lanolin and switch back to the coconut oil she was using in the hospital. Discussed to have pumping observed.  Mom reports she is spending the night and would like to have someone come in the am.    Maternal Data    Feeding    LATCH Score                    Lactation Tools Discussed/Used    Interventions    Discharge    Consult Status      Neomia Dear 10/16/2020, 8:23 PM

## 2020-10-17 ENCOUNTER — Ambulatory Visit: Payer: Self-pay

## 2020-10-17 NOTE — Lactation Note (Addendum)
This note was copied from a baby's chart. Lactation Consultation Note  Patient Name: Alyssa Ray NMMHW'K Date: 10/17/2020 Reason for consult: Follow-up assessment;Nipple pain/trauma;NICU baby;Preterm <34wks;Other (Comment) (Brief visit to assess mom pumping both breast - #63F Left breast ( appears borderline snug - per mom comfortable) and on the right breast #27 F per mom comfortable. LC recommended when pumping to allow breast to hang naturally for better fitting flange.) Age:59 wk.o. Mom has coconut oil at bedside. LC recommended EBM 1st to her nipples , and when pumping a dab of coconut oil to decrease the friction as preventive.  Maternal Data    Feeding Mother's Current Feeding Choice: Breast Milk  LATCH Score                    Lactation Tools Discussed/Used Tools: Pump Flange Size: 27;30 Breast pump type: Double-Electric Breast Pump Pumped volume:  (mom still pumping / more volume on the the left than right)  Interventions    Discharge    Consult Status Consult Status: Follow-up Date: 10/18/20 Follow-up type: In-patient    Alyssa Ray 10/17/2020, 8:12 AM

## 2020-10-19 ENCOUNTER — Other Ambulatory Visit: Payer: Self-pay

## 2020-10-22 ENCOUNTER — Ambulatory Visit: Payer: Self-pay

## 2020-10-22 NOTE — Lactation Note (Signed)
This note was copied from a baby's chart. Lactation Consultation Note LC to room for f/u visit. Mom continues to pump 6-8 x day with 150-180 mls per pumping. We reviewed IDF plan. Mom is aware of LC services. Will plan f/u visit.   Patient Name: Alyssa Ray HOOIL'N Date: 10/22/2020 Reason for consult: NICU baby;Follow-up assessment Age:29 wk.o.  Feeding Mother's Current Feeding Choice: Breast Milk   Discharge Discharge Education: Engorgement and breast care  Consult Status Consult Status: Follow-up Follow-up type: In-patient   Elder Negus, MA IBCLC 10/22/2020, 5:44 PM

## 2020-10-23 ENCOUNTER — Ambulatory Visit: Payer: Self-pay | Admitting: Obstetrics and Gynecology

## 2020-10-23 ENCOUNTER — Encounter: Payer: Self-pay | Admitting: Obstetrics and Gynecology

## 2020-10-25 NOTE — Progress Notes (Signed)
Patient did not keep her postpartum appointment for 10/23/2020.  Cornelia Copa MD Attending Center for [employer] Midwife)

## 2020-10-26 ENCOUNTER — Other Ambulatory Visit: Payer: Self-pay

## 2020-10-31 ENCOUNTER — Ambulatory Visit: Payer: Self-pay

## 2020-10-31 NOTE — Lactation Note (Addendum)
This note was copied from a baby's chart. Lactation Consultation Note  Patient Name: Alyssa Ray ZOXWR'U Date: 10/31/2020 Reason for consult: Follow-up assessment;Mother's request;NICU baby;Late-preterm 34-36.6wks;Infant < 6lbs;1st time breastfeeding;Primapara Age:29 wk.o.  LC in to visit with P1 Mom of baby AGA [redacted]w[redacted]d.  Mom is inquiring about starting to breastfeed [name].  Baby currently sleeping in isolette with gavage feeding going.  Mom states she just pumped a full pumping.  LC offered to help place baby STS.  Mom wanted to wait for baby's RN.  Mom wanting help with latching baby to the breast.  Appt made for 5/19 at 0900.  Instructed Mom to pump 20-30 mins prior to feeding to "empty breast" due to large milk supply. Talked about placing baby over her breast after hand expressing a drop onto nipple.  Mom requested assistance from lactation.    Encouraged STS as much as possible and consistent pumping.  Mom to observe baby for feeding cues and notify baby's RN.  Reviewed developmental readiness normal preterm feeding behavior.  If Mom would like assistance tomorrow, Mom to ask RN to call LC.   Lactation Tools Discussed/Used Tools: Pump;Flanges Flange Size: 30 Breast pump type: Double-Electric Breast Pump Pumping frequency: Q 3 hrs Pumped volume: 180 mL  Interventions Interventions: Skin to skin;Breast massage;Hand express;Pre-pump if needed;DEBP;Education  Consult Status Consult Status: Follow-up Date: 11/02/20 Follow-up type: In-patient    Alyssa Ray 10/31/2020, 2:07 PM

## 2020-11-02 ENCOUNTER — Other Ambulatory Visit (HOSPITAL_COMMUNITY)
Admission: RE | Admit: 2020-11-02 | Discharge: 2020-11-02 | Disposition: A | Discharge status: Home / Self Care | Payer: Medicaid Other | Source: Ambulatory Visit | Attending: Obstetrics and Gynecology | Admitting: Obstetrics and Gynecology

## 2020-11-02 ENCOUNTER — Other Ambulatory Visit: Payer: Self-pay

## 2020-11-02 ENCOUNTER — Ambulatory Visit: Payer: Self-pay

## 2020-11-02 ENCOUNTER — Ambulatory Visit (INDEPENDENT_AMBULATORY_CARE_PROVIDER_SITE_OTHER): Payer: Medicaid Other | Admitting: Obstetrics and Gynecology

## 2020-11-02 ENCOUNTER — Encounter: Payer: Self-pay | Admitting: Obstetrics and Gynecology

## 2020-11-02 VITALS — BP 128/90 | HR 77 | Wt 278.8 lb

## 2020-11-02 DIAGNOSIS — Z124 Encounter for screening for malignant neoplasm of cervix: Secondary | ICD-10-CM

## 2020-11-02 MED ORDER — MICONAZOLE NITRATE 2 % VA CREA: 1.0000 | TOPICAL_CREAM | Freq: Every day | VAGINAL | 2 refills | Status: DC

## 2020-11-02 NOTE — Progress Notes (Signed)
    Post Partum Visit Note  Alyssa Ray is a 30 y.o. [phone] who presents for a postpartum visit s/p 4/4 pLTCS at 28wks for non reassuring tracing in the setting of FGR. Pregnancy c/b cHTN.  I have fully reviewed the prenatal and intrapartum course. Anesthesia: spinal. Postpartum course has been uncomplicated. Baby is doing well and latched a little bit for the first time today. Baby is feeding by both breast and bottle - baby in NICU; she states she is pumping q3h. Bleeding no bleeding and she think she may have had her period about two weeks ago. Bowel function is normal. Bladder function is normal. Patient is sexually active and notes some discomfort with insertion. Contraception method is none. Postpartum depression screening: negative.     Edinburgh Postnatal Depression Scale - 11/02/20 1328      Edinburgh Postnatal Depression Scale:  In the Past 7 Days   I have been able to laugh and see the funny side of things. 0    I have looked forward with enjoyment to things. 0    I have blamed myself unnecessarily when things went wrong. 2    I have been anxious or worried for no good reason. 0    I have felt scared or panicky for no good reason. 0    Things have been getting on top of me. 3    I have been so unhappy that I have had difficulty sleeping. 0    I have felt sad or miserable. 0    I have been so unhappy that I have been crying. 1    The thought of harming myself has occurred to me. 0    Edinburgh Postnatal Depression Scale Total 6           Health Maintenance Due  Topic Date Due  . COVID-19 Vaccine (1) Never done  . PAP-Cervical Cytology Screening  Never done  . PAP SMEAR-Modifier  Never done    Review of Systems Pertinent items noted in HPI and remainder of comprehensive ROS otherwise negative.  Objective:  BP 128/90   Pulse 77   Wt 278 lb 12.8 oz (126.5 kg)   LMP 03/04/2020   Breastfeeding Yes   BMI 47.86 kg/m    NAD Abdomen: soft, nttp, c/d/i  incision EGBUS normal Vagina: mild, atrophy, no VB. ?rare cottage cheese like vag d/c Cervix: nttp, normal Uterus: small, nttp  Assessment:   Normal pp exam.   Plan:   *Postpartum: pap smear done today. Birth control options d/w her. She desires a LARC and didn't like nexplanon in the past and would like a period. I d/w her re: Paragard and brochure given. Pelvic exam c/w her pumping frequency and I encouraged lubrication use and I recommend monistat   *cHTN: patient hasn't been on procardia for past few weeks. Patient on no meds previously. F/u in 30m   RTC: 2 months for annual exam  Pima Bing, MD [facility] for [facility], [facility]

## 2020-11-02 NOTE — Lactation Note (Signed)
This note was copied from a baby's chart. Lactation Consultation Note  Patient Name: Alyssa Ray VBTYO'M Date: 11/02/2020 Reason for consult: Follow-up assessment;NICU baby;1st time breastfeeding;Primapara;Late-preterm 34-36.6wks;Infant < 6lbs Age:29 wk.o.  LC in to assist/assess with first attempt at the breast.   Baby quiet and alert.  No feeding cues until he was placed STS on Mom's chest as she was reclined back in chair.  Baby started showing early cues, tongue extending out of mouth.    Mom pre-pumped (5 oz)   Rolled cloth placed under right breast to support breast.  Positioned baby in football hold.  Hand expressed drop of milk onto nipple.  Baby opened his mouth and extended his tongue to lick.  LC initiated a 20 mm nipple (showing Mom how to invert prior to placing onto nipple)  Nipple pulled well into shield.  After several attempts, baby opened his mouth enough to latch onto base of nipple.  Helped to flange upper and lower lips. Mom assisted with supporting breast and supporting baby's head from ear to ear.  Baby sucking with jaw extensions noted.  One swallow identified, milk noted in shield when baby came off.  No signs of stress and baby paced himself on the breast before becoming sleepy.    Baby fed with shallow latch onto nipple base, for 9 minutes.  Mom assisted in burping baby sitting upright and then placed him STS on her chest for remainder of gavage feeding.  Belly band provided to STS with baby.   F/U with lactation 5/21 at 0900  LATCH Score Latch: Repeated attempts needed to sustain latch, nipple held in mouth throughout feeding, stimulation needed to elicit sucking reflex.  Audible Swallowing: A few with stimulation  Type of Nipple: Everted at rest and after stimulation  Comfort (Breast/Nipple): Soft / non-tender  Hold (Positioning): Assistance needed to correctly position infant at breast and maintain latch.  LATCH Score: 7   Lactation Tools  Discussed/Used Tools: Pump;Nipple Shields Nipple shield size: 20 Breast pump type: Double-Electric Breast Pump   Consult Status Consult Status: Follow-up Date: 11/03/20 Follow-up type: In-patient    [name] 11/02/2020, 9:44 AM

## 2020-11-03 ENCOUNTER — Ambulatory Visit: Payer: Self-pay

## 2020-11-03 LAB — CYTOLOGY - PAP: Diagnosis: NEGATIVE

## 2020-11-03 NOTE — Lactation Note (Signed)
This note was copied from a baby's chart. Lactation Consultation Note LC to room for bf assistance. Mother pre-pumped. Baby received full gavage while practicing bf'ing. Baby suckled at pumped breast for 27 minutes with some occasional swallows. +milk was visible in shield. Baby self-paced and did not demonstrate stress during session. He came off breast independently and appeared satisfied. Mother was pleased. Will plan f/u visit to assist further tomorrow at 0900, per maternal request.   Patient Name: Alyssa Ray KPVVZ'S Date: 11/03/2020 Reason for consult: NICU baby;Follow-up assessment Age:37 wk.o.  Feeding Mother's Current Feeding Choice: Breast Milk  LATCH Score Latch: Grasps breast easily, tongue down, lips flanged, rhythmical sucking.  Audible Swallowing: A few with stimulation  Type of Nipple: Everted at rest and after stimulation  Comfort (Breast/Nipple): Soft / non-tender  Hold (Positioning): Assistance needed to correctly position infant at breast and maintain latch.  LATCH Score: 8   Lactation Tools Discussed/Used Pumping frequency: q3 Pumped volume: 270 mL  Interventions Interventions: Breast feeding basics reviewed;Support pillows;Education;Assisted with latch;Position options   Consult Status Consult Status: Follow-up Follow-up type: In-patient   Elder Negus, MA IBCLC 11/03/2020, 11:59 AM

## 2020-11-04 ENCOUNTER — Ambulatory Visit: Payer: Self-pay

## 2020-11-04 NOTE — Lactation Note (Signed)
This note was copied from a baby's chart. Lactation Consultation Note LC to room to assist with bf. Mom is able to position and latch with minimum assistance. Baby continues to go to pumped breast. Baby was awake and latched with mostly non-nutritive suckling pattern for 7 minutes. LC left mom and baby sts. Mom plans to increase frequency of bf'ing at touch times. She will continue to pre-pump at this time. LC will plan f/u visits to continue supporting breastfeeding advancement.   Patient Name: Alyssa Ray SWNIO'E Date: 11/04/2020 Reason for consult: NICU baby;Follow-up assessment Age:31 wk.o.   Feeding Mother's Current Feeding Choice: Breast Milk  LATCH Score Latch: Repeated attempts needed to sustain latch, nipple held in mouth throughout feeding, stimulation needed to elicit sucking reflex.  Audible Swallowing: None  Type of Nipple: Everted at rest and after stimulation  Comfort (Breast/Nipple): Soft / non-tender  Hold (Positioning): Assistance needed to correctly position infant at breast and maintain latch.  LATCH Score: 6   Lactation Tools Discussed/Used  20 mm shield  Interventions Interventions: Education;Support pillows;Breast feeding basics reviewed;Assisted with latch;Position options;Skin to skin;Expressed milk  Consult Status Consult Status: Follow-up Follow-up type: In-patient   Elder Negus, MA IBCLC 11/04/2020, 9:16 AM

## 2020-11-05 ENCOUNTER — Ambulatory Visit: Payer: Self-pay

## 2020-11-05 NOTE — Lactation Note (Signed)
This note was copied from a baby's chart. Lactation Consultation Note  Patient Name: Alyssa Ray WYOVZ'C Date: 11/05/2020 Reason for consult: Follow-up assessment;Mother's request;NICU baby;Preterm <34wks;1st time breastfeeding Age:29 wk.o.  [name] requested lactation to assist with the 1500 feeding. I entered and assisted with placing a size 20 nipple shield on the left breast. [name]' anatomy would work well with a size 24, but baby is still quite small.  At this feeding baby did not latch. He held the nipple in his mouth with no root reflex. [name] took him off the breast and held him at the breast during his gavage feeding, upon which we observed some occasional straining that indicated baby may be trying to stool.  [name] continues to pump strong volumes and to pre-pump when she puts baby to breast.  I encouraged her to call lactation for follow up as needed.   Maternal Data Does the patient have breastfeeding experience prior to this delivery?: No  Feeding Mother's Current Feeding Choice: Breast Milk  LATCH Score Latch: Too sleepy or reluctant, no latch achieved, no sucking elicited.  Audible Swallowing: None  Type of Nipple: Everted at rest and after stimulation  Comfort (Breast/Nipple): Soft / non-tender  Hold (Positioning): No assistance needed to correctly position infant at breast.  LATCH Score: 6   Lactation Tools Discussed/Used Tools: Nipple Shields Nipple shield size: 20;Other (comment) (maternal anatomy would do well with a 24) Breast pump type: Double-Electric Breast Pump Pump Education: Setup, frequency, and cleaning Reason for Pumping: NICU Pumping frequency: q3 Pumped volume: 250 mL (mls - varies - up to 10 ounces a pump)  Interventions Interventions: Breast feeding basics reviewed;Skin to skin;Hand express;Support pillows;Education  Discharge    Consult Status Consult Status: Follow-up Date: 11/10/20 Follow-up type:  In-patient    [name] 11/05/2020, 3:23 PM

## 2020-11-06 ENCOUNTER — Ambulatory Visit: Payer: Self-pay

## 2020-11-06 NOTE — Lactation Note (Signed)
This note was copied from a baby's chart. Lactation Consultation Note  Patient Name: Alyssa Ray MVHQI'O Date: 11/06/2020 Reason for consult: Follow-up assessment;Mother's request;NICU baby;Late-preterm 34-36.6wks;Infant < 6lbs;1st time breastfeeding;Primapara Age:29 wk.o.   LC requested to assist with positioning baby in football hold on right breast.  Mom pre-pumped 15 mins for 90 ml.    Baby actively showing subtle cueing to eat.   Initiated 20 mm nipple shield, nipple pulled well into shield. Baby latched to base of nipple after a few attempts. Encouraged Mom to support her breast, compressing during sucking to help with transfer of milk. Baby sucked consistently, pacing himself well.  Baby non-nutritive for most of feeding, identified 3 swallows.  Baby relaxed without any stress cues.    When baby came off the breast, no milk noted in shield, but nipple pulled more into shield after feeding.  Mom does really well talking gently to baby during feeding.   Mom desires assistance tomorrow, appt made for 5/24 at 0900  Encouraged Mom to pump consistently to support her milk supply.   LATCH Score Latch: Grasps breast easily, tongue down, lips flanged, rhythmical sucking.  Audible Swallowing: A few with stimulation  Type of Nipple: Everted at rest and after stimulation  Comfort (Breast/Nipple): Soft / non-tender  Hold (Positioning): Assistance needed to correctly position infant at breast and maintain latch.  LATCH Score: 8   Lactation Tools Discussed/Used Tools: Pump;Flanges Pumping frequency: Q 3hr-6 hrs at night Pumped volume: 150 mL (300 ml first pump in am)  Interventions Interventions: Assisted with latch;Breast massage;Skin to skin;Pre-pump if needed;Breast compression;Adjust position;Support pillows;Position options;DEBP;Education  Consult Status Consult Status: Follow-up Date: 11/07/20 Follow-up type: In-patient    [name] 11/06/2020, 9:27  AM

## 2020-11-07 ENCOUNTER — Ambulatory Visit: Payer: Self-pay

## 2020-11-07 NOTE — Lactation Note (Signed)
This note was copied from a baby's chart. Lactation Consultation Note LC to room for 0900 bf'ing. Mother has abundant milk supply and has been pre-pumping. For this feeding, mother reduced time spent pumping and removed about 40% of the available milk. Baby latched easily and bf for 8 minutes. He self-paced and showed no obvious stress;  RR did increase above 70's for some of the feeding. SLP in room for end of feeding. We reviewed s/s stress during bf and advancing IDF. LC will return prn to assist further.   Patient Name: Alyssa Ray XAJOI'N Date: 11/07/2020 Reason for consult: NICU baby;Follow-up assessment;Other (Comment) (observed bf'ing) Age:86 wk.o.  Maternal Data  Mother with abundant supply. Usual pumpings are 150-330mls.  Feeding Mother's Current Feeding Choice: Breast Milk  LATCH Score Latch: Grasps breast easily, tongue down, lips flanged, rhythmical sucking.  Audible Swallowing: Spontaneous and intermittent  Type of Nipple: Everted at rest and after stimulation  Comfort (Breast/Nipple): Soft / non-tender  Hold (Positioning): Assistance needed to correctly position infant at breast and maintain latch.  LATCH Score: 9   Lactation Tools Discussed/Used  18mm shield  Interventions Interventions: Assisted with latch;Education   Consult Status Consult Status: Follow-up Date: 11/08/20 (0900) Follow-up type: In-patient   Elder Negus, MA IBCLC 11/07/2020, 9:45 AM

## 2020-11-08 ENCOUNTER — Ambulatory Visit: Payer: Self-pay

## 2020-11-08 NOTE — Lactation Note (Signed)
This note was copied from a baby's chart. Lactation Consultation Note LC to infant's room for observed bf'ing. Mother and baby began feeding prior to [facility] arrival. Per mom's recall, infant remained at breast for 14 minutes. He continues to fluctuate between nutritive and no-nutritive suckling with intermittent audible swallows. He self-paces and does not show obvious signs of stress with bf'ing. Mother pumped briefly before this feeding. She has an abundant supply. It is likely that transfers milk even with little effort. Will plan f/u to further assess.   Patient Name: Alyssa Ray VFIEP'P Date: 11/08/2020 Reason for consult: NICU baby;Follow-up assessment;Mother's request;Other (Comment) (observed bf'ing) Age:56 wk.o.   Feeding Mother's Current Feeding Choice: Breast Milk   Consult Status Consult Status: Follow-up Follow-up type: In-patient   Elder Negus, MA IBCLC 11/08/2020, 9:54 AM

## 2020-11-09 ENCOUNTER — Other Ambulatory Visit: Payer: Self-pay

## 2020-11-10 ENCOUNTER — Ambulatory Visit: Payer: Self-pay

## 2020-11-10 NOTE — Lactation Note (Addendum)
This note was copied from a baby's chart. Lactation Consultation Note LC to room for observed bf'ing at half-pumped breast. No changes observed from last visit. Infant continues to remain primarily in non-nutritive state with occasional swallows. RR tend to increase with swallows. Mom encouraged to discontinue breast compressions during feeding. Reviewed normalcy of feeding behaviors and importance of breastfeeding practice. Baby remained at breast for about 20 minutes. He remains in IDF-1. LC will plan f/u visit next week to further assess and assist.   Patient Name: Alyssa Ray SKAJG'O Date: 11/10/2020 Reason for consult: NICU baby;Follow-up assessment;Other (Comment) (observed bf'ing) Age:30 y.o.  Maternal Data  changed flange on L side to 22mm.  Feeding Mother's Current Feeding Choice: Breast Milk  LATCH Score Latch: Repeated attempts needed to sustain latch, nipple held in mouth throughout feeding, stimulation needed to elicit sucking reflex.  Audible Swallowing: A few with stimulation  Type of Nipple: Everted at rest and after stimulation  Comfort (Breast/Nipple): Soft / non-tender  Hold (Positioning): Full assist, staff holds infant at breast  LATCH Score: 6  Interventions Interventions: Education;DEBP  Consult Status Consult Status: Follow-up Follow-up type: In-patient Monday @ 0900  Elder Negus, [place] IBCLC 11/10/2020, 10:13 AM

## 2020-11-11 ENCOUNTER — Ambulatory Visit: Payer: Self-pay

## 2020-11-11 NOTE — Lactation Note (Signed)
This note was copied from a baby's chart. Lactation Consultation Note  Patient Name: Boy Ruberta Holck YCXKG'Y Date: 11/11/2020 Reason for consult: Follow-up assessment;Primapara;1st time breastfeeding;NICU baby;Late-preterm 34-36.6wks;Infant < 6lbs Age:29 wk.o.   LC in to assist/assess positioning and latching to breast.  Mom pre-pumped for 6 minutes and expressed 90 ml.  Assisted with positioning baby in football hold on left breast.   Baby latched with a 20 mm nipple shield eagerly.  Baby paced himself well and sucked with identified swallowing.  Mom's nipple pulled further into shield with milk in tip at end of 15 mins.  Baby became sleepier after 10 mins, but continued with longer pauses for 5 more minutes.   Encouraged Mom to finish her pumping after the feeding.  Talked to Mom about foremilk and hindmilk.  Due to Mom's large milk supply, recommended she pump 1/2 amount of milk and label as foremilk (which is lower in calorie and fat) and store at home for when baby is >8 lbs.         F/U with lactation 5/30 at 0900.                        LATCH Score Latch: Grasps breast easily, tongue down, lips flanged, rhythmical sucking.  Audible Swallowing: A few with stimulation  Type of Nipple: Everted at rest and after stimulation  Comfort (Breast/Nipple): Soft / non-tender  Hold (Positioning): Assistance needed to correctly position infant at breast and maintain latch.  LATCH Score: 8   Lactation Tools Discussed/Used Tools: Flanges;Pump;Nipple Shields Nipple shield size: 20 Flange Size: 24;27 Breast pump type: Double-Electric Breast Pump Pumping frequency: Q3hrs Pumped volume: 180 mL  Interventions Interventions: Assisted with latch;Skin to skin;Breast massage;Hand express;Pre-pump if needed;Breast compression;Adjust position;Support pillows;Position options;DEBP;Education   Consult Status Consult Status: Follow-up Date: 11/13/20 Follow-up type: In-patient    [name] 11/11/2020, 3:13 PM

## 2020-11-13 ENCOUNTER — Ambulatory Visit: Payer: Self-pay

## 2020-11-13 NOTE — Lactation Note (Signed)
This note was copied from a baby's chart. Lactation Consultation Note  Patient Name: Alyssa Ray EUMPN'T Date: 11/13/2020 Reason for consult: Follow-up assessment;Primapara;1st time breastfeeding;NICU baby;Late-preterm 34-36.6wks;Infant < 6lbs Age:29 wk.o.   Baby gaining consistently.  Not using algorithm yet for supplementation.  Mom putting baby to breast 1-2 times per day.  LC in to assist/assess positioning and latch to breast. Mom pre-pumped 120 ml in 6 mins. Baby positioned in football hold on right breast.      Assisted Mom to try to latch [name] without nipple shield.  Baby latched when areola sandwiched, but he was unable to sustain depth on the breast. Initiated the 20 mm nipple shield.  Rolled up cloth under breast for support.  Mom to support her breast and baby's head. Baby latched onto nipple, tugged onto chin to open his mouth wider, but unable to sustain a deep areolar grasp.  Baby latched onto base of nipple, with flanged lips.  Baby consistently sucked needing little stimulation to continue sucking.  Occasional swallow identified.  Breast compression demonstrated to increase milk transfer.   Baby tired after 10 mins.  Nipple pulled further into shield, milk in shield.   Baby placed STS on Mom's chest for sleep and gavage feeding.  Lactation to F/U on 6/1 at 12 noon.   LATCH Score Latch: Grasps breast easily, tongue down, lips flanged, rhythmical sucking.  Audible Swallowing: A few with stimulation  Type of Nipple: Everted at rest and after stimulation  Comfort (Breast/Nipple): Soft / non-tender  Hold (Positioning): Assistance needed to correctly position infant at breast and maintain latch.  LATCH Score: 8   Lactation Tools Discussed/Used Tools: Nipple Shields;Pump;Flanges Nipple shield size: 20 Flange Size: 24 Breast pump type: Double-Electric Breast Pump  Interventions Interventions: Breast feeding basics reviewed;Assisted with latch;Skin to  skin;Breast massage;Hand express;Pre-pump if needed;Position options;Adjust position;Support pillows;Breast compression;DEBP;Education Consult Status Consult Status: Follow-up Date: 11/15/20 Follow-up type: In-patient    [name] 11/13/2020, 9:37 AM

## 2020-11-15 ENCOUNTER — Ambulatory Visit: Payer: Self-pay

## 2020-11-15 NOTE — Lactation Note (Signed)
This note was copied from a baby's chart. Lactation Consultation Note Breastfeeding skills have improved from last week. LC observed 30 minute feeding with rhythmic suckling bursts and audible swallowing. Continued occasional panting with elevated RR; baby took frequent breaks to return to normal respirations. Mother pre-pumped for 10 minutes but has abundant supply. Transfer of milk evident by audible swallows, maternal awareness of milk letdowns, and visible milk in shield p bf'ing. LC will plan f/u tomorrow to provide additional support.   Patient Name: Alyssa Ray HWEXH'B Date: 11/15/2020 Reason for consult: Other (Comment) (Observed breastfeeding) Age:29 wk.o.  Feeding Mother's Current Feeding Choice: Breast Milk  LATCH Score Latch: Grasps breast easily, tongue down, lips flanged, rhythmical sucking.  Audible Swallowing: Spontaneous and intermittent  Type of Nipple: Everted at rest and after stimulation  Comfort (Breast/Nipple): Soft / non-tender  Hold (Positioning): No assistance needed to correctly position infant at breast.  LATCH Score: 10   Lactation Tools Discussed/Used Tools: Nipple Shields Nipple shield size: 20  Interventions Interventions: Pre-pump if needed (prepumped )   Consult Status Consult Status: Follow-up Date: 11/16/20 Follow-up type: In-patient   Elder Negus, MA IBCLC 11/15/2020, 1:02 PM

## 2020-11-16 ENCOUNTER — Ambulatory Visit: Payer: Self-pay

## 2020-11-16 NOTE — Lactation Note (Signed)
This note was copied from a baby's chart. Lactation Consultation Note  Patient Name: Alyssa Ray ATFTD'D Date: 11/16/2020 Reason for consult: Follow-up assessment;NICU baby;Late-preterm 34-36.6wks Age:29 wk.o.   Mom has not been feeding infant on left side and did not pre pump prior to latching for this feeding.  NS size 20 placed and fit was comfortable for mom.  Mom has some scarring from previous piercing on left nipple on left side of nipple. NS 24 unavailable.  Infant latched easily in cross cradle hold with wide gape and lips flanged.  His body was relaxed during feeding.  Mom had to use breast compressions and other techniques to keep infant actively sucking and awake.  Mom had 3 "milk let downs" on left side.  Infant had continual rhythmic jaw movement and swallows were easily heard during let downs.  Shela Commons took appropriate pauses during the feed and showed no signs of distress at the breast.  Infant fed for 18 minutes on the left before falling asleep. Nipple was blanched after infant came off the breast and the left side of the nipple was slightly tender per mom.  LC encouraged mom to only use the 24 NS on the left side due to the widen base around the left nipple.  Mom may go purchase a size 24 and LC will continue to look for one in the hospital.   Mom placed him on the right side in football hold and he latched easily using the size 20 NS.  Multiple swallows were noted during the feeding. Cheeks were flush to breast and infant was alert for the feeding.  Infant fed another 10 minutes before falling asleep.  Nipple appeared rounded and mom denied any discomfort after the feeding.    Mom was excited about the feeding.  She continues to pump on average 8 times a day.  She collects ~7 oz from the right breast and ~3 from the left.  In the morning, her yield is greater: 10oz and 5 oz.    Lactation to follow up tomorrow at 3 pm.    Maternal Data    Feeding Mother's Current Feeding  Choice: Breast Milk  LATCH Score Latch: Grasps breast easily, tongue down, lips flanged, rhythmical sucking.  Audible Swallowing: Spontaneous and intermittent  Type of Nipple: Everted at rest and after stimulation  Comfort (Breast/Nipple): Soft / non-tender  Hold (Positioning): No assistance needed to correctly position infant at breast.  LATCH Score: 10   Lactation Tools Discussed/Used Tools: Pump;Nipple Shields Nipple shield size: 20 Flange Size: 24 Breast pump type: Double-Electric Breast Pump Reason for Pumping: NICU Pumping frequency: Q3 hrs  Interventions Interventions: Breast feeding basics reviewed;Support pillows;Skin to skin;Breast massage  Discharge Pump: DEBP  Consult Status Consult Status: Follow-up Date: 11/17/20 Follow-up type: In-patient    Maryruth Hancock [facility] 11/16/2020, 12:59 PM

## 2020-11-17 ENCOUNTER — Ambulatory Visit: Payer: Self-pay

## 2020-11-17 NOTE — Lactation Note (Signed)
This note was copied from a baby's chart. Lactation Consultation Note  Patient Name: Alyssa Ray MCNOB'S Date: 11/17/2020 Reason for consult: Follow-up assessment;NICU baby;1st time breastfeeding;Primapara;Late-preterm 34-36.6wks;Infant < 6lbs Age:29 wk.o.   Last day of 72 hr protected BFing.  Baby has gained 5-15 gm each night.  Baby has been consistently getting 1/3 of gavage using the algorithm.  A few full gavages and no breastfeeding.    Mom has an abundant milk supply and pre-pumped 1/2 pumping.  LC came in at 20 mins into feeding.  Mom reclined in chair with baby in modified cradle hold using a 20 mm nipple shield.  Mom not supporting her breast due to pain from carpal tunnel in both hands.   Mom exhausted due to spending 3 nights with baby.  Baby's head noted to be somewhat buried into breast.  LC adjusted baby's body to have baby's chin directly into breast and nose free of the breast.  Baby noted to be on base of nipple.  Baby noted to be actively sucking with regular swallows identified.  Nipple shield at times, moving in and out of baby's mouth.  Support under baby's body and encouraged Mom to support and sandwich her breast from the top (due to her painful wrists).    After 30 mins, took baby off the breast, milk noted in shield.  Baby showed no signs of stress and seemed contented after feeding.   Mom to finish her pumping after feeding.  F/U with lactation tomorrow at 12 noon.  Mom desires a ac/pc weight at that feeding.  LATCH Score Latch: Grasps breast easily, tongue down, lips flanged, rhythmical sucking.  Audible Swallowing: Spontaneous and intermittent  Type of Nipple: Everted at rest and after stimulation  Comfort (Breast/Nipple): Soft / non-tender  Hold (Positioning): Assistance needed to correctly position infant at breast and maintain latch.  LATCH Score: 9   Lactation Tools Discussed/Used Tools: Nipple Dorris Carnes;Pump Nipple shield size: 20 Breast pump  type: Double-Electric Breast Pump Pumping frequency: Q 3 hrs Pumped volume: 180 mL (180-300 ml)  Interventions Interventions: Skin to skin;Breast massage;Hand express;Pre-pump if needed;Breast compression;Adjust position;Support pillows;DEBP;Education  Consult Status Consult Status: Follow-up Date: 11/18/20 Follow-up type: In-patient    [name] 11/17/2020, 3:18 PM

## 2020-11-19 ENCOUNTER — Ambulatory Visit: Payer: Self-pay

## 2020-11-19 NOTE — Lactation Note (Signed)
This note was copied from a baby's chart. Lactation Consultation Note  Patient Name: Alyssa Ray NOTRR'N Date: 11/19/2020 Reason for consult: Follow-up assessment;Early term 37-38.6wks;Infant < 6lbs;NICU baby;Primapara;1st time breastfeeding Age:29 m.o.   LC in to visit with P1 Mom.  Baby STS sleeping on Mom's chest currently.  No cueing noted at this feeding.  Appointment rescheduled for tomorrow at 0900.  Baby was exclusively gavage fed last 24 hrs and gained 135 gms in one day.     Reassured Mom that baby gaining weight will only help him be a stronger breast feeder.  Mom has consistently been pumping every 3 hrs and supply remains good.  Offered to return at next feeding today, but Mom would like to wait until tomorrow.     Lactation Tools Discussed/Used Tools: Pump;Flanges Nipple shield size: 20 Flange Size: 24 Breast pump type: Double-Electric Breast Pump Pumping frequency: Q 3hrs Pumped volume: 180 mL  Consult Status Consult Status: Follow-up Date: 11/20/20 Follow-up type: In-patient    [name] 11/19/2020, 12:06 PM

## 2020-11-20 ENCOUNTER — Ambulatory Visit: Payer: Self-pay

## 2020-11-20 NOTE — Lactation Note (Signed)
This note was copied from a baby's chart. Lactation Consultation Note Infant appeared sleepy and required stimulation to continue. Intermittent, self-resolving panting and increased RR during feeding. Total time at breast was 14 minutes but only 2 rhythmic suckling bursts with audible swallows were observed. Infant removed . Reviewed normalcy for age and scheduled f/u visit.  Patient Name: Alyssa Ray [id number] Date: 11/20/2020 Reason for consult: Other (Comment) (AC/PC weighted feeding) Age:29 m.o.  Maternal Data  abundant supply prepumped at 7am  Pumped 41mL immediately prior to 900 bf'ing   Lactation Tools Discussed/Used Tools: Nipple Shields Nipple shield size: 20  Interventions Interventions: Breast massage;Support pillows (pestering)  Consult Status Consult Status: Follow-up Date: 11/21/20 (1200 repeat ac/pc) Follow-up type: In-patient   Elder Negus, MA IBCLC 11/20/2020, 9:47 AM

## 2020-11-21 ENCOUNTER — Ambulatory Visit: Payer: Self-pay

## 2020-11-21 NOTE — Lactation Note (Signed)
This note was copied from a baby's chart. Lactation Consultation Note  Patient Name: Alyssa Ray [id number] Date: 11/21/2020 Reason for consult: Follow-up assessment;Mother's request;Primapara;1st time breastfeeding;NICU baby;Preterm <34wks Age:29 m.o.  Lactation attended scheduled consult. Shela Commons was too sleepy to breast feed at this time. Ms. Beale attempted to latch, but he did not show any cues. We placed him on her chest, and he received his food via gavage.  Ms. Stice has decreased her pumping frequency in the last few days to q4-5 hours. She wanted to discuss how this will affect her milk supply. She is still producing strong milk volume, but she is sleeping through the night. We discussed tracking her daily pumping volumes several times a week and adjusting her pumping frequency and doing some power pumping if she notes any decrease in production. We also discussed baby's intake needs as he grows. She is very driven to breast feed and provide her milk, but states that this has been a busier week than usual.  All questions answered at this time. Ms. Koslowski will call for follow up as needed this week.   Maternal Data Has patient been taught Hand Expression?: Yes Does the patient have breastfeeding experience prior to this delivery?: No  Feeding Mother's Current Feeding Choice: Breast Milk  LATCH Score Latch: Too sleepy or reluctant, no latch achieved, no sucking elicited.  Audible Swallowing: None  Type of Nipple: Everted at rest and after stimulation  Comfort (Breast/Nipple): Soft / non-tender  Hold (Positioning): No assistance needed to correctly position infant at breast.  LATCH Score: 6   Lactation Tools Discussed/Used Tools: Nipple Shields Nipple shield size: 20 Breast pump type: Double-Electric Breast Pump Pump Education: Setup, frequency, and cleaning Reason for Pumping: support milk production Pumping frequency: q4-5 hours Pumped volume: 240 mL (varies; pumps  large volumes)  Interventions Interventions: Breast feeding basics reviewed;Skin to skin;Hand express;Support pillows;Adjust position;Education  Discharge    Consult Status Consult Status: Follow-up Date: 11/23/20 Follow-up type: In-patient    Walker Shadow 11/21/2020, 12:21 PM

## 2020-11-23 ENCOUNTER — Other Ambulatory Visit: Payer: Self-pay

## 2020-11-23 ENCOUNTER — Encounter: Payer: Self-pay | Admitting: Orthopedic Surgery

## 2020-11-23 ENCOUNTER — Ambulatory Visit (INDEPENDENT_AMBULATORY_CARE_PROVIDER_SITE_OTHER): Payer: Medicaid Other | Admitting: Physician Assistant

## 2020-11-23 ENCOUNTER — Ambulatory Visit: Payer: Self-pay

## 2020-11-23 DIAGNOSIS — M25572 Pain in left ankle and joints of left foot: Secondary | ICD-10-CM | POA: Diagnosis not present
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
## 2020-11-24 ENCOUNTER — Ambulatory Visit: Payer: Self-pay

## 2020-11-24 NOTE — Lactation Note (Signed)
This note was copied from a baby's chart. Lactation Consultation Note  Patient Name: Alyssa Ray [id number] Date: 11/24/2020 Reason for consult: Follow-up assessment;NICU baby Age:29 m.o.  RN called for latch assist.  Mom is breastfeeding from the right side.  She has too much difficulty with the size 20 on the left side.  She feels she needs a bigger NS.  She plans to get one from [employer].  She states the left makes 1/2 of what the right makes.  She is pumping 5-6 X daily. She collects 5-6oz.  per pump with the exception of the first pump in the morning when she collects 10oz.    Infant cueing to feed.  Mom tried football and cradle but infant finally latched well in Laid back position. NS 20 used.  Infant began rhythmically sucking, swallows easily heard.  Mom is able to feel her milk let down and shortly after loud swallowing noted with deep jaw excursions.  No signs of distress noted during the feeding.  Mom was elated infant was feeding so well.  Infant actively sucked with multiple swallows for 20 minutes before falling asleep.  Infant was content and arms relaxed.    Mom felt she needed this good feeding because she had previously been feeling things were getting more difficult as far as Jarmari feeding at the breast.      Maternal Data    Feeding Mother's Current Feeding Choice: Breast Milk  LATCH Score Latch: Grasps breast easily, tongue down, lips flanged, rhythmical sucking.  Audible Swallowing: Spontaneous and intermittent  Type of Nipple: Everted at rest and after stimulation  Comfort (Breast/Nipple): Soft / non-tender  Hold (Positioning): Assistance needed to correctly position infant at breast and maintain latch.  LATCH Score: 9   Lactation Tools Discussed/Used Nipple shield size: 20 Breast pump type: Double-Electric Breast Pump Reason for Pumping: stimulate milk supply Pumping frequency: 5-6 times daily Pumped volume: 180 mL (pump amt varies.  10 oz in  morning and 5-6 with out pumps)  Interventions Interventions: Breast feeding basics reviewed;Assisted with latch;Adjust position;Support pillows;Breast massage  Discharge Pump: DEBP  Consult Status Consult Status: Follow-up Date: 11/25/20 Follow-up type: In-patient    Maryruth Hancock [facility] 11/24/2020, 12:34 PM

## 2020-11-29 ENCOUNTER — Ambulatory Visit: Payer: Self-pay

## 2020-11-29 NOTE — Lactation Note (Signed)
This note was copied from a baby's chart. Lactation Consultation Note Observed 14 minute bf with rhythmic suckling and audible swallows. One desat at end of feeding likely because of a delayed swallow. Event self-resolved.   Patient Name: Alyssa Ray Date: 11/29/2020 Reason for consult: Other (Comment) (breastfeeding assessment) Age:29 m.o.  Maternal Data  Milk supply is wnl   Feeding Mother's Current Feeding Choice: Breast Milk  LATCH Score Latch: Grasps breast easily, tongue down, lips flanged, rhythmical sucking.  Audible Swallowing: Spontaneous and intermittent  Type of Nipple: Everted at rest and after stimulation  Comfort (Breast/Nipple): Soft / non-tender  Hold (Positioning): Assistance needed to correctly position infant at breast and maintain latch.  LATCH Score: 9   Lactation Tools Discussed/Used Pumping frequency: x6 Pumped volume: 120 mL   Consult Status Consult Status: Follow-up Follow-up type: In-patient   Elder Negus, MA IBCLC 11/29/2020, 4:02 PM

## 2020-12-03 ENCOUNTER — Ambulatory Visit: Payer: Self-pay

## 2020-12-03 NOTE — Lactation Note (Addendum)
This note was copied from a baby's chart. Lactation Consultation Note  Patient Name: Alyssa Ray [id number] Date: 12/03/2020 Reason for consult: Follow-up assessment;Term;Infant < 6lbs;NICU baby;1st time breastfeeding;Primapara Age:29 m.o.  LC in to visit with P1 Mom of Alyssa Ray.  AGA [redacted]w[redacted]d and last weight was 5 lbs 14.4 oz.  Shela Commons has been consistently gaining 1-2 oz per day by po (18% yesterday) and gavage feedings.  Mom breast fed at last feeding for 25 mins saying he was asleep throughout the feeding, but sucked.  Mom also states she obtained a 24 mm nipple shield from [facility], but it's too big for baby.  Mom would like an LC assist tomorrow at 0900, appt made.    Mom is still pumping consistently during the day, but sleeping all night.  Mom is interested in coming and staying with baby more often to work on bottles and breastfeeding.  Encouraged Mom to do this.  Mom asked about coconut oil for her areola as they are dry.     Lactation Tools Discussed/Used Tools: Pump Breast pump type: Double-Electric Breast Pump Pumping frequency: X 7 per day Pumped volume: 180 mL  Interventions Interventions: Coconut oil;DEBP;Education;Skin to skin   Consult Status Consult Status: Follow-up Date: 12/04/20 Follow-up type: In-patient    [name] 12/03/2020, 1:14 PM

## 2020-12-04 ENCOUNTER — Ambulatory Visit: Payer: Self-pay

## 2020-12-04 NOTE — Lactation Note (Signed)
This note was copied from a baby's chart. Lactation Consultation Note  Patient Name: Alyssa Ray [id number] Date: 12/04/2020 Reason for consult: Follow-up assessment;Infant < 6lbs;Term;NICU baby Age:29 m.o.  LC in to assist/assess with positioning baby to breastfeed. Mom has an abundant milk supply.  Mom did NOT pre-pump for this feeding.  Baby cueing and placed STS in cradle hold.  Watched Mom place rolled up cloth for support under her right breast.  Baby latched onto nipple and Mom not supporting her breast.  Mom hesitant to change her positioning.  Mom holding the nipple shield between her fingers and pulling breast away from baby's mouth.  LC recommended trying football hold on right breast.  Baby placed in football hold on right breast.  20 mm nipple shield applied after hand expressing some milk onto nipple.  Baby opened his mouth well and while assisting Mom to support her breast with 4 fingers under and a thumb above breast, baby able to attain a deeper latch to breast.  Baby paced himself well.  Gentle breast compression done during sucking bursts.  After 10 mins, Mom felt a "let-down" and baby backed off and coughed.  No brady or desat episode and baby recovered easily with some upright sitting and gentle patting of back.    With let-down, milk was streaming out of nipple overflowing the nipple shield.  Talked to Mom about importance of pre-pumping until after let down phase over, to avoid baby being overwhelmed with fast flow.  Baby did return to breast and feed for another few minutes before becoming sleepy and placed STS on her chest.  Baby fed well for 12 mins with a deep latch with Mom providing breast support.  Encouraged Mom to pump 5 mins pre breastfeeding and finish pumping after baby breastfeeds.  Mom rooming-in with baby this week to work on oral feedings.    F/U with lactation tomorrow at 0900  Feeding Nipple Type: Nfant Extra Slow Flow (gold) (on DB bottle)  LATCH  Score Latch: Grasps breast easily, tongue down, lips flanged, rhythmical sucking.  Audible Swallowing: Spontaneous and intermittent  Type of Nipple: Everted at rest and after stimulation  Comfort (Breast/Nipple): Soft / non-tender  Hold (Positioning): Assistance needed to correctly position infant at breast and maintain latch.  LATCH Score: 9   Lactation Tools Discussed/Used Tools: Pump;Nipple Shields Nipple shield size: 20 Flange Size: 24 Breast pump type: Double-Electric Breast Pump Pumping frequency: 6-7 times/24 hrs Pumped volume: 150 mL  Interventions Interventions: Assisted with latch;Skin to skin;Breast massage;Hand express;Pre-pump if needed;Breast compression;Support pillows;Position options;DEBP;Education  Consult Status Consult Status: Follow-up Date: 12/05/20 Follow-up type: In-patient    [name] 12/04/2020, 9:20 AM

## 2020-12-05 ENCOUNTER — Ambulatory Visit: Payer: Self-pay

## 2020-12-05 NOTE — Lactation Note (Signed)
This note was copied from a baby's chart. Lactation Consultation Note Observed 20 minute feeding. Infant alternated between nutritive and non-nutritive suckling. No adverse events during feeding. Infant was satisfied p bf. Mother pre-pumped prior to this feeding.   Patient Name: Boy Nazareth Norenberg [id number] Date: 12/05/2020 Reason for consult:  (breastfeeding assessment) Age:29 m.o.  Maternal Data  Mother's supply is normal to abundant. She pumps routinely.   Feeding Mother's Current Feeding Choice: Breast Milk Nipple Type: Nfant Extra Slow Flow (gold)  LATCH Score Latch: Grasps breast easily, tongue down, lips flanged, rhythmical sucking.  Audible Swallowing: Spontaneous and intermittent  Type of Nipple: Everted at rest and after stimulation  Comfort (Breast/Nipple): Soft / non-tender  Hold (Positioning): No assistance needed to correctly position infant at breast.  LATCH Score: 10   Lactation Tools Discussed/Used Nipple shield size: 20   Consult Status Consult Status: Follow-up Follow-up type: In-patient   Elder Negus, MA IBCLC 12/05/2020, 9:44 AM

## 2020-12-12 ENCOUNTER — Other Ambulatory Visit (HOSPITAL_COMMUNITY)
Admission: RE | Admit: 2020-12-12 | Discharge: 2020-12-12 | Disposition: A | Discharge status: Home / Self Care | Payer: Medicaid Other | Source: Ambulatory Visit | Attending: Family Medicine | Admitting: Family Medicine

## 2020-12-12 ENCOUNTER — Ambulatory Visit (INDEPENDENT_AMBULATORY_CARE_PROVIDER_SITE_OTHER): Payer: Medicaid Other

## 2020-12-12 ENCOUNTER — Other Ambulatory Visit: Payer: Self-pay

## 2020-12-12 VITALS — BP 132/92 | HR 68 | Ht 64.0 in | Wt 276.1 lb

## 2020-12-12 DIAGNOSIS — N898 Other specified noninflammatory disorders of vagina: Secondary | ICD-10-CM

## 2020-12-12 NOTE — Progress Notes (Signed)
Pt states having vaginal discharge, odor, itching since stopped cycle. Pt states she used Monistat 1 day yesterday and didn't have relief. Denies fever, abd pain or cramps. Hx of trich before pregnancy. Pt is 12 weeks PP.  Pt has hx of BV.  Pt BP today is  132/92. Pt states was taken off BP meds at [facility] appt on 11/02/20. Denies any elevated BP symptoms today. Pt advised to have follow up on BP with PCP.   Pt will do self swab. Self swab collected today. Pt advised results will take 24-48 hours and will see results in mychart and will be notified if needs further treatment. Pt verbalized understanding.   Pt also expresses that she desires IUD. Pt would like to set up birth control options appt today. Will do at front office today.   Judeth Cornfield, RN

## 2020-12-13 ENCOUNTER — Telehealth: Payer: Self-pay | Admitting: Lactation Services

## 2020-12-13 LAB — CERVICOVAGINAL ANCILLARY ONLY
Bacterial Vaginitis (gardnerella): POSITIVE — AB
Candida Glabrata: NEGATIVE
Candida Vaginitis: NEGATIVE
Chlamydia: NEGATIVE
Comment: NEGATIVE
Comment: NEGATIVE
Comment: NEGATIVE
Comment: NEGATIVE
Comment: NEGATIVE
Comment: NORMAL
Neisseria Gonorrhea: NEGATIVE
Trichomonas: POSITIVE — AB

## 2020-12-13 MED ORDER — METRONIDAZOLE 500 MG PO TABS: 2000.0000 mg | ORAL_TABLET | Freq: Once | ORAL | 0 refills | Status: AC

## 2020-12-13 NOTE — Telephone Encounter (Signed)
-----   Message from Brand Males, CNM sent at 12/13/2020  3:50 PM EDT ----- Patient is positive for both trichomonas and BV. Please send in 2g metronidazole orally x1 dose. This will treat both trichomonas and BV. I recommend patient abstain from intercourse for 7 days after all partners have been treated.   Thanks! Duwayne Heck, CNM

## 2020-12-13 NOTE — Telephone Encounter (Signed)
Called patient to give results of vaginal swab being + for Trich and BV. Patient did not answer, LM for patient to call the office for results.   LM that a prescription is being sent to her Pharmacy and should be available today.

## 2020-12-21 ENCOUNTER — Ambulatory Visit (INDEPENDENT_AMBULATORY_CARE_PROVIDER_SITE_OTHER): Payer: Medicaid Other | Admitting: Physician Assistant

## 2020-12-21 ENCOUNTER — Encounter: Payer: Self-pay | Admitting: Orthopedic Surgery

## 2020-12-21 DIAGNOSIS — M722 Plantar fascial fibromatosis: Secondary | ICD-10-CM

## 2020-12-21 MED ORDER — LIDOCAINE HCL 1 % IJ SOLN
3.0000 mL | INTRAMUSCULAR | Status: AC | PRN
  Administered 2020-12-21: 3 mL
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
## 2020-12-28 ENCOUNTER — Ambulatory Visit (INDEPENDENT_AMBULATORY_CARE_PROVIDER_SITE_OTHER): Payer: Medicaid Other | Admitting: Obstetrics and Gynecology

## 2020-12-28 ENCOUNTER — Other Ambulatory Visit: Payer: Self-pay

## 2020-12-28 ENCOUNTER — Other Ambulatory Visit (HOSPITAL_COMMUNITY)
Admission: RE | Admit: 2020-12-28 | Discharge: 2020-12-28 | Disposition: A | Discharge status: Home / Self Care | Payer: Medicaid Other | Source: Ambulatory Visit | Attending: Obstetrics and Gynecology | Admitting: Obstetrics and Gynecology

## 2020-12-28 ENCOUNTER — Encounter: Payer: Self-pay | Admitting: Obstetrics and Gynecology

## 2020-12-28 ENCOUNTER — Other Ambulatory Visit: Payer: Self-pay | Admitting: Lactation Services

## 2020-12-28 VITALS — BP 119/92 | HR 79 | Wt 276.6 lb

## 2020-12-28 DIAGNOSIS — Z30430 Encounter for insertion of intrauterine contraceptive device: Secondary | ICD-10-CM | POA: Diagnosis not present

## 2020-12-28 DIAGNOSIS — Z113 Encounter for screening for infections with a predominantly sexual mode of transmission: Secondary | ICD-10-CM | POA: Diagnosis present

## 2020-12-28 DIAGNOSIS — N898 Other specified noninflammatory disorders of vagina: Secondary | ICD-10-CM | POA: Diagnosis not present

## 2020-12-28 DIAGNOSIS — Z8619 Personal history of other infectious and parasitic diseases: Secondary | ICD-10-CM

## 2020-12-28 DIAGNOSIS — Z309 Encounter for contraceptive management, unspecified: Secondary | ICD-10-CM | POA: Insufficient documentation

## 2020-12-28 LAB — POCT PREGNANCY, URINE: Preg Test, Ur: NEGATIVE

## 2020-12-28 MED ORDER — PRENATAL PLUS 27-1 MG PO TABS: 1.0000 | ORAL_TABLET | Freq: Every day | ORAL | 12 refills | Status: DC

## 2020-12-28 MED ORDER — PARAGARD INTRAUTERINE COPPER IU IUD
1.0000 | INTRAUTERINE_SYSTEM | Freq: Once | INTRAUTERINE | Status: AC
  Administered 2020-12-28: 1 via INTRAUTERINE
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
## 2020-12-28 NOTE — Patient Instructions (Signed)
We placed a Copper IUD today. It is effective for up to 10 years.

## 2020-12-29 LAB — CERVICOVAGINAL ANCILLARY ONLY
Bacterial Vaginitis (gardnerella): NEGATIVE
Candida Glabrata: NEGATIVE
Candida Vaginitis: NEGATIVE
Chlamydia: NEGATIVE
Comment: NEGATIVE
Comment: NEGATIVE
Comment: NEGATIVE
Comment: NEGATIVE
Comment: NEGATIVE
Comment: NORMAL
Neisseria Gonorrhea: NEGATIVE
Trichomonas: POSITIVE — AB

## 2021-01-01 ENCOUNTER — Telehealth: Payer: Self-pay | Admitting: Family Medicine

## 2021-01-01 ENCOUNTER — Telehealth: Payer: Self-pay | Admitting: Obstetrics and Gynecology

## 2021-01-01 DIAGNOSIS — A599 Trichomoniasis, unspecified: Secondary | ICD-10-CM

## 2021-01-01 MED ORDER — METRONIDAZOLE 500 MG PO TABS: 500.0000 mg | ORAL_TABLET | Freq: Two times a day (BID) | ORAL | 0 refills | Status: AC

## 2021-01-01 MED ORDER — METRONIDAZOLE 500 MG PO TABS: 500.0000 mg | ORAL_TABLET | Freq: Two times a day (BID) | ORAL | 0 refills | Status: DC

## 2021-01-01 NOTE — Telephone Encounter (Signed)
Call placed to pt. Spoke with pt. Pt states wanting to know results from 12/28/20.  Per reviewed notes, Pt had too early TOC with previous trich dx on 12/13/20. Pt states was only given 4 tabs of Flagyl to take once for treatment.  Advised will review with provider in office today for further treatment and resend in Rx Flagyl.  Pt has scheduled 4 week IUD string check with TOC on 01/25/21. Pt agreeable to plan of care and verbalized understanding.   Judeth Cornfield, RN

## 2021-01-01 NOTE — Telephone Encounter (Signed)
New script for flagyl 500mg  BID x7d prescribed given need for repeat treatment of trich. TOC still positive. MyChart Message also sent to patient. Plan for repeat swab at IUD string check in 4 weeks. Discussed plan with Dr. .  Macon Large, MD OB Fellow, Faculty Practice 01/01/2021 9:52 PM

## 2021-01-01 NOTE — Telephone Encounter (Signed)
Calling to get lab results 

## 2021-01-01 NOTE — Telephone Encounter (Signed)
Spoke with Nolene Bernheim, NP. Advised to treat pt with Flagyl 500mg  BID x 7 days. Pt agreeable to plan of care and Rx sent to pharmacy.  Pt to have IUD string check and possible TOC on 01/25/21. Pt verbalized understanding.   03/27/21, RN

## 2021-01-03 ENCOUNTER — Ambulatory Visit: Payer: Medicaid Other | Admitting: Family Medicine

## 2021-01-11 ENCOUNTER — Ambulatory Visit: Payer: Medicaid Other | Admitting: Orthopedic Surgery

## 2021-01-15 ENCOUNTER — Ambulatory Visit: Payer: Medicaid Other | Admitting: Orthopedic Surgery

## 2021-01-25 ENCOUNTER — Encounter: Payer: Self-pay | Admitting: Obstetrics & Gynecology

## 2021-01-25 ENCOUNTER — Ambulatory Visit (INDEPENDENT_AMBULATORY_CARE_PROVIDER_SITE_OTHER): Payer: Medicaid Other | Admitting: Obstetrics & Gynecology

## 2021-01-25 ENCOUNTER — Other Ambulatory Visit (HOSPITAL_COMMUNITY)
Admission: RE | Admit: 2021-01-25 | Discharge: 2021-01-25 | Disposition: A | Discharge status: Home / Self Care | Payer: Medicaid Other | Source: Ambulatory Visit | Attending: Obstetrics & Gynecology | Admitting: Obstetrics & Gynecology

## 2021-01-25 ENCOUNTER — Other Ambulatory Visit: Payer: Self-pay

## 2021-01-25 VITALS — BP 128/82 | Ht 64.0 in | Wt 279.2 lb

## 2021-01-25 DIAGNOSIS — Z30431 Encounter for routine checking of intrauterine contraceptive device: Secondary | ICD-10-CM

## 2021-01-25 DIAGNOSIS — N898 Other specified noninflammatory disorders of vagina: Secondary | ICD-10-CM | POA: Diagnosis not present

## 2021-01-25 NOTE — Progress Notes (Addendum)
  History:  [name] is a 30 y.o. [phone] who presents to clinic today for a 4 week string check for her IUD. She reports having a heavy menstrual cycle a week ago which was "the heaviest she had ever had." She was changing a regular sized tampon every hour for a couple days. She also endorses slightly stronger cramping which was relieved with ibuprofen. She had an ear infection recently and has been taking antibiotics. Additionally, she had a trichomonas infection noted on 12/28/20 and completed a course of Flagyl. She still reports abnormal vaginal discharge which is uncomfortable.   The following portions of the patient's history were reviewed and updated as appropriate: allergies, current medications, family history, past medical history, social history, past surgical history and problem list.  Review of Systems:  Review of Systems  HENT:  Negative for ear pain.   Gastrointestinal:  Positive for abdominal pain (menstrual cramping which has resolved).  Genitourinary:  Negative for dysuria.       Uncomfortable vaginal discharge  Psychiatric/Behavioral:  The patient is not nervous/anxious.      Objective:  Physical Exam BP 128/82   Ht 5\' 4"  (1.626 m)   Wt 279 lb 3.2 oz (126.6 kg)   LMP 01/18/2021 (Exact Date)   Breastfeeding No   BMI 47.92 kg/m  Physical Exam Exam conducted with a chaperone present.  Constitutional:      Appearance: Normal appearance.  HENT:     Head: Normocephalic and atraumatic.  Cardiovascular:     Rate and Rhythm: Normal rate.  Pulmonary:     Effort: Pulmonary effort is normal.  Genitourinary:    General: Normal vulva.     Exam position: Lithotomy position.     Vagina: Vaginal discharge (white, pooling) present.     Comments: IUD string observed. No bleeding noted from the cervical os.  Skin:    General: Skin is warm and dry.  Neurological:     Mental Status: She is alert and oriented to person, place, and time.  Psychiatric:        Mood and  Affect: Mood normal.        Behavior: Behavior normal.      Labs and Imaging No results found for this or any previous visit (from the past 24 hour(s)).  No results found.  Health Maintenance Due  Topic Date Due   COVID-19 Vaccine (1) Never done   INFLUENZA VACCINE  01/15/2021    Assessment & Plan:  1. Encounter for routine checking of intrauterine contraceptive device (IUD) - Strings observed, no cervical bleeding noted  - Will continue to check placement at annual exams  - Instructed patient to continue taking ibuprofen for cramping as needed   2. Vaginal irritation - White pooling discharge noted in the vaginal canal  - Cervicovaginal ancillary testing ordered   03/17/2021 01/25/2021 2:09 PM   Attestation of Attending Supervision of PAStudent: Evaluation and management procedures were performed by the student under my supervision and collaboration.  I have reviewed the student's note and chart, and I agree with the management and plan.  03/27/2021, MD, FACOG Attending Obstetrician & Gynecologist Faculty Practice, [facility]

## 2021-01-25 NOTE — Progress Notes (Signed)
Patient in today for a string check. States that she has been having irritation since Monday 8/8.  Tested positive on 7/14 for trich, completed Flagyl but still experiencing issues. Would like to be swab today.     Wynona Canes, CMA

## 2021-01-26 ENCOUNTER — Ambulatory Visit: Payer: Medicaid Other | Admitting: Physician Assistant

## 2021-01-26 LAB — CERVICOVAGINAL ANCILLARY ONLY
Bacterial Vaginitis (gardnerella): POSITIVE — AB
Candida Glabrata: NEGATIVE
Candida Vaginitis: NEGATIVE
Chlamydia: NEGATIVE
Comment: NEGATIVE
Comment: NEGATIVE
Comment: NEGATIVE
Comment: NEGATIVE
Comment: NEGATIVE
Comment: NORMAL
Neisseria Gonorrhea: NEGATIVE
Trichomonas: POSITIVE — AB

## 2021-01-30 ENCOUNTER — Ambulatory Visit (INDEPENDENT_AMBULATORY_CARE_PROVIDER_SITE_OTHER): Payer: Medicaid Other | Admitting: Family

## 2021-01-30 ENCOUNTER — Encounter: Payer: Self-pay | Admitting: Family

## 2021-01-30 ENCOUNTER — Other Ambulatory Visit: Payer: Self-pay

## 2021-01-30 DIAGNOSIS — M722 Plantar fascial fibromatosis: Secondary | ICD-10-CM

## 2021-01-30 MED ORDER — METRONIDAZOLE 500 MG PO TABS: 500.0000 mg | ORAL_TABLET | Freq: Two times a day (BID) | ORAL | 0 refills | Status: DC

## 2021-01-31 ENCOUNTER — Encounter: Payer: Self-pay | Admitting: Family

## 2021-01-31 ENCOUNTER — Ambulatory Visit: Payer: Medicaid Other | Admitting: Clinical

## 2021-01-31 DIAGNOSIS — Z658 Other specified problems related to psychosocial circumstances: Secondary | ICD-10-CM

## 2021-01-31 MED ORDER — NABUMETONE 500 MG PO TABS: 500.0000 mg | ORAL_TABLET | Freq: Every day | ORAL | 1 refills | Status: DC
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
## 2021-01-31 NOTE — BH Specialist Note (Signed)
Integrated Behavioral Health via Telemedicine Visit  Less than 5 minute visit, "going through a lot" right now, due to conflict with son's appointment due to sickness today; pt agrees to virtual 30 minute-only consult visit on 02/01/21 at 10:15am; pt is aware that invite will be sent to her phone at the time of her visit tomorrow.

## 2021-02-01 ENCOUNTER — Ambulatory Visit (INDEPENDENT_AMBULATORY_CARE_PROVIDER_SITE_OTHER): Payer: Medicaid Other | Admitting: Clinical

## 2021-02-01 DIAGNOSIS — F319 Bipolar disorder, unspecified: Secondary | ICD-10-CM | POA: Diagnosis not present

## 2021-02-01 NOTE — BH Specialist Note (Signed)
Integrated Behavioral Health via Telemedicine Visit  02/01/2021 Annah Jasko [id number]  Number of Integrated Behavioral Health visits: 1 Session Start time: 10:16  Session End time: 10:27 Total time:  11  Referring Provider: Scheryl Darter, MD Patient/Family location: [place], [place] [facility] Provider location: Center for [facility]  All persons participating in visit: Patient Tersea Aulds and [facility] Lonie Rummell   Types of Service: Telephone visit  I connected with Ashley Murrain and/or Doree Fudge  n/a  via  Telephone or Video Enabled Telemedicine Application  (Video is Caregility application) and verified that I am speaking with the correct person using two identifiers. Discussed confidentiality: Yes   I discussed the limitations of telemedicine and the availability of in person appointments.  Discussed there is a possibility of technology failure and discussed alternative modes of communication if that failure occurs.  I discussed that engaging in this telemedicine visit, they consent to the provision of behavioral healthcare and the services will be billed under their insurance.  Patient and/or legal guardian expressed understanding and consented to Telemedicine visit: Yes   Presenting Concerns: Patient and/or family reports the following symptoms/concerns: Pt states her primary concern today is needing BH medication management and "going through a lot, things are getting on top of me"; will be starting to see therapist at [facility] soon for weekly appointments, and was told she could go to [facility] to start medication; previously on Zoloft (with previous therapist), and prefers another medication.  Duration of problem: Ongoing; Severity of problem: moderate  Patient and/or Family's Strengths/Protective Factors: Sense of purpose  Goals Addressed: Patient will:  Reduce symptoms of: mood instability and stress   Increase knowledge  and/or ability of: stress reduction   Progress towards Goals: Ongoing  Interventions: Interventions utilized:  Supportive Reflection Standardized Assessments completed: Not Needed  Patient and/or Family Response: Pt agrees with treatment plan  Assessment: Patient currently experiencing History of Bipolar 1 disorder; pt says also diagnosed previously with Borderline Personality Disorder.   Patient may benefit from psychoeducation and brief therapeutic interventions today, and referral to psychiatry for [facility] medication management. .  Plan: Follow up with behavioral health clinician on : Call Asher Muir as needed at [phone] Behavioral recommendations:  -Continue with plan to go to [facility] tomorrow morning OR [facility] walk-in outpatient to establish care with psychiatry for [facility] medication management -Continue with plan to attend weekly therapy sessions with [facility] Referral(s): Integrated [employer] (In Clinic)  I discussed the assessment and treatment plan with the patient and/or parent/guardian. They were provided an opportunity to ask questions and all were answered. They agreed with the plan and demonstrated an understanding of the instructions.   They were advised to call back or seek an in-person evaluation if the symptoms worsen or if the condition fails to improve as anticipated.  Rae Lips, LCSW  Depression screen [facility] 2/9 01/25/2021 12/28/2020 11/02/2020 09/25/2020 08/22/2020  Decreased Interest 1 1 0 0 0  Down, Depressed, Hopeless 2 1 0 0 0  PHQ - 2 Score 3 2 0 0 0  Altered sleeping 0 0 0 1 0  Tired, decreased energy 1 1 1 1 1   Change in appetite 2 2 0 1 1  Feeling bad or failure about yourself  2 0 0 0 0  Trouble concentrating 0 0 0 0 0  Moving slowly or fidgety/restless 0 0 0 0 0  Suicidal thoughts 0 0 0 0 0  PHQ-9 Score  8 5 1 3 2   Difficult doing work/chores - - - Not difficult at all -  Some recent data might be hidden   GAD 7 : Generalized  Anxiety Score 01/25/2021 12/28/2020 11/02/2020 09/25/2020  Nervous, Anxious, on Edge 2 1 1  -  Control/stop worrying 1 1 0 1  Worry too much - different things 1 0 1 0  Trouble relaxing 1 0 0 0  Restless 1 0 0 0  Easily annoyed or irritable 1 1 0 0  Afraid - awful might happen 0 0 0 1  Total GAD 7 Score 7 3 2  -

## 2021-03-05 ENCOUNTER — Ambulatory Visit: Payer: Medicaid Other

## 2021-03-19 ENCOUNTER — Observation Stay (HOSPITAL_COMMUNITY)
Admission: EM | Admit: 2021-03-19 | Discharge: 2021-03-20 | Disposition: A | Discharge status: Home / Self Care | Payer: Medicaid Other | Attending: Internal Medicine | Admitting: Internal Medicine

## 2021-03-19 ENCOUNTER — Encounter (HOSPITAL_COMMUNITY): Payer: Self-pay

## 2021-03-19 ENCOUNTER — Emergency Department (HOSPITAL_COMMUNITY): Payer: Medicaid Other

## 2021-03-19 DIAGNOSIS — R112 Nausea with vomiting, unspecified: Secondary | ICD-10-CM | POA: Diagnosis present

## 2021-03-19 DIAGNOSIS — B9689 Other specified bacterial agents as the cause of diseases classified elsewhere: Secondary | ICD-10-CM | POA: Diagnosis not present

## 2021-03-19 DIAGNOSIS — R109 Unspecified abdominal pain: Secondary | ICD-10-CM | POA: Diagnosis present

## 2021-03-19 DIAGNOSIS — R197 Diarrhea, unspecified: Secondary | ICD-10-CM | POA: Diagnosis not present

## 2021-03-19 DIAGNOSIS — E876 Hypokalemia: Secondary | ICD-10-CM | POA: Insufficient documentation

## 2021-03-19 DIAGNOSIS — Z8616 Personal history of COVID-19: Secondary | ICD-10-CM | POA: Insufficient documentation

## 2021-03-19 DIAGNOSIS — A0472 Enterocolitis due to Clostridium difficile, not specified as recurrent: Secondary | ICD-10-CM | POA: Diagnosis present

## 2021-03-19 DIAGNOSIS — Z87891 Personal history of nicotine dependence: Secondary | ICD-10-CM | POA: Insufficient documentation

## 2021-03-19 DIAGNOSIS — Z20822 Contact with and (suspected) exposure to covid-19: Secondary | ICD-10-CM | POA: Insufficient documentation

## 2021-03-19 LAB — C DIFFICILE QUICK SCREEN W PCR REFLEX
C Diff antigen: POSITIVE — AB
C Diff toxin: NEGATIVE

## 2021-03-19 LAB — CBC
HCT: 42.5 % (ref 36.0–46.0)
Hemoglobin: 13.3 g/dL (ref 12.0–15.0)
MCH: 26.1 pg (ref 26.0–34.0)
MCHC: 31.3 g/dL (ref 30.0–36.0)
MCV: 83.3 fL (ref 80.0–100.0)
Platelets: 324 10*3/uL (ref 150–400)
RBC: 5.1 MIL/uL (ref 3.87–5.11)
RDW: 15 % (ref 11.5–15.5)
WBC: 10.2 10*3/uL (ref 4.0–10.5)
nRBC: 0 % (ref 0.0–0.2)

## 2021-03-19 LAB — COMPREHENSIVE METABOLIC PANEL
ALT: 21 U/L (ref 0–44)
AST: 18 U/L (ref 15–41)
Albumin: 3.9 g/dL (ref 3.5–5.0)
Alkaline Phosphatase: 66 U/L (ref 38–126)
Anion gap: 7 (ref 5–15)
BUN: 12 mg/dL (ref 6–20)
CO2: 25 mmol/L (ref 22–32)
Calcium: 8.8 mg/dL — ABNORMAL LOW (ref 8.9–10.3)
Chloride: 105 mmol/L (ref 98–111)
Creatinine, Ser: 0.72 mg/dL (ref 0.44–1.00)
GFR, Estimated: >60 mL/min (ref >60)
Glucose, Bld: 103 mg/dL — ABNORMAL HIGH (ref 70–99)
Potassium: 3.2 mmol/L — ABNORMAL LOW (ref 3.5–5.1)
Sodium: 137 mmol/L (ref 135–145)
Total Bilirubin: 0.7 mg/dL (ref 0.3–1.2)
Total Protein: 7.6 g/dL (ref 6.5–8.1)

## 2021-03-19 LAB — MAGNESIUM: Magnesium: 1.8 mg/dL (ref 1.7–2.4)

## 2021-03-19 LAB — URINALYSIS, ROUTINE W REFLEX MICROSCOPIC
Bilirubin Urine: NEGATIVE
Glucose, UA: NEGATIVE mg/dL
Hgb urine dipstick: NEGATIVE
Ketones, ur: NEGATIVE mg/dL
Nitrite: NEGATIVE
Protein, ur: 30 mg/dL — AB
Specific Gravity, Urine: 1.029 (ref 1.005–1.030)
pH: 5 (ref 5.0–8.0)

## 2021-03-19 LAB — LIPASE, BLOOD: Lipase: 35 U/L (ref 11–51)

## 2021-03-19 LAB — I-STAT BETA HCG BLOOD, ED (MC, WL, AP ONLY): I-stat hCG, quantitative: <5 m[IU]/mL (ref <5)

## 2021-03-19 LAB — CLOSTRIDIUM DIFFICILE BY PCR, REFLEXED: Toxigenic C. Difficile by PCR: POSITIVE — AB

## 2021-03-19 LAB — RESP PANEL BY RT-PCR (FLU A&B, COVID) ARPGX2
Influenza A by PCR: NEGATIVE
Influenza B by PCR: NEGATIVE
SARS Coronavirus 2 by RT PCR: NEGATIVE

## 2021-03-19 MED ORDER — METRONIDAZOLE 500 MG/100ML IV SOLN
500.0000 mg | Freq: Three times a day (TID) | INTRAVENOUS | Status: DC
  Administered 2021-03-19 – 2021-03-20 (×3): 500 mg via INTRAVENOUS
  Filled 2021-03-19 (×3): qty 100

## 2021-03-19 MED ORDER — SODIUM CHLORIDE (PF) 0.9 % IJ SOLN
INTRAMUSCULAR | Status: AC
  Filled 2021-03-19: qty 50

## 2021-03-19 MED ORDER — ONDANSETRON HCL 4 MG/2ML IJ SOLN
4.0000 mg | Freq: Once | INTRAMUSCULAR | Status: AC
  Administered 2021-03-19: 4 mg via INTRAVENOUS
  Filled 2021-03-19: qty 2

## 2021-03-19 MED ORDER — ALUM & MAG HYDROXIDE-SIMETH 200-200-20 MG/5ML PO SUSP
30.0000 mL | Freq: Once | ORAL | Status: AC
  Administered 2021-03-19: 30 mL via ORAL
  Filled 2021-03-19: qty 30

## 2021-03-19 MED ORDER — FENTANYL CITRATE PF 50 MCG/ML IJ SOSY
50.0000 ug | PREFILLED_SYRINGE | Freq: Once | INTRAMUSCULAR | Status: AC
Start: 2021-03-19 — End: 2021-03-19
  Administered 2021-03-19: 50 ug via INTRAVENOUS
  Filled 2021-03-19: qty 1

## 2021-03-19 MED ORDER — SODIUM CHLORIDE 0.9 % IV SOLN: Freq: Once | INTRAVENOUS | Status: AC

## 2021-03-19 MED ORDER — DICYCLOMINE HCL 10 MG/ML IM SOLN
20.0000 mg | Freq: Once | INTRAMUSCULAR | Status: AC
  Administered 2021-03-19: 20 mg via INTRAMUSCULAR
  Filled 2021-03-19: qty 2

## 2021-03-19 MED ORDER — SODIUM CHLORIDE 0.9 % IV SOLN
12.5000 mg | Freq: Four times a day (QID) | INTRAVENOUS | Status: DC | PRN
  Administered 2021-03-19: 12.5 mg via INTRAVENOUS
  Filled 2021-03-19: qty 0.5
  Filled 2021-03-19: qty 12.5

## 2021-03-19 MED ORDER — POTASSIUM CHLORIDE CRYS ER 20 MEQ PO TBCR
40.0000 meq | EXTENDED_RELEASE_TABLET | Freq: Once | ORAL | Status: AC
  Administered 2021-03-19: 40 meq via ORAL
  Filled 2021-03-19: qty 2

## 2021-03-19 MED ORDER — IOHEXOL 350 MG/ML SOLN
75.0000 mL | Freq: Once | INTRAVENOUS | Status: AC | PRN
  Administered 2021-03-19: 75 mL via INTRAVENOUS

## 2021-03-19 MED ORDER — FENTANYL CITRATE PF 50 MCG/ML IJ SOSY
25.0000 ug | PREFILLED_SYRINGE | INTRAMUSCULAR | Status: DC | PRN
  Administered 2021-03-19 – 2021-03-20 (×3): 25 ug via INTRAVENOUS
  Filled 2021-03-19 (×3): qty 1

## 2021-03-19 MED ORDER — METOCLOPRAMIDE HCL 5 MG/ML IJ SOLN
5.0000 mg | Freq: Three times a day (TID) | INTRAMUSCULAR | Status: DC
Start: 2021-03-19 — End: 2021-03-20
  Administered 2021-03-19 – 2021-03-20 (×4): 5 mg via INTRAVENOUS
  Filled 2021-03-19 (×4): qty 2

## 2021-03-19 MED ORDER — SODIUM CHLORIDE 0.9 % IV BOLUS
1000.0000 mL | Freq: Once | INTRAVENOUS | Status: AC
  Administered 2021-03-19: 1000 mL via INTRAVENOUS

## 2021-03-19 MED ORDER — FAMOTIDINE IN NACL 20-0.9 MG/50ML-% IV SOLN
20.0000 mg | Freq: Once | INTRAVENOUS | Status: AC
  Administered 2021-03-19: 20 mg via INTRAVENOUS
  Filled 2021-03-19: qty 50
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
## 2021-03-19 NOTE — ED Notes (Signed)
Patient failed PO challenge. Hina, PA made aware.
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
## 2021-03-19 NOTE — ED Notes (Signed)
Patient transported to CT 

## 2021-03-19 NOTE — ED Notes (Signed)
Pt has a labeled urine specimen cup to provide a sample when ready.

## 2021-03-19 NOTE — ED Triage Notes (Addendum)
Pt arrived via POV, c/o n/v/d and diffuse abd pain x4 days. Denies urinary issues.

## 2021-03-20 ENCOUNTER — Other Ambulatory Visit (HOSPITAL_COMMUNITY): Payer: Self-pay

## 2021-03-20 DIAGNOSIS — A0472 Enterocolitis due to Clostridium difficile, not specified as recurrent: Secondary | ICD-10-CM | POA: Diagnosis not present

## 2021-03-20 HISTORY — DX: Enterocolitis due to Clostridium difficile, not specified as recurrent: A04.72

## 2021-03-20 LAB — GASTROINTESTINAL PANEL BY PCR, STOOL (REPLACES STOOL CULTURE)

## 2021-03-20 LAB — CBC
HCT: 35.6 % — ABNORMAL LOW (ref 36.0–46.0)
Hemoglobin: 11.2 g/dL — ABNORMAL LOW (ref 12.0–15.0)
MCH: 26.3 pg (ref 26.0–34.0)
MCHC: 31.5 g/dL (ref 30.0–36.0)
MCV: 83.6 fL (ref 80.0–100.0)
Platelets: 232 10*3/uL (ref 150–400)
RBC: 4.26 MIL/uL (ref 3.87–5.11)
RDW: 14.9 % (ref 11.5–15.5)
WBC: 6.1 10*3/uL (ref 4.0–10.5)
nRBC: 0 % (ref 0.0–0.2)

## 2021-03-20 LAB — COMPREHENSIVE METABOLIC PANEL
ALT: 22 U/L (ref 0–44)
AST: 19 U/L (ref 15–41)
Albumin: 3.4 g/dL — ABNORMAL LOW (ref 3.5–5.0)
Alkaline Phosphatase: 59 U/L (ref 38–126)
Anion gap: 4 — ABNORMAL LOW (ref 5–15)
BUN: 8 mg/dL (ref 6–20)
CO2: 22 mmol/L (ref 22–32)
Calcium: 8.5 mg/dL — ABNORMAL LOW (ref 8.9–10.3)
Chloride: 116 mmol/L — ABNORMAL HIGH (ref 98–111)
Creatinine, Ser: 0.72 mg/dL (ref 0.44–1.00)
GFR, Estimated: >60 mL/min (ref >60)
Glucose, Bld: 88 mg/dL (ref 70–99)
Potassium: 3.4 mmol/L — ABNORMAL LOW (ref 3.5–5.1)
Sodium: 142 mmol/L (ref 135–145)
Total Bilirubin: 0.8 mg/dL (ref 0.3–1.2)
Total Protein: 6.4 g/dL — ABNORMAL LOW (ref 6.5–8.1)

## 2021-03-20 LAB — MAGNESIUM: Magnesium: 1.8 mg/dL (ref 1.7–2.4)

## 2021-03-20 MED ORDER — VANCOMYCIN HCL 125 MG PO CAPS
125.0000 mg | ORAL_CAPSULE | Freq: Four times a day (QID) | ORAL | Status: DC
  Administered 2021-03-20: 125 mg via ORAL
  Filled 2021-03-20 (×2): qty 1

## 2021-03-20 MED ORDER — VANCOMYCIN HCL 125 MG PO CAPS
125.0000 mg | ORAL_CAPSULE | Freq: Four times a day (QID) | ORAL | 0 refills | Status: AC
  Filled 2021-03-20: qty 40, 10d supply, fill #0

## 2021-03-20 MED ORDER — POTASSIUM CHLORIDE CRYS ER 20 MEQ PO TBCR
40.0000 meq | EXTENDED_RELEASE_TABLET | Freq: Once | ORAL | Status: AC
  Administered 2021-03-20: 40 meq via ORAL
  Filled 2021-03-20: qty 2

## 2021-03-20 MED ORDER — POTASSIUM CHLORIDE 10 MEQ/100ML IV SOLN
10.0000 meq | INTRAVENOUS | Status: DC
  Filled 2021-03-20: qty 100

## 2021-03-20 MED ORDER — PROMETHAZINE HCL 12.5 MG PO TABS
12.5000 mg | ORAL_TABLET | Freq: Four times a day (QID) | ORAL | 0 refills | Status: DC | PRN
  Filled 2021-03-20: qty 15, 4d supply, fill #0

## 2021-03-20 NOTE — Discharge Summary (Signed)
Physician Discharge Summary  Alyssa Ray EBX:[id number] DOB: 09/17/1990 DOA: 03/19/2021  PCP: System, Provider Not In  Admit date: 03/19/2021 Discharge date: 03/20/2021  Admitted From: Home Disposition: Home  Recommendations for Outpatient Follow-up:  Follow up with PCP in 1-2 weeks Continue oral vancomycin to complete course for C. difficile diarrhea  Home Health: No Equipment/Devices: None  Discharge Condition: Stable CODE STATUS: Full code Diet recommendation: Regular diet  History of present illness:  Alyssa Ray is a 30 year old female with past medical history significant for bipolar disorder, GERD who presents to [facility] ED on 10/3 with persistent diarrhea x4 days.  Denies any recent antibiotic use.  No changes in dietary habits.  Also associated with nausea/vomiting.  Patient has been trying to utilize Gatorade and ibuprofen at home was not much effect.  Given persistence of diarrhea and abdominal discomfort, patient decided to come to the ED for further evaluation.  In the ED, temperature 97.8 F, HR 90, RR 20, BP 144/106, SPO2 100% on room air.  Sodium 137, potassium 3.2, chloride 105, CO2 25, glucose 103, BUN 12, creatinine 0.72.  Magnesium 1.8, lipase 35, AST 18, ALT 21, total bilirubin 0.7.  WBC 10.2, hemoglobin 13.3, platelets 324.  hCG negative.  Influenza A/B PCR negative.  COVID-19 PCR negative.  Urinalysis unrevealing.  C. difficile antigen positive, C. difficile toxin negative.  CT abdomen/pelvis with no acute findings.  Given her persistent diarrhea, abdominal pain and inability to tolerate p.o.  Patient was admitted to the hospital service for further evaluation and treatment.  Hospital course:  C. difficile diarrhea versus viral gastroenteritis Patient presenting to the ED with 4-day history of persistent diarrhea associated with abdominal pain.  Denies any recent antibiotic use.  No dietary changes.  No sick contacts.  Patient is afebrile without  leukocytosis.  CT abdomen/pelvis with no acute abnormality.  Urinalysis unrevealing.  Urine hCG negative.  C. difficile antigen positive, toxin negative which is indeterminate.  Viral GI panel PCR positive for astrovirus.  Patient was started on IV Flagyl with resolution of diarrhea to more formed stool.  Patient now tolerating diet.  Discharging home on oral vancomycin to complete 10-day course.  Hypokalemia Potassium 3.2 on admission, likely secondary to GI loss from diarrhea.  Repleted.   Bipolar disorder: Outpatient follow-up with PCP/behavioral health.  Discharge Diagnoses:  Principal Problem:   C. difficile diarrhea    Discharge Instructions  Discharge Instructions     Call MD for:  difficulty breathing, headache or visual disturbances   Complete by: As directed    Call MD for:  extreme fatigue   Complete by: As directed    Call MD for:  persistant dizziness or light-headedness   Complete by: As directed    Call MD for:  persistant nausea and vomiting   Complete by: As directed    Call MD for:  severe uncontrolled pain   Complete by: As directed    Call MD for:  temperature >100.4   Complete by: As directed    Diet - low sodium heart healthy   Complete by: As directed    Increase activity slowly   Complete by: As directed       Allergies as of 03/20/2021       Reactions   Hydrocodone-acetaminophen Nausea And Vomiting   Nickel Rash   Tramadol Itching   "Makes me itch all over"        Medication List     TAKE these medications  albuterol 108 (90 Base) MCG/ACT inhaler Commonly known as: VENTOLIN HFA Inhale 2 puffs into the lungs every 4 (four) hours as needed for wheezing.   ibuprofen 200 MG tablet Commonly known as: ADVIL Take 400 mg by mouth every 6 (six) hours as needed for mild pain.   promethazine 12.5 MG tablet Commonly known as: PHENERGAN Take 1 tablet (12.5 mg total) by mouth every 6 (six) hours as needed for nausea or vomiting.   vancomycin  125 MG capsule Commonly known as: VANCOCIN Take 1 capsule (125 mg total) by mouth 4 (four) times daily for 10 days.        Allergies  Allergen Reactions   Hydrocodone-Acetaminophen Nausea And Vomiting   Nickel Rash   Tramadol Itching    "Makes me itch all over"    Consultations: None   Procedures/Studies: CT ABDOMEN PELVIS W CONTRAST  Result Date: 03/19/2021 CLINICAL DATA:  30 year old female with history of epigastric pain for the past 4 days. EXAM: CT ABDOMEN AND PELVIS WITH CONTRAST TECHNIQUE: Multidetector CT imaging of the abdomen and pelvis was performed using the standard protocol following bolus administration of intravenous contrast. CONTRAST:  63mL OMNIPAQUE IOHEXOL 350 MG/ML SOLN COMPARISON:  CT the abdomen and pelvis 05/18/2019. FINDINGS: Lower chest: Unremarkable. Hepatobiliary: No suspicious cystic or solid hepatic lesions. No intra or extrahepatic biliary ductal dilatation. Gallbladder is nearly completely decompressed, but otherwise unremarkable in appearance. Pancreas: No pancreatic mass. No pancreatic ductal dilatation. No pancreatic or peripancreatic fluid collections or inflammatory changes. Spleen: Unremarkable. Adrenals/Urinary Tract: Bilateral kidneys and adrenal glands are normal in appearance. No hydroureteronephrosis. Urinary bladder is normal in appearance. Stomach/Bowel: Normal appearance of the stomach. No pathologic dilatation of small bowel or colon. The appendix is not confidently identified and may be surgically absent. Regardless, there are no inflammatory changes noted adjacent to the cecum to suggest the presence of an acute appendicitis at this time. Vascular/Lymphatic: No significant atherosclerotic disease, aneurysm or dissection noted in the abdominal or pelvic vasculature. No lymphadenopathy noted in the abdomen or pelvis. Reproductive: IUD present in the endometrial canal, apparently in a low position with lateral arms in the mid body of the uterus.  Other: No significant volume of ascites.  No pneumoperitoneum. Musculoskeletal: There are no aggressive appearing lytic or blastic lesions noted in the visualized portions of the skeleton. IMPRESSION: 1. No acute findings are noted in the abdomen or pelvis to account for the patient's symptoms. 2. IUD appears in a slightly low position in the endometrial canal. This could be further evaluated with nonemergent pelvic ultrasound if of clinical concern. Electronically Signed   By: Trudie Reed M.D.   On: 03/19/2021 09:10     Subjective: Patient seen examined at bedside this morning, resting comfortably.  States nausea and vomiting have resolved.  Also reports diarrhea resolved and having more formed stools.  Tolerating diet this morning without much issue.  Excited to return home to see her 65-month-old baby.  No other questions or concerns at this time.  Denies headache, no fever/chills/night sweats, no nausea/vomiting/diarrhea, no chest pain, palpitations, no abdominal pain, no weakness, no fatigue, no paresthesias.  No acute events overnight per nurse staff.  Discharge Exam: Vitals:   03/19/21 2318 03/20/21 0253  BP: 119/71 (!) 146/70  Pulse: 63 67  Resp: 17 18  Temp: 98 F (36.7 C) 97.7 F (36.5 C)  SpO2: 98% 98%   Vitals:   03/19/21 1800 03/19/21 1900 03/19/21 2318 03/20/21 0253  BP: 129/86 (!) 141/91 119/71 Marland Kitchen)  146/70  Pulse: 70 72 63 67  Resp: 16 16 17 18   Temp: 97.7 F (36.5 C) 98.1 F (36.7 C) 98 F (36.7 C) 97.7 F (36.5 C)  TempSrc: Oral Oral Oral Oral  SpO2: 99% 99% 98% 98%  Weight:      Height:        General: Pt is alert, awake, not in acute distress Cardiovascular: RRR, S1/S2 +, no rubs, no gallops Respiratory: CTA bilaterally, no wheezing, no rhonchi Abdominal: Soft, NT, ND, bowel sounds + Extremities: no edema, no cyanosis    The results of significant diagnostics from this hospitalization (including imaging, microbiology, ancillary and laboratory) are listed  below for reference.     Microbiology: Recent Results (from the past 240 hour(s))  Resp Panel by RT-PCR (Flu A&B, Covid) Nasopharyngeal Swab     Status: None   Collection Time: 03/19/21 11:33 AM   Specimen: Nasopharyngeal Swab; Nasopharyngeal(NP) swabs in vial transport medium  Result Value Ref Range Status   SARS Coronavirus 2 by RT PCR NEGATIVE NEGATIVE Final    Comment: (NOTE) SARS-CoV-2 target nucleic acids are NOT DETECTED.  The SARS-CoV-2 RNA is generally detectable in upper respiratory specimens during the acute phase of infection. The lowest concentration of SARS-CoV-2 viral copies this assay can detect is 138 copies/mL. A negative result does not preclude SARS-Cov-2 infection and should not be used as the sole basis for treatment or other patient management decisions. A negative result may occur with  improper specimen collection/handling, submission of specimen other than nasopharyngeal swab, presence of viral mutation(s) within the areas targeted by this assay, and inadequate number of viral copies(<138 copies/mL). A negative result must be combined with clinical observations, patient history, and epidemiological information. The expected result is Negative.  Fact Sheet for Patients:  05/19/21  Fact Sheet for Healthcare Providers:  BloggerCourse.com  This test is no t yet approved or cleared by the [place] FDA and  has been authorized for detection and/or diagnosis of SARS-CoV-2 by FDA under an Emergency Use Authorization (EUA). This EUA will remain  in effect (meaning this test can be used) for the duration of the COVID-19 declaration under Section 564(b)(1) of the Act, 21 U.S.C.section 360bbb-3(b)(1), unless the authorization is terminated  or revoked sooner.       Influenza A by PCR NEGATIVE NEGATIVE Final   Influenza B by PCR NEGATIVE NEGATIVE Final    Comment: (NOTE) The Xpert Xpress  SARS-CoV-2/FLU/RSV plus assay is intended as an aid in the diagnosis of influenza from Nasopharyngeal swab specimens and should not be used as a sole basis for treatment. Nasal washings and aspirates are unacceptable for Xpert Xpress SARS-CoV-2/FLU/RSV testing.  Fact Sheet for Patients: [place]  Fact Sheet for Healthcare Providers: BloggerCourse.com  This test is not yet approved or cleared by the [place] FDA and has been authorized for detection and/or diagnosis of SARS-CoV-2 by FDA under an Emergency Use Authorization (EUA). This EUA will remain in effect (meaning this test can be used) for the duration of the COVID-19 declaration under Section 564(b)(1) of the Act, 21 U.S.C. section 360bbb-3(b)(1), unless the authorization is terminated or revoked.  Performed at [facility], [address], [place], [place]   Gastrointestinal Panel by PCR , Stool     Status: Abnormal   Collection Time: 03/19/21 12:00 PM   Specimen: Stool  Result Value Ref Range Status   Campylobacter species NOT DETECTED NOT DETECTED Final   Plesimonas shigelloides NOT DETECTED NOT  DETECTED Final   Salmonella species NOT DETECTED NOT DETECTED Final   Yersinia enterocolitica NOT DETECTED NOT DETECTED Final   Vibrio species NOT DETECTED NOT DETECTED Final   Vibrio cholerae NOT DETECTED NOT DETECTED Final   Enteroaggregative E coli (EAEC) NOT DETECTED NOT DETECTED Final   Enteropathogenic E coli (EPEC) NOT DETECTED NOT DETECTED Final   Enterotoxigenic E coli (ETEC) NOT DETECTED NOT DETECTED Final   Shiga like toxin producing E coli (STEC) NOT DETECTED NOT DETECTED Final   Shigella/Enteroinvasive E coli (EIEC) NOT DETECTED NOT DETECTED Final   Cryptosporidium NOT DETECTED NOT DETECTED Final   Cyclospora cayetanensis NOT DETECTED NOT DETECTED Final   Entamoeba histolytica NOT DETECTED NOT DETECTED Final   Giardia  lamblia NOT DETECTED NOT DETECTED Final   Adenovirus F40/41 NOT DETECTED NOT DETECTED Final   Astrovirus DETECTED (A) NOT DETECTED Final   Norovirus GI/GII NOT DETECTED NOT DETECTED Final   Rotavirus A NOT DETECTED NOT DETECTED Final   Sapovirus (I, II, IV, and V) NOT DETECTED NOT DETECTED Final    Comment: Performed at [facility], [address] [place], [place] [address]  C Difficile Quick Screen w PCR reflex     Status: Abnormal   Collection Time: 03/19/21 12:00 PM   Specimen: Stool  Result Value Ref Range Status   C Diff antigen POSITIVE (A) NEGATIVE Final   C Diff toxin NEGATIVE NEGATIVE Final   C Diff interpretation Results are indeterminate. See PCR results.  Final    Comment: Performed at [facility], [address], [place], [place] [address]  C. Diff by PCR, Reflexed     Status: Abnormal   Collection Time: 03/19/21 12:00 PM  Result Value Ref Range Status   Toxigenic C. Difficile by PCR POSITIVE (A) NEGATIVE Final    Comment: Positive for toxigenic C. difficile with little to no toxin production. Only treat if clinical presentation suggests symptomatic illness. Performed at [facility], [address] [place], [place] [address]      Labs: BNP (last 3 results) No results for input(s): BNP in the last 8760 hours. Basic Metabolic Panel: Recent Labs  Lab 03/19/21 0438 03/20/21 0426  NA 137 142  K 3.2* 3.4*  CL 105 116*  CO2 25 22  GLUCOSE 103* 88  BUN 12 8  CREATININE 0.72 0.72  CALCIUM 8.8* 8.5*  MG 1.8 1.8   Liver Function Tests: Recent Labs  Lab 03/19/21 0438 03/20/21 0426  AST 18 19  ALT 21 22  ALKPHOS 66 59  BILITOT 0.7 0.8  PROT 7.6 6.4*  ALBUMIN 3.9 3.4*   Recent Labs  Lab 03/19/21 0438  LIPASE 35   No results for input(s): AMMONIA in the last 168 hours. CBC: Recent Labs  Lab 03/19/21 0438 03/20/21 0426  WBC 10.2 6.1  HGB 13.3 11.2*  HCT 42.5 35.6*  MCV 83.3 83.6  PLT 324 232   Cardiac  Enzymes: No results for input(s): CKTOTAL, CKMB, CKMBINDEX, TROPONINI in the last 168 hours. BNP: Invalid input(s): POCBNP CBG: No results for input(s): GLUCAP in the last 168 hours. D-Dimer No results for input(s): DDIMER in the last 72 hours. Hgb A1c No results for input(s): HGBA1C in the last 72 hours. Lipid Profile No results for input(s): CHOL, HDL, LDLCALC, TRIG, CHOLHDL, LDLDIRECT in the last 72 hours. Thyroid function studies No results for input(s): TSH, T4TOTAL, T3FREE, THYROIDAB in the last 72 hours.  Invalid input(s): FREET3 Anemia work up No results  for input(s): VITAMINB12, FOLATE, FERRITIN, TIBC, IRON, RETICCTPCT in the last 72 hours. Urinalysis    Component Value Date/Time   COLORURINE YELLOW 03/19/2021 0438   APPEARANCEUR CLOUDY (A) 03/19/2021 0438   APPEARANCEUR Clear 01/18/2014 2152   LABSPEC 1.029 03/19/2021 0438   LABSPEC 1.012 01/18/2014 2152   PHURINE 5.0 03/19/2021 0438   GLUCOSEU NEGATIVE 03/19/2021 0438   GLUCOSEU Negative 01/18/2014 2152   HGBUR NEGATIVE 03/19/2021 0438   BILIRUBINUR NEGATIVE 03/19/2021 0438   BILIRUBINUR Negative 01/18/2014 2152   KETONESUR NEGATIVE 03/19/2021 0438   PROTEINUR 30 (A) 03/19/2021 0438   UROBILINOGEN 0.2 08/22/2020 1137   NITRITE NEGATIVE 03/19/2021 0438   LEUKOCYTESUR TRACE (A) 03/19/2021 0438   LEUKOCYTESUR Trace 01/18/2014 2152   Sepsis Labs Invalid input(s): PROCALCITONIN,  WBC,  LACTICIDVEN Microbiology Recent Results (from the past 240 hour(s))  Resp Panel by RT-PCR (Flu A&B, Covid) Nasopharyngeal Swab     Status: None   Collection Time: 03/19/21 11:33 AM   Specimen: Nasopharyngeal Swab; Nasopharyngeal(NP) swabs in vial transport medium  Result Value Ref Range Status   SARS Coronavirus 2 by RT PCR NEGATIVE NEGATIVE Final    Comment: (NOTE) SARS-CoV-2 target nucleic acids are NOT DETECTED.  The SARS-CoV-2 RNA is generally detectable in upper respiratory specimens during the acute phase of infection.  The lowest concentration of SARS-CoV-2 viral copies this assay can detect is 138 copies/mL. A negative result does not preclude SARS-Cov-2 infection and should not be used as the sole basis for treatment or other patient management decisions. A negative result may occur with  improper specimen collection/handling, submission of specimen other than nasopharyngeal swab, presence of viral mutation(s) within the areas targeted by this assay, and inadequate number of viral copies(<138 copies/mL). A negative result must be combined with clinical observations, patient history, and epidemiological information. The expected result is Negative.  Fact Sheet for Patients:  BloggerCourse.com  Fact Sheet for Healthcare Providers:  [employer]  This test is no t yet approved or cleared by the [place] FDA and  has been authorized for detection and/or diagnosis of SARS-CoV-2 by FDA under an Emergency Use Authorization (EUA). This EUA will remain  in effect (meaning this test can be used) for the duration of the COVID-19 declaration under Section 564(b)(1) of the Act, 21 U.S.C.section 360bbb-3(b)(1), unless the authorization is terminated  or revoked sooner.       Influenza A by PCR NEGATIVE NEGATIVE Final   Influenza B by PCR NEGATIVE NEGATIVE Final    Comment: (NOTE) The Xpert Xpress SARS-CoV-2/FLU/RSV plus assay is intended as an aid in the diagnosis of influenza from Nasopharyngeal swab specimens and should not be used as a sole basis for treatment. Nasal washings and aspirates are unacceptable for Xpert Xpress SARS-CoV-2/FLU/RSV testing.  Fact Sheet for Patients: BloggerCourse.com  Fact Sheet for Healthcare Providers: [employer]  This test is not yet approved or cleared by the [place] FDA and has been authorized for detection and/or diagnosis of SARS-CoV-2 by FDA  under an Emergency Use Authorization (EUA). This EUA will remain in effect (meaning this test can be used) for the duration of the COVID-19 declaration under Section 564(b)(1) of the Act, 21 U.S.C. section 360bbb-3(b)(1), unless the authorization is terminated or revoked.  Performed at [facility], [address], [place], [place] [address]   Gastrointestinal Panel by PCR , Stool     Status: Abnormal   Collection Time: 03/19/21 12:00 PM   Specimen: Stool  Result Value Ref Range Status  Campylobacter species NOT DETECTED NOT DETECTED Final   Plesimonas shigelloides NOT DETECTED NOT DETECTED Final   Salmonella species NOT DETECTED NOT DETECTED Final   Yersinia enterocolitica NOT DETECTED NOT DETECTED Final   Vibrio species NOT DETECTED NOT DETECTED Final   Vibrio cholerae NOT DETECTED NOT DETECTED Final   Enteroaggregative E coli (EAEC) NOT DETECTED NOT DETECTED Final   Enteropathogenic E coli (EPEC) NOT DETECTED NOT DETECTED Final   Enterotoxigenic E coli (ETEC) NOT DETECTED NOT DETECTED Final   Shiga like toxin producing E coli (STEC) NOT DETECTED NOT DETECTED Final   Shigella/Enteroinvasive E coli (EIEC) NOT DETECTED NOT DETECTED Final   Cryptosporidium NOT DETECTED NOT DETECTED Final   Cyclospora cayetanensis NOT DETECTED NOT DETECTED Final   Entamoeba histolytica NOT DETECTED NOT DETECTED Final   Giardia lamblia NOT DETECTED NOT DETECTED Final   Adenovirus F40/41 NOT DETECTED NOT DETECTED Final   Astrovirus DETECTED (A) NOT DETECTED Final   Norovirus GI/GII NOT DETECTED NOT DETECTED Final   Rotavirus A NOT DETECTED NOT DETECTED Final   Sapovirus (I, II, IV, and V) NOT DETECTED NOT DETECTED Final    Comment: Performed at [facility], [address] [place], [place] [address]  C Difficile Quick Screen w PCR reflex     Status: Abnormal   Collection Time: 03/19/21 12:00 PM   Specimen: Stool  Result Value Ref Range Status   C Diff antigen POSITIVE  (A) NEGATIVE Final   C Diff toxin NEGATIVE NEGATIVE Final   C Diff interpretation Results are indeterminate. See PCR results.  Final    Comment: Performed at [facility], [address] [place], [place] [address]  C. Diff by PCR, Reflexed     Status: Abnormal   Collection Time: 03/19/21 12:00 PM  Result Value Ref Range Status   Toxigenic C. Difficile by PCR POSITIVE (A) NEGATIVE Final    Comment: Positive for toxigenic C. difficile with little to no toxin production. Only treat if clinical presentation suggests symptomatic illness. Performed at [facility], [address] [place], [place] [address]      Time coordinating discharge: Over 30 minutes  SIGNED:   Alvira Philips [place], DO  [facility] 03/20/2021, 12:11 PM

## 2021-04-16 ENCOUNTER — Ambulatory Visit (HOSPITAL_COMMUNITY): Payer: Medicaid Other | Admitting: Clinical

## 2021-04-16 ENCOUNTER — Ambulatory Visit (INDEPENDENT_AMBULATORY_CARE_PROVIDER_SITE_OTHER): Payer: Medicaid Other | Admitting: Psychiatry

## 2021-04-16 ENCOUNTER — Encounter (HOSPITAL_COMMUNITY): Payer: Self-pay

## 2021-04-16 DIAGNOSIS — F1210 Cannabis abuse, uncomplicated: Secondary | ICD-10-CM

## 2021-04-16 DIAGNOSIS — F331 Major depressive disorder, recurrent, moderate: Secondary | ICD-10-CM | POA: Insufficient documentation

## 2021-04-16 DIAGNOSIS — F3131 Bipolar disorder, current episode depressed, mild: Secondary | ICD-10-CM | POA: Insufficient documentation

## 2021-04-16 HISTORY — DX: Major depressive disorder, recurrent, moderate: F33.1

## 2021-04-16 MED ORDER — OXCARBAZEPINE 300 MG PO TABS: 300.0000 mg | ORAL_TABLET | Freq: Two times a day (BID) | ORAL | 2 refills | Status: DC

## 2021-04-16 MED ORDER — HYDROXYZINE HCL 10 MG PO TABS: 10.0000 mg | ORAL_TABLET | Freq: Three times a day (TID) | ORAL | 0 refills | Status: DC | PRN
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
## 2021-04-16 NOTE — Progress Notes (Signed)
Please see other note
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
## 2021-04-17 ENCOUNTER — Ambulatory Visit (INDEPENDENT_AMBULATORY_CARE_PROVIDER_SITE_OTHER): Payer: Medicaid Other | Admitting: Family Medicine

## 2021-04-17 ENCOUNTER — Other Ambulatory Visit: Payer: Self-pay

## 2021-04-17 ENCOUNTER — Telehealth: Payer: Self-pay | Admitting: General Practice

## 2021-04-17 ENCOUNTER — Encounter: Payer: Self-pay | Admitting: Family Medicine

## 2021-04-17 ENCOUNTER — Other Ambulatory Visit (HOSPITAL_COMMUNITY)
Admission: RE | Admit: 2021-04-17 | Discharge: 2021-04-17 | Disposition: A | Discharge status: Home / Self Care | Payer: Medicaid Other | Source: Ambulatory Visit | Attending: Family Medicine | Admitting: Family Medicine

## 2021-04-17 VITALS — BP 129/81 | HR 72 | Ht 64.0 in | Wt 286.8 lb

## 2021-04-17 DIAGNOSIS — Z3009 Encounter for other general counseling and advice on contraception: Secondary | ICD-10-CM

## 2021-04-17 DIAGNOSIS — A599 Trichomoniasis, unspecified: Secondary | ICD-10-CM | POA: Diagnosis not present

## 2021-04-17 DIAGNOSIS — T839XXA Unspecified complication of genitourinary prosthetic device, implant and graft, initial encounter: Secondary | ICD-10-CM

## 2021-04-17 HISTORY — DX: Trichomoniasis, unspecified: A59.9
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
## 2021-04-17 NOTE — Telephone Encounter (Signed)
Patient called into front office requesting a callback from a nurse as she has concerns about her IUD.  Called patient and she states she had a normal period 10/21 and started bleeding again yesterday. Patient states the bleeding has increased since then and is similar to a period. She reports pinching feeling in her vagina and just feels uncomfortable like a tampon that is in the wrong spot. Per chart review, patient had CT scan early October that showed IUD in lower uterus. Discussed with patient it sounds like her IUD may be trying to come out. Offered appt at office as soon as she could get here. Patient verbalized understanding & states she is on her way.

## 2021-04-17 NOTE — Progress Notes (Signed)
Patient in with complaints of her IUD. States that she has been having extreme cramping and yesterday started bleeding. States IUD does not feel like it is in the correct area. If possible would like IUD removed, does not want another IUD inserted. Informed of other birth control options, declines any at this time.   Wynona Canes, CMA

## 2021-04-18 MED ORDER — NORGESTIMATE-ETH ESTRADIOL 0.25-35 MG-MCG PO TABS: 1.0000 | ORAL_TABLET | Freq: Every day | ORAL | 11 refills | Status: DC

## 2021-04-18 NOTE — Assessment & Plan Note (Signed)
Test of cure today 

## 2021-04-18 NOTE — Assessment & Plan Note (Addendum)
On exam patient's strings appeared to be much longer than previously documented (3 cm). Based on symptoms and this I told patient it was likely malpositioned, she requested removal but strongly declined insertion of another IUD. Relative contraindication to OCP's with HTN but patient understands risks and this is more likely to be effective than POPs, rx sent to her pharmacy. She will follow up in a few weeks to see how she is feeling and discuss contraception further.

## 2021-04-19 LAB — CERVICOVAGINAL ANCILLARY ONLY
Bacterial Vaginitis (gardnerella): POSITIVE — AB
Candida Glabrata: NEGATIVE
Candida Vaginitis: NEGATIVE
Chlamydia: NEGATIVE
Comment: NEGATIVE
Comment: NEGATIVE
Comment: NEGATIVE
Comment: NEGATIVE
Comment: NEGATIVE
Comment: NORMAL
Neisseria Gonorrhea: NEGATIVE
Trichomonas: POSITIVE — AB

## 2021-04-19 MED ORDER — METRONIDAZOLE 500 MG PO TABS: 500.0000 mg | ORAL_TABLET | Freq: Two times a day (BID) | ORAL | 0 refills | Status: AC

## 2021-04-19 NOTE — Addendum Note (Signed)
Addended by: Merian Capron on: 04/19/2021 02:21 PM   Modules accepted: Orders

## 2021-04-24 ENCOUNTER — Telehealth: Payer: Self-pay | Admitting: Family Medicine

## 2021-04-24 NOTE — Telephone Encounter (Signed)
Calling for a RX for her partner for trichomonas  said Dr.Eckstat would write one for him

## 2021-04-24 NOTE — Telephone Encounter (Signed)
Call returned to pt and obtained partner name, DOB, allergies and pharmacy information.  Pt was advised that Dr. Crissie Reese will e-prescribe medication today. She was reminded that they must wait one full week after they both have completed treatment before having sex.  Pt voiced understanding of all information and instructions given.

## 2021-04-28 ENCOUNTER — Encounter (HOSPITAL_BASED_OUTPATIENT_CLINIC_OR_DEPARTMENT_OTHER): Payer: Self-pay

## 2021-04-28 ENCOUNTER — Other Ambulatory Visit: Payer: Self-pay

## 2021-04-28 ENCOUNTER — Emergency Department (HOSPITAL_BASED_OUTPATIENT_CLINIC_OR_DEPARTMENT_OTHER): Payer: Medicaid Other

## 2021-04-28 ENCOUNTER — Emergency Department (HOSPITAL_BASED_OUTPATIENT_CLINIC_OR_DEPARTMENT_OTHER)
Admission: EM | Admit: 2021-04-28 | Discharge: 2021-04-28 | Disposition: A | Discharge status: Home / Self Care | Payer: Medicaid Other | Attending: Student | Admitting: Student

## 2021-04-28 DIAGNOSIS — R220 Localized swelling, mass and lump, head: Secondary | ICD-10-CM | POA: Diagnosis not present

## 2021-04-28 DIAGNOSIS — Z87891 Personal history of nicotine dependence: Secondary | ICD-10-CM | POA: Insufficient documentation

## 2021-04-28 DIAGNOSIS — J029 Acute pharyngitis, unspecified: Secondary | ICD-10-CM | POA: Diagnosis not present

## 2021-04-28 DIAGNOSIS — L0201 Cutaneous abscess of face: Secondary | ICD-10-CM | POA: Diagnosis not present

## 2021-04-28 DIAGNOSIS — Z8616 Personal history of COVID-19: Secondary | ICD-10-CM | POA: Diagnosis not present

## 2021-04-28 DIAGNOSIS — R519 Headache, unspecified: Secondary | ICD-10-CM | POA: Insufficient documentation

## 2021-04-28 DIAGNOSIS — R21 Rash and other nonspecific skin eruption: Secondary | ICD-10-CM | POA: Diagnosis not present

## 2021-04-28 LAB — CBC WITH DIFFERENTIAL/PLATELET
Abs Immature Granulocytes: 0.03 10*3/uL (ref 0.00–0.07)
Basophils Absolute: 0 10*3/uL (ref 0.0–0.1)
Basophils Relative: 0 %
Eosinophils Absolute: 0.3 10*3/uL (ref 0.0–0.5)
Eosinophils Relative: 3 %
HCT: 39.9 % (ref 36.0–46.0)
Hemoglobin: 12.5 g/dL (ref 12.0–15.0)
Immature Granulocytes: 0 %
Lymphocytes Relative: 16 %
Lymphs Abs: 1.3 10*3/uL (ref 0.7–4.0)
MCH: 26.3 pg (ref 26.0–34.0)
MCHC: 31.3 g/dL (ref 30.0–36.0)
MCV: 84 fL (ref 80.0–100.0)
Monocytes Absolute: 0.5 10*3/uL (ref 0.1–1.0)
Monocytes Relative: 7 %
Neutro Abs: 6.2 10*3/uL (ref 1.7–7.7)
Neutrophils Relative %: 74 %
Platelets: 286 10*3/uL (ref 150–400)
RBC: 4.75 MIL/uL (ref 3.87–5.11)
RDW: 14.7 % (ref 11.5–15.5)
WBC: 8.4 10*3/uL (ref 4.0–10.5)
nRBC: 0 % (ref 0.0–0.2)

## 2021-04-28 LAB — BASIC METABOLIC PANEL
Anion gap: 7 (ref 5–15)
BUN: 10 mg/dL (ref 6–20)
CO2: 26 mmol/L (ref 22–32)
Calcium: 8.3 mg/dL — ABNORMAL LOW (ref 8.9–10.3)
Chloride: 103 mmol/L (ref 98–111)
Creatinine, Ser: 0.61 mg/dL (ref 0.44–1.00)
GFR, Estimated: >60 mL/min (ref >60)
Glucose, Bld: 97 mg/dL (ref 70–99)
Potassium: 3.6 mmol/L (ref 3.5–5.1)
Sodium: 136 mmol/L (ref 135–145)

## 2021-04-28 MED ORDER — ACETAMINOPHEN 500 MG PO TABS
1000.0000 mg | ORAL_TABLET | Freq: Once | ORAL | Status: AC
  Administered 2021-04-28: 1000 mg via ORAL
  Filled 2021-04-28: qty 2

## 2021-04-28 MED ORDER — ONDANSETRON 4 MG PO TBDP
4.0000 mg | ORAL_TABLET | Freq: Once | ORAL | Status: AC
  Administered 2021-04-28: 4 mg via ORAL
  Filled 2021-04-28: qty 1

## 2021-04-28 MED ORDER — KETOROLAC TROMETHAMINE 15 MG/ML IJ SOLN
15.0000 mg | Freq: Once | INTRAMUSCULAR | Status: AC
  Administered 2021-04-28: 15 mg via INTRAVENOUS
  Filled 2021-04-28: qty 1

## 2021-04-28 MED ORDER — AMOXICILLIN-POT CLAVULANATE 875-125 MG PO TABS: 1.0000 | ORAL_TABLET | Freq: Two times a day (BID) | ORAL | 0 refills | Status: AC

## 2021-04-28 MED ORDER — IOHEXOL 300 MG/ML  SOLN
100.0000 mL | Freq: Once | INTRAMUSCULAR | Status: AC | PRN
  Administered 2021-04-28: 100 mL via INTRAVENOUS

## 2021-04-28 NOTE — ED Triage Notes (Signed)
Pt reports sore throat, swollen area right jaw and rash on extremities.   Subjective fever, nausea, denies vomiting. No diarrhea.

## 2021-04-28 NOTE — ED Triage Notes (Signed)
Pt also adds she has been newly prescribed a mood stabilizer one week ago, can not recall name of medication
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
## 2021-04-28 NOTE — Discharge Instructions (Addendum)
You are seen in the emergency department for evaluation of facial swelling.  Your CAT scan showed some mild swelling over the right jaw and an enlarged lymph node on that side.  It is possible that this is related to something called lymphadenitis and we will treat with antibiotics to monitor for improvement.  Please take ibuprofen and Tylenol for your pain.  Please call your primary care physician to monitor for improvement.  Return the emergency department if you have new or worsening swelling, fever, vomiting or any other concerning symptoms.

## 2021-04-28 NOTE — ED Notes (Signed)
EDP at [facility] 

## 2021-05-15 ENCOUNTER — Ambulatory Visit: Payer: Self-pay | Admitting: Family Medicine

## 2021-05-22 ENCOUNTER — Telehealth: Payer: Self-pay | Admitting: Family Medicine

## 2021-05-22 DIAGNOSIS — N898 Other specified noninflammatory disorders of vagina: Secondary | ICD-10-CM

## 2021-05-22 NOTE — Telephone Encounter (Signed)
Patient stated that she had her IUD removed and she still haven't had a cycle and she's not feeling normal in her vaginal area, she want to know if this is normal.

## 2021-05-23 MED ORDER — FLUCONAZOLE 150 MG PO TABS: 150.0000 mg | ORAL_TABLET | Freq: Once | ORAL | 0 refills | Status: AC

## 2021-05-23 NOTE — Addendum Note (Signed)
Addended by: Maxwell Marion E on: 05/23/2021 01:41 PM   Modules accepted: Orders

## 2021-05-23 NOTE — Telephone Encounter (Addendum)
Called pt; VM left requesting patient return call.  Patient returned phone call. Reports finishing Flagyl for trichomonas and Augmentin for tooth infection about 2 weeks ago. Reports vaginal itching with white discharge. Diflucan tablet sent per protocol for likely yeast infection. Patient is concerned that menstrual period has not returned. UPT negative at home. Explained that this is normal and it may take 3-6 months for menstrual cycle to become regular. Pt will follow up at appt with provider on 06/05/21.

## 2021-05-28 ENCOUNTER — Telehealth (INDEPENDENT_AMBULATORY_CARE_PROVIDER_SITE_OTHER): Payer: Medicaid Other | Admitting: Psychiatry

## 2021-05-28 ENCOUNTER — Encounter (HOSPITAL_COMMUNITY): Payer: Self-pay | Admitting: Psychiatry

## 2021-05-28 DIAGNOSIS — F3132 Bipolar disorder, current episode depressed, moderate: Secondary | ICD-10-CM | POA: Diagnosis not present

## 2021-05-28 DIAGNOSIS — F603 Borderline personality disorder: Secondary | ICD-10-CM

## 2021-05-28 HISTORY — DX: Borderline personality disorder: F60.3

## 2021-05-28 MED ORDER — HYDROXYZINE HCL 25 MG PO TABS: 25.0000 mg | ORAL_TABLET | Freq: Three times a day (TID) | ORAL | 0 refills | Status: DC | PRN

## 2021-05-28 MED ORDER — FLUOXETINE HCL 20 MG PO CAPS: 20.0000 mg | ORAL_CAPSULE | Freq: Every day | ORAL | 2 refills | Status: DC
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
## 2021-06-01 ENCOUNTER — Encounter (HOSPITAL_COMMUNITY): Payer: Self-pay

## 2021-06-04 ENCOUNTER — Telehealth (HOSPITAL_COMMUNITY): Payer: Medicaid Other

## 2021-06-05 ENCOUNTER — Other Ambulatory Visit: Payer: Self-pay

## 2021-06-05 ENCOUNTER — Ambulatory Visit (INDEPENDENT_AMBULATORY_CARE_PROVIDER_SITE_OTHER): Payer: Medicaid Other | Admitting: Family Medicine

## 2021-06-05 ENCOUNTER — Encounter: Payer: Self-pay | Admitting: Family Medicine

## 2021-06-05 ENCOUNTER — Other Ambulatory Visit (HOSPITAL_COMMUNITY)
Admission: RE | Admit: 2021-06-05 | Discharge: 2021-06-05 | Disposition: A | Discharge status: Home / Self Care | Payer: Medicaid Other | Source: Ambulatory Visit | Attending: Family Medicine | Admitting: Family Medicine

## 2021-06-05 VITALS — BP 120/74 | HR 65 | Wt 286.4 lb

## 2021-06-05 DIAGNOSIS — R102 Pelvic and perineal pain: Secondary | ICD-10-CM | POA: Diagnosis not present

## 2021-06-05 DIAGNOSIS — A599 Trichomoniasis, unspecified: Secondary | ICD-10-CM | POA: Diagnosis not present

## 2021-06-05 HISTORY — DX: Pelvic and perineal pain: R10.2

## 2021-06-05 NOTE — Assessment & Plan Note (Signed)
Very non specific. Patient w concern re: Multiple myeloma. Reviewed last CBC from a month ago which was completely normal without any cell line abnormalities and reassured patient this is very unlikely. Also had normal CT two months ago with no acute pathology. No clear etiology, possible post op pain syndrome from cesarean, constipation, pelvic infection, UTI. Recommended watchful waiting at this point, if no improvement in 2-3 weeks she should return, will also follow up results of pelvic swab.
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
## 2021-06-05 NOTE — Assessment & Plan Note (Signed)
TOC today

## 2021-06-06 ENCOUNTER — Ambulatory Visit (INDEPENDENT_AMBULATORY_CARE_PROVIDER_SITE_OTHER): Payer: Medicaid Other | Admitting: Clinical

## 2021-06-06 DIAGNOSIS — F3132 Bipolar disorder, current episode depressed, moderate: Secondary | ICD-10-CM

## 2021-06-06 LAB — CERVICOVAGINAL ANCILLARY ONLY
Bacterial Vaginitis (gardnerella): POSITIVE — AB
Candida Glabrata: NEGATIVE
Candida Vaginitis: NEGATIVE
Chlamydia: NEGATIVE
Comment: NEGATIVE
Comment: NEGATIVE
Comment: NEGATIVE
Comment: NEGATIVE
Comment: NEGATIVE
Comment: NORMAL
Neisseria Gonorrhea: NEGATIVE
Trichomonas: POSITIVE — AB

## 2021-06-06 MED ORDER — TINIDAZOLE 500 MG PO TABS: 500.0000 mg | ORAL_TABLET | Freq: Two times a day (BID) | ORAL | 0 refills | Status: AC

## 2021-06-06 NOTE — Progress Notes (Signed)
° °  THERAPIST PROGRESS NOTE Virtual Visit via Video Note  I connected with Alyssa Ray on 06/06/21 at 11:00 AM EST by a video enabled telemedicine application and verified that I am speaking with the correct person using two identifiers.  Location: Patient: home Provider: office   I discussed the limitations of evaluation and management by telemedicine and the availability of in person appointments. The patient expressed understanding and agreed to proceed.   Follow Up Instructions: I discussed the assessment and treatment plan with the patient. The patient was provided an opportunity to ask questions and all were answered. The patient agreed with the plan and demonstrated an understanding of the instructions.   The patient was advised to call back or seek an in-person evaluation if the symptoms worsen or if the condition fails to improve as anticipated.   Session Time: 30 minutes  Participation Level: Active  Behavioral Response: CasualAlertEuthymic  Type of Therapy: Individual Therapy  Treatment Goals addressed: Coping  Interventions: CBT and Supportive  Summary: Alyssa Ray is a 30 y.o. female who presents for the scheduled session oriented times five, appropriately dressed, and friendly. Client denied hallucinations and delusions. Client reported on today that she has been doing fairly well. Client reported since she was last seen she has been working on maintaining a routine for herself.  Client reported she has met with the psychiatrist and maintaining medication compliance.  Client reported having to take her psych medications in the morning has been beneficial for her because it has pushed her to create a morning routine for herself with improved self hygiene practices.  Client reported she feels better for doing it.  Client stated "it makes getting started easier".  Client reported she has to wake up early with her baby anyway.  Client reported she has been also making  changes to her family and interpersonal relationships.  Client reported she has had feelings of loneliness because as she has implemented and boundaries she is understanding that some friendships are not with maintaining.  Client reported that she has always struggled with is saying "no" to others. Client reported people get upset with her when she is not running to get her every need. Client reported certain people do not take into consideration her life and that she has a baby and household that she has to attend to. Client reported she is also working with her fiance on strengthening their relationship to work on progressing together. Client reported otherwise she has been doing well and looking forward to celebrating Christmas with her family.      Suicidal/Homicidal: Nowithout intent/plan  Therapist Response:  Therapist began the session asking the client how she has been doing since last seen. Therapist used CBT to utilize active listening and positive emotional support towards her thoughts and feelings. Therapist used CBT to engage with the client to ask her how she is coping with interpersonal and family relationships. Therapist used CBT to ask the client to identify her new way of thinking as she makes changes with boundaries and communication. Therapist used CBT to reinforce the clients positive changes. Therapist assigned client homework to continue implementing her daily routine/self-care regimen and enforcing appropriate and healthy boundaries. Client was scheduled for next appointment.   Plan: Return again in 5 weeks.  Diagnosis: Bipolar affective disorder, currently depressed, moderate  Alyssa Jubilee Bellami Farrelly, LCSW 06/06/2021

## 2021-06-06 NOTE — Addendum Note (Signed)
Addended by: Merian Capron on: 06/06/2021 03:28 PM   Modules accepted: Orders

## 2021-06-13 ENCOUNTER — Telehealth: Payer: Self-pay | Admitting: Lactation Services

## 2021-06-13 NOTE — Telephone Encounter (Signed)
Called patient and she was aware of + Trich and ATB sent to pharmacy. She has picked up ATB and are taking.

## 2021-07-03 ENCOUNTER — Telehealth (HOSPITAL_COMMUNITY): Payer: Medicaid Other | Admitting: Psychiatry

## 2021-07-04 ENCOUNTER — Emergency Department (HOSPITAL_COMMUNITY)
Admission: EM | Admit: 2021-07-04 | Discharge: 2021-07-05 | Disposition: A | Discharge status: Home / Self Care | Payer: Medicaid Other | Attending: Emergency Medicine | Admitting: Emergency Medicine

## 2021-07-04 DIAGNOSIS — W274XXA Contact with kitchen utensil, initial encounter: Secondary | ICD-10-CM | POA: Diagnosis not present

## 2021-07-04 DIAGNOSIS — S61212A Laceration without foreign body of right middle finger without damage to nail, initial encounter: Secondary | ICD-10-CM | POA: Diagnosis not present

## 2021-07-04 DIAGNOSIS — Z23 Encounter for immunization: Secondary | ICD-10-CM | POA: Insufficient documentation

## 2021-07-04 DIAGNOSIS — S6991XA Unspecified injury of right wrist, hand and finger(s), initial encounter: Secondary | ICD-10-CM | POA: Diagnosis present

## 2021-07-05 ENCOUNTER — Encounter (HOSPITAL_COMMUNITY): Payer: Self-pay | Admitting: *Deleted

## 2021-07-05 ENCOUNTER — Other Ambulatory Visit: Payer: Self-pay

## 2021-07-05 MED ORDER — TETANUS-DIPHTH-ACELL PERTUSSIS 5-2.5-18.5 LF-MCG/0.5 IM SUSY
0.5000 mL | PREFILLED_SYRINGE | Freq: Once | INTRAMUSCULAR | Status: AC
  Administered 2021-07-05: 0.5 mL via INTRAMUSCULAR
  Filled 2021-07-05: qty 0.5

## 2021-07-05 NOTE — ED Provider Notes (Signed)
[facility] EMERGENCY DEPARTMENT Provider Note   CSN: [id number] Arrival date & time: 07/04/21  2358     History  Chief Complaint  Patient presents with   Laceration    Alyssa Ray is a 31 y.o. female.  HPI Patient is a 31 year old female who presents to the emergency department due to a laceration of the right middle finger.  This occurred prior to arrival.  She was using a slicer which slipped and caused the cut.  She states that she was having difficulty controlling the bleeding so she came to the emergency department for further evaluation.  She is unsure of the timing of her last Tdap.  No numbness.  Home Medications Prior to Admission medications   Medication Sig Start Date End Date Taking? Authorizing Provider  albuterol (VENTOLIN HFA) 108 (90 Base) MCG/ACT inhaler Inhale 2 puffs into the lungs every 4 (four) hours as needed for wheezing. 01/10/21   [provider]  FLUoxetine (PROZAC) 20 MG capsule Take 1 capsule (20 mg total) by mouth daily. 05/28/21 05/28/22  Charm Rings, NP  hydrOXYzine (ATARAX) 25 MG tablet Take 1 tablet (25 mg total) by mouth 3 (three) times daily as needed for anxiety. 05/28/21   Charm Rings, NP      Allergies    Hydrocodone-acetaminophen, Nickel, and Tramadol    Review of Systems   Review of Systems  Musculoskeletal:  Positive for arthralgias and myalgias.  Skin:  Positive for wound.  Neurological:  Negative for weakness and numbness.   Physical Exam Updated Vital Signs BP 127/84    Pulse 74    Temp 98.3 F (36.8 C) (Oral)    Resp 16    Ht 5\' 4"  (1.626 m)    Wt 129.9 kg    LMP 07/02/2021    SpO2 100%    BMI 49.16 kg/m  Physical Exam Vitals and nursing note reviewed.  Constitutional:      General: She is not in acute distress.    Appearance: She is well-developed.  HENT:     Head: Normocephalic and atraumatic.     Right Ear: External ear normal.     Left Ear: External ear normal.  Eyes:     General:  No scleral icterus.       Right eye: No discharge.        Left eye: No discharge.     Conjunctiva/sclera: Conjunctivae normal.  Neck:     Trachea: No tracheal deviation.  Cardiovascular:     Rate and Rhythm: Normal rate.  Pulmonary:     Effort: Pulmonary effort is normal. No respiratory distress.     Breath sounds: No stridor.  Abdominal:     General: There is no distension.  Musculoskeletal:        General: Tenderness present. No swelling or deformity.     Cervical back: Neck supple.     Comments: Right hand: 0.5 cm superficial laceration to the dorsal aspect of the middle finger overlying the DIP joint.  Small amount of bleeding noted which improved with direct pressure.  Skin:    General: Skin is warm and dry.     Findings: No rash.  Neurological:     Mental Status: She is alert.     Cranial Nerves: Cranial nerve deficit: no gross deficits.    ED Results / Procedures / Treatments   Labs (all labs ordered are listed, but only abnormal results are displayed) Labs Reviewed - No data to display  EKG None  Radiology No results found.  Procedures Procedures    Medications Ordered in ED Medications  Tdap (BOOSTRIX) injection 0.5 mL (has no administration in time range)    ED Course/ Medical Decision Making/ A&P                           Medical Decision Making Risk Prescription drug management.   Patient is a 30 year old female who presents to the emergency department due to a laceration to the right middle finger that occurred just prior to arrival.  Patient states she was using a meat slicer which accidentally slipped causing the cut.  On my exam patient has a small superficial wound on the dorsal aspect of the right middle finger overlying the DIP.  Full range of motion of the finger.  Distal sensation intact.  Good cap refill.  Does not appear amenable to sutures.  Bleeding appears controlled with direct pressure.  Patient unsure of the timing of her last Tdap  so this was updated in the emergency department.  Feel that she is stable for discharge at this time and she is agreeable.  We discussed return precautions in length.  Her questions were answered and she was amicable at the time of discharge.  Final Clinical Impression(s) / ED Diagnoses Final diagnoses:  Laceration of right middle finger without damage to nail, foreign body presence unspecified, initial encounter   Rx / DC Orders ED Discharge Orders     None         Placido Sou, PA-C 07/05/21 0035    Tilden Fossa, MD 07/05/21 [phone]

## 2021-07-05 NOTE — ED Triage Notes (Signed)
The pt sliced her rt middle finger on a slicer several hours ago  the wound will not stop bleeding

## 2021-07-05 NOTE — Discharge Instructions (Signed)
Like we discussed, please apply triple antibiotic to the wound 1-2 times per day.  If you develop any new or worsening symptoms such as difficulty controlling bleeding, redness, swelling, fevers, discharge from the wound, vomiting, or any other generally worsening symptoms, please come back to the emergency department immediately for reevaluation.

## 2021-07-09 ENCOUNTER — Telehealth (HOSPITAL_COMMUNITY): Payer: Medicaid Other | Admitting: Psychiatry

## 2021-07-10 ENCOUNTER — Encounter (HOSPITAL_COMMUNITY): Payer: Self-pay | Admitting: Psychiatry

## 2021-07-10 ENCOUNTER — Telehealth (INDEPENDENT_AMBULATORY_CARE_PROVIDER_SITE_OTHER): Payer: Medicaid Other | Admitting: Psychiatry

## 2021-07-10 DIAGNOSIS — F603 Borderline personality disorder: Secondary | ICD-10-CM

## 2021-07-10 DIAGNOSIS — F331 Major depressive disorder, recurrent, moderate: Secondary | ICD-10-CM | POA: Diagnosis not present

## 2021-07-10 MED ORDER — FLUOXETINE HCL 40 MG PO CAPS: 40.0000 mg | ORAL_CAPSULE | Freq: Every day | ORAL | 2 refills | Status: DC

## 2021-07-10 MED ORDER — HYDROXYZINE HCL 25 MG PO TABS: 25.0000 mg | ORAL_TABLET | Freq: Three times a day (TID) | ORAL | 2 refills | Status: DC | PRN
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
## 2021-07-17 ENCOUNTER — Other Ambulatory Visit: Payer: Self-pay

## 2021-07-17 ENCOUNTER — Ambulatory Visit (INDEPENDENT_AMBULATORY_CARE_PROVIDER_SITE_OTHER): Payer: Medicaid Other | Admitting: Obstetrics and Gynecology

## 2021-07-17 ENCOUNTER — Encounter: Payer: Self-pay | Admitting: Obstetrics and Gynecology

## 2021-07-17 ENCOUNTER — Other Ambulatory Visit (HOSPITAL_COMMUNITY)
Admission: RE | Admit: 2021-07-17 | Discharge: 2021-07-17 | Disposition: A | Discharge status: Home / Self Care | Payer: Medicaid Other | Source: Ambulatory Visit | Attending: Obstetrics and Gynecology | Admitting: Obstetrics and Gynecology

## 2021-07-17 VITALS — BP 129/80 | HR 70 | Wt 295.0 lb

## 2021-07-17 DIAGNOSIS — Z8619 Personal history of other infectious and parasitic diseases: Secondary | ICD-10-CM | POA: Insufficient documentation

## 2021-07-17 DIAGNOSIS — N939 Abnormal uterine and vaginal bleeding, unspecified: Secondary | ICD-10-CM | POA: Diagnosis not present

## 2021-07-17 DIAGNOSIS — N643 Galactorrhea not associated with childbirth: Secondary | ICD-10-CM

## 2021-07-17 HISTORY — DX: Galactorrhea not associated with childbirth: N64.3

## 2021-07-17 LAB — POCT PREGNANCY, URINE: Preg Test, Ur: NEGATIVE
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
## 2021-07-17 NOTE — Progress Notes (Signed)
Patient complains of having 2 periods in one month   First period: 12-16  Second period: 61- currently   Patient also states that she has been feeling "engorged" in breast and when she squeezes them "milk comes out" Ms. Heidebrink stated that she stopped breast feeding about 5-6 months ago

## 2021-07-18 LAB — CBC
Hematocrit: 38.5 % (ref 34.0–46.6)
Hemoglobin: 12 g/dL (ref 11.1–15.9)
MCH: 26.1 pg — ABNORMAL LOW (ref 26.6–33.0)
MCHC: 31.2 g/dL — ABNORMAL LOW (ref 31.5–35.7)
MCV: 84 fL (ref 79–97)
Platelets: 277 10*3/uL (ref 150–450)
RBC: 4.6 x10E6/uL (ref 3.77–5.28)
RDW: 14.4 % (ref 11.7–15.4)
WBC: 10.4 10*3/uL (ref 3.4–10.8)

## 2021-07-18 LAB — CERVICOVAGINAL ANCILLARY ONLY
Bacterial Vaginitis (gardnerella): NEGATIVE
Candida Glabrata: NEGATIVE
Candida Vaginitis: NEGATIVE
Chlamydia: NEGATIVE
Comment: NEGATIVE
Comment: NEGATIVE
Comment: NEGATIVE
Comment: NEGATIVE
Comment: NEGATIVE
Comment: NORMAL
Neisseria Gonorrhea: NEGATIVE
Trichomonas: NEGATIVE

## 2021-07-18 LAB — BETA HCG QUANT (REF LAB): hCG Quant: <1 m[IU]/mL

## 2021-07-18 LAB — PROLACTIN: Prolactin: 11.2 ng/mL (ref 4.8–23.3)

## 2021-07-18 LAB — TSH: TSH: 1.3 u[IU]/mL (ref 0.450–4.500)

## 2021-07-23 ENCOUNTER — Ambulatory Visit (HOSPITAL_COMMUNITY): Payer: Medicaid Other

## 2021-08-13 ENCOUNTER — Ambulatory Visit: Payer: Medicaid Other | Admitting: Physician Assistant

## 2021-08-15 ENCOUNTER — Encounter (HOSPITAL_COMMUNITY): Payer: Self-pay

## 2021-08-15 ENCOUNTER — Other Ambulatory Visit: Payer: Self-pay

## 2021-08-15 ENCOUNTER — Encounter (HOSPITAL_COMMUNITY): Payer: Self-pay | Admitting: *Deleted

## 2021-08-15 ENCOUNTER — Emergency Department (HOSPITAL_COMMUNITY)
Admission: EM | Admit: 2021-08-15 | Discharge: 2021-08-16 | Disposition: A | Discharge status: Home / Self Care | Payer: Medicaid Other | Source: Home / Self Care | Attending: Emergency Medicine | Admitting: Emergency Medicine

## 2021-08-15 ENCOUNTER — Emergency Department (HOSPITAL_COMMUNITY)
Admission: EM | Admit: 2021-08-15 | Discharge: 2021-08-15 | Disposition: A | Discharge status: Home / Self Care | Payer: Medicaid Other | Attending: Emergency Medicine | Admitting: Emergency Medicine

## 2021-08-15 ENCOUNTER — Emergency Department (HOSPITAL_COMMUNITY): Payer: Medicaid Other

## 2021-08-15 DIAGNOSIS — M62838 Other muscle spasm: Secondary | ICD-10-CM

## 2021-08-15 DIAGNOSIS — M542 Cervicalgia: Secondary | ICD-10-CM | POA: Insufficient documentation

## 2021-08-15 DIAGNOSIS — E876 Hypokalemia: Secondary | ICD-10-CM | POA: Insufficient documentation

## 2021-08-15 DIAGNOSIS — D72829 Elevated white blood cell count, unspecified: Secondary | ICD-10-CM | POA: Insufficient documentation

## 2021-08-15 DIAGNOSIS — R700 Elevated erythrocyte sedimentation rate: Secondary | ICD-10-CM | POA: Insufficient documentation

## 2021-08-15 DIAGNOSIS — H539 Unspecified visual disturbance: Secondary | ICD-10-CM | POA: Insufficient documentation

## 2021-08-15 DIAGNOSIS — R519 Headache, unspecified: Secondary | ICD-10-CM | POA: Insufficient documentation

## 2021-08-15 LAB — CBC WITH DIFFERENTIAL/PLATELET
Abs Immature Granulocytes: 0.05 10*3/uL (ref 0.00–0.07)
Basophils Absolute: 0.1 10*3/uL (ref 0.0–0.1)
Basophils Relative: 1 %
Eosinophils Absolute: 0.4 10*3/uL (ref 0.0–0.5)
Eosinophils Relative: 3 %
HCT: 39.2 % (ref 36.0–46.0)
Hemoglobin: 12.2 g/dL (ref 12.0–15.0)
Immature Granulocytes: 0 %
Lymphocytes Relative: 23 %
Lymphs Abs: 2.7 10*3/uL (ref 0.7–4.0)
MCH: 26.4 pg (ref 26.0–34.0)
MCHC: 31.1 g/dL (ref 30.0–36.0)
MCV: 84.8 fL (ref 80.0–100.0)
Monocytes Absolute: 0.6 10*3/uL (ref 0.1–1.0)
Monocytes Relative: 5 %
Neutro Abs: 8.1 10*3/uL — ABNORMAL HIGH (ref 1.7–7.7)
Neutrophils Relative %: 68 %
Platelets: 278 10*3/uL (ref 150–400)
RBC: 4.62 MIL/uL (ref 3.87–5.11)
RDW: 14.6 % (ref 11.5–15.5)
WBC: 12 10*3/uL — ABNORMAL HIGH (ref 4.0–10.5)
nRBC: 0 % (ref 0.0–0.2)

## 2021-08-15 LAB — BASIC METABOLIC PANEL
Anion gap: 7 (ref 5–15)
BUN: 12 mg/dL (ref 6–20)
CO2: 26 mmol/L (ref 22–32)
Calcium: 9.1 mg/dL (ref 8.9–10.3)
Chloride: 106 mmol/L (ref 98–111)
Creatinine, Ser: 0.66 mg/dL (ref 0.44–1.00)
GFR, Estimated: >60 mL/min (ref >60)
Glucose, Bld: 96 mg/dL (ref 70–99)
Potassium: 3.3 mmol/L — ABNORMAL LOW (ref 3.5–5.1)
Sodium: 139 mmol/L (ref 135–145)

## 2021-08-15 LAB — SEDIMENTATION RATE: Sed Rate: 27 mm/hr — ABNORMAL HIGH (ref 0–22)

## 2021-08-15 LAB — I-STAT BETA HCG BLOOD, ED (MC, WL, AP ONLY): I-stat hCG, quantitative: <5 m[IU]/mL (ref <5)

## 2021-08-15 MED ORDER — DIAZEPAM 5 MG/ML IJ SOLN
5.0000 mg | Freq: Once | INTRAMUSCULAR | Status: AC
  Administered 2021-08-15: 5 mg via INTRAVENOUS
  Filled 2021-08-15: qty 2

## 2021-08-15 MED ORDER — LIDOCAINE 5 % EX PTCH
1.0000 | MEDICATED_PATCH | Freq: Once | CUTANEOUS | Status: DC
  Administered 2021-08-15: 1 via TRANSDERMAL
  Filled 2021-08-15: qty 1

## 2021-08-15 MED ORDER — MORPHINE SULFATE (PF) 4 MG/ML IV SOLN
4.0000 mg | Freq: Once | INTRAVENOUS | Status: AC
  Administered 2021-08-15: 4 mg via INTRAVENOUS
  Filled 2021-08-15: qty 1

## 2021-08-15 MED ORDER — METHOCARBAMOL 500 MG PO TABS
750.0000 mg | ORAL_TABLET | Freq: Once | ORAL | Status: AC
  Administered 2021-08-16: 750 mg via ORAL
  Filled 2021-08-15: qty 2

## 2021-08-15 MED ORDER — KETOROLAC TROMETHAMINE 30 MG/ML IJ SOLN
30.0000 mg | Freq: Once | INTRAMUSCULAR | Status: AC
  Administered 2021-08-15: 30 mg via INTRAVENOUS
  Filled 2021-08-15: qty 1

## 2021-08-15 MED ORDER — CYCLOBENZAPRINE HCL 10 MG PO TABS: 10.0000 mg | ORAL_TABLET | Freq: Three times a day (TID) | ORAL | 0 refills | Status: DC | PRN

## 2021-08-15 MED ORDER — KETOROLAC TROMETHAMINE 30 MG/ML IJ SOLN
30.0000 mg | Freq: Once | INTRAMUSCULAR | Status: AC
  Administered 2021-08-15: 30 mg via INTRAMUSCULAR
  Filled 2021-08-15: qty 1

## 2021-08-15 MED ORDER — KETOROLAC TROMETHAMINE 30 MG/ML IJ SOLN: 30.0000 mg | Freq: Once | INTRAMUSCULAR | Status: DC

## 2021-08-15 MED ORDER — ONDANSETRON HCL 4 MG/2ML IJ SOLN
4.0000 mg | Freq: Once | INTRAMUSCULAR | Status: AC
  Administered 2021-08-16: 4 mg via INTRAVENOUS
  Filled 2021-08-15: qty 2

## 2021-08-15 NOTE — ED Triage Notes (Signed)
Pt seen last night for post neck and head pain, given muscle relaxer, today some  change in vision seeing gray and black now with flurries.  ?

## 2021-08-15 NOTE — ED Triage Notes (Signed)
Pt reports having a stiff neck that extends into her head x 1 week.  ?

## 2021-08-15 NOTE — ED Provider Notes (Signed)
[facility]-EMERGENCY DEPT Provider Note   CSN: [id number] Arrival date & time: 08/15/21  1858     History  Chief Complaint  Patient presents with   Neck and Head pain with Visual changes    Alyssa Ray is a 31 y.o. female with a history of bipolar type I disorder, MDD, ADHD, and borderline personality disorder presenting today with continued head neck pain.  Was seen in the ED within the last 24 hours for this complaint, was provided Flexeril, which patient says did not help.  Pain initially began 7 to 10 days ago and has been constant since then.  Pain described as worsening, sharp, and worse on the left side of her neck.  This is accompanied with stiffness and is worsened with lying down or putting strain on the neck muscles.  Since recent discharge, now endorses vision changes described as seeing white and black specks, and/or the entire field of vision changing to black gray or white.  Denies recent infection or illness.  Denies fever, nausea, vomiting, diarrhea, constipation, or abdominal pain.  Denies recent contact with persons of similar symptoms.  Denies dizziness or lightheadedness.  The history is provided by the patient and medical records.      Home Medications Prior to Admission medications   Medication Sig Start Date End Date Taking? Authorizing Provider  albuterol (VENTOLIN HFA) 108 (90 Base) MCG/ACT inhaler Inhale 2 puffs into the lungs every 4 (four) hours as needed for wheezing. 01/10/21   [provider]  FLUoxetine (PROZAC) 40 MG capsule Take 1 capsule (40 mg total) by mouth daily. 07/10/21 07/10/22  Charm Rings, NP  hydrOXYzine (ATARAX) 25 MG tablet Take 1 tablet (25 mg total) by mouth 3 (three) times daily as needed for anxiety. 07/10/21   Charm Rings, NP      Allergies    Hydrocodone-acetaminophen, Nickel, and Tramadol    Review of Systems   Review of Systems  Eyes:  Positive for visual disturbance.  Musculoskeletal:   Positive for neck pain and neck stiffness.  Neurological:  Positive for headaches.   Physical Exam Updated Vital Signs BP 130/80    Pulse 75    Temp 97.7 F (36.5 C) (Oral)    Resp 19    Ht 5\' 4"  (1.626 m)    Wt 127 kg    SpO2 99%    BMI 48.06 kg/m  Physical Exam Vitals and nursing note reviewed.  Constitutional:      General: She is not in acute distress.    Appearance: Normal appearance. She is well-developed. She is obese. She is not ill-appearing or diaphoretic.  HENT:     Head: Normocephalic and atraumatic.     Nose: Nose normal.     Mouth/Throat:     Mouth: Mucous membranes are moist.     Pharynx: Oropharynx is clear.  Eyes:     General: No visual field deficit or scleral icterus.    Extraocular Movements: Extraocular movements intact.     Conjunctiva/sclera: Conjunctivae normal.     Pupils: Pupils are equal, round, and reactive to light.  Neck:     Trachea: Trachea and phonation normal.     Meningeal: Brudzinski's sign: Indeterminate. Kernig's sign: Indeterminate.      Comments: No cervical bony tenderness Pain elicited with shrugging motion Left neck TTP as indicated above Reduced passive ROM of the neck with lateral flexion, cervical rotation, flexion and extension, especially regarding the left side Hyperextension observed to elicit  mod-severe pain Limited assessment for active ROM due to significant tenderness elicited Cardiovascular:     Rate and Rhythm: Normal rate and regular rhythm.     Pulses: Normal pulses.     Heart sounds: Normal heart sounds. No murmur heard. Pulmonary:     Effort: Pulmonary effort is normal. No respiratory distress.     Breath sounds: Normal breath sounds.  Abdominal:     General: Bowel sounds are normal.     Palpations: Abdomen is soft.     Tenderness: There is no abdominal tenderness.  Musculoskeletal:        General: No swelling.     Cervical back: Rigidity present. No edema, erythema, signs of trauma, torticollis or crepitus.  Pain with movement present. No spinous process tenderness.     Right lower leg: No edema.     Left lower leg: No edema.  Lymphadenopathy:     Cervical: No cervical adenopathy.  Skin:    General: Skin is warm and dry.     Capillary Refill: Capillary refill takes less than 2 seconds.  Neurological:     General: No focal deficit present.     Mental Status: She is alert and oriented to person, place, and time.     GCS: GCS eye subscore is 4. GCS verbal subscore is 5. GCS motor subscore is 6.     Cranial Nerves: No dysarthria or facial asymmetry.     Sensory: Sensation is intact. No sensory deficit.     Motor: No weakness.     Comments: CN XI difficult to assess due to significant tenderness elicited during exam   Psychiatric:        Mood and Affect: Mood normal.    ED Results / Procedures / Treatments   Labs (all labs ordered are listed, but only abnormal results are displayed) Labs Reviewed  BASIC METABOLIC PANEL - Abnormal; Notable for the following components:      Result Value   Potassium 3.3 (*)    All other components within normal limits  SEDIMENTATION RATE - Abnormal; Notable for the following components:   Sed Rate 27 (*)    All other components within normal limits  CBC WITH DIFFERENTIAL/PLATELET - Abnormal; Notable for the following components:   WBC 12.0 (*)    Neutro Abs 8.1 (*)    All other components within normal limits  C-REACTIVE PROTEIN  I-STAT BETA HCG BLOOD, ED ([facility], [facility], [facility] ONLY)    EKG None  Radiology CT Head Wo Contrast  Result Date: 08/15/2021 CLINICAL DATA:  Headache, new or worsening, neuro deficit (Age 4-49y) Assess for intracranial HTN EXAM: CT HEAD WITHOUT CONTRAST TECHNIQUE: Contiguous axial images were obtained from the base of the skull through the vertex without intravenous contrast. RADIATION DOSE REDUCTION: This exam was performed according to the departmental dose-optimization program which includes automated exposure control, adjustment of  the mA and/or kV according to patient size and/or use of iterative reconstruction technique. COMPARISON:  None. FINDINGS: Brain: No evidence of large-territorial acute infarction. No parenchymal hemorrhage. No mass lesion. No extra-axial collection. No mass effect or midline shift. No hydrocephalus. Basilar cisterns are patent. Vascular: No hyperdense vessel. Skull: No acute fracture or focal lesion. Sinuses/Orbits: Bilateral ethmoid and left frontal mucosal thickening. Right maxillary mucosal thickening. Otherwise paranasal sinuses and mastoid air cells are clear. The orbits are unremarkable. Other: None. IMPRESSION: No acute intracranial abnormality. Electronically Signed   By: Tish Frederickson M.D.   On: 08/15/2021 21:35  Procedures Procedures    Medications Ordered in ED Medications  morphine (PF) 4 MG/ML injection 4 mg (has no administration in time range)  ondansetron (ZOFRAN) injection 4 mg (has no administration in time range)  methocarbamol (ROBAXIN) tablet 750 mg (has no administration in time range)  lidocaine (LIDODERM) 5 % 1 patch (has no administration in time range)  ketorolac (TORADOL) 30 MG/ML injection 30 mg (30 mg Intravenous Given 08/15/21 2113)  diazepam (VALIUM) injection 5 mg (5 mg Intravenous Given 08/15/21 2113)    ED Course/ Medical Decision Making/ A&P                           Medical Decision Making Amount and/or Complexity of Data Reviewed External Data Reviewed: notes. Labs: ordered. Decision-making details documented in ED Course. Radiology: ordered and independent interpretation performed. Decision-making details documented in ED Course.  Risk OTC drugs. Prescription drug management.   31 y.o. female presents to the ED for concern of Neck and Head pain with Visual changes .  This involves an extensive number of treatment options, and is a complaint that carries with it a high risk of complications and morbidity.  The differential diagnosis includes  headache, intracranial hypertension, musculoskeletal strain, torticollis  Comorbidities that complicate the patient evaluation include bipolar disorder, borderline personality disorder, major depressive disorder  Additional history obtained from internal/external records available via epic  Interpretation: I ordered, and personally interpreted labs.  The pertinent results include: Electrolytes unremarkable with the exception of potassium which is 3.3 and mildly decreased.  Kidney function preserved and adequate.  No evidence of anemia on labs, but mild elevated WBC at 12.0 and mildly elevated sed rate at 27.  This could be related to infection or inflammation.    I ordered imaging studies including CT of the head.  I independently visualized and interpreted imaging which showed no acute intracranial process or pathology.  I agree with the radiologist interpretation  Intervention: I ordered medication including diazepam and Toradol for pain relief.  Reevaluation of the patient after these medicines showed that the patient was more relaxed with a little bit less pain, but pain was not fully resolved.  I have reviewed the patients home medicines and have made adjustments as needed  ED Course: Patient appears in moderate pain and occasionally tearful on exam.  No bony tenderness observed on physical exam.  Neck range of motion affected as described above.  Considered bony fracture, but history and imaging not supportive.  Considered acute trauma or intracranial bleed, but again history and imaging not supportive.  Consider torticollis, on physical exam and history not supportive.  Considered possible intracranial hypertension, as the history and presentation may explain her symptoms, unable to rule out with certainty.  But patient is not hypertensive at this time, and vision changes or other symptoms are not consistent with intracranial hypertension.  Considered meningitis due to neck stiffness, pain,  mildly elevated WBC, but patient is afebrile, coherent, and is not tachycardic.  In addition the patient's symptoms have been ongoing for over a week.  Patient has not had any recent illnesses or infections.  Overall, I am uncertain the exact etiology of the patient's symptoms.  However, I suspect that she is currently experiencing a medical, surgical, or psychiatric emergency.  Disposition: Care transferred to oncoming provider who will follow up on remaining work-up and will determine disposition.        Final Clinical Impression(s) / ED Diagnoses Final  diagnoses:  Neck pain on left side    Rx / DC Orders ED Discharge Orders     None         Sandrea HammondCockerham, Geramy Lamorte M, PA-C 08/15/21 2314    Gwyneth SproutPlunkett, [place], MD 08/16/21 [phone]

## 2021-08-15 NOTE — Discharge Instructions (Addendum)
?  Continue to apply ice and heat.  Take muscle relaxer as directed.  I would recommend seeing a massage therapist, physical therapist, or a sports medicine doctor.  I have listed the contact information for sports medicine doctor on this discharge handout. ?

## 2021-08-15 NOTE — Discharge Instructions (Addendum)
To help with pain and spasm please take naproxen and Valium twice daily.  Valium can cause drowsiness, do not drive when taking this medication.  You can also use over-the-counter Salonpas lidocaine patches (blue and silver box) these can be worn every 12 hours, do not apply heat while wearing lidocaine patch but after you remove the patch you can apply heat to the area and then do some gentle range of motion exercises with your neck. ? ?You have been provided the contact information for a family care practice in the area.  Please call to schedule an appointment to establish care in the next 3 to 5 days. ? ?Return to the ED for new or worsening symptoms as discussed. ?

## 2021-08-15 NOTE — ED Provider Notes (Signed)
?[facility]-EMERGENCY DEPT ?[facility] Emergency Department ?Provider Note ?MRN:  [id number]  ?Arrival date & time: 08/15/21    ? ?Chief Complaint   ?Torticollis ?  ?History of Present Illness   ?Alyssa Ray is a 31 y.o. year-old female presents to the ED with chief complaint of bilateral neck pain that has been gradually worsening over the past week.  She states that she first noticed symptoms after awakening from sleep about a week ago.  She states that she has tried to massage the area, but states that it is too painful to have a good massage.  She denies chills.  She states that she is able to move her head neck, but it feels tight.  She denies numbness or weakness of her extremities.  Denies successful treatments prior to arrival. ? ?History provided by patient. ? ?Review of Systems  ?Pertinent review of systems noted in HPI.  ? ? ?Physical Exam  ? ?Vitals:  ? 08/15/21 0033  ?BP: (!) 149/100  ?Pulse: 66  ?Resp: 16  ?Temp: 98.1 ?F (36.7 ?C)  ?SpO2: 100%  ?  ?CONSTITUTIONAL:  well-appearing, NAD ?NEURO:  Alert and oriented x 3, CN 3-12 grossly intact ?EYES:  eyes equal and reactive ?ENT/NECK:  Supple, no stridor  ?CARDIO:  appears well-perfused  ?PULM:  No respiratory distress ?GI/GU:  non-distended,  ?MSK/SPINE:  No gross deformities, no edema, moves all extremities, TTP of bilateral posterior neck and occiput ?SKIN:  no rash, atraumatic ? ? ?*Additional and/or pertinent findings included in MDM below ? ?Diagnostic and Interventional Summary  ? ? EKG Interpretation ? ?Date/Time:    ?Ventricular Rate:    ?PR Interval:    ?QRS Duration:   ?QT Interval:    ?QTC Calculation:   ?R Axis:     ?Text Interpretation:   ?  ? ?  ? ?Labs Reviewed - No data to display  ?No orders to display  ?  ?Medications  ?ketorolac (TORADOL) 30 MG/ML injection 30 mg (30 mg Intramuscular Given 08/15/21 0101)  ?  ? ?Procedures  /  Critical Care ?Procedures ? ?ED Course and Medical Decision Making  ?I have reviewed the triage vital signs,  the nursing notes, and pertinent available records from the EMR. ? ?Complexity of Problems Addressed: ?Low Complexity: Acute, uncomplicated illness or injury requiring no diagnostic workup ? ?ED Course: ?After considering the following differential, muscle strain, occipital neurolagia, torticollis, I ordered toradol IM for muscle soreness and pain. ? ? ?  ? ?Social Determinants Affecting Care: ? ? ? ? ?Consultants: ?No consultations were needed in caring for this patient. ? ?Treatment and Plan: ?Will treat with toradol IM and flexeril with outpatient sports medicine follow-up. ? ?Emergency department workup does not suggest an emergent condition requiring admission or immediate intervention beyond  what has been performed at this time. The patient is safe for discharge and has  been instructed to return immediately for worsening symptoms, change in  symptoms or any other concerns ? ? ? ?Final Clinical Impressions(s) / ED Diagnoses  ? ?  ICD-10-CM   ?1. Muscle spasms of neck  M62.838   ?  ?  ?ED Discharge Orders   ? ?      Ordered  ?  cyclobenzaprine (FLEXERIL) 10 MG tablet  3 times daily PRN       ? 08/15/21 0101  ? ?  ?  ? ?  ?  ? ? ?Discharge Instructions Discussed with and Provided to Patient:  ? ? ?Discharge Instructions   ? ?  ? ?  Continue to apply ice and heat.  Take muscle relaxer as directed.  I would recommend seeing a massage therapist, physical therapist, or a sports medicine doctor.  I have listed the contact information for sports medicine doctor on this discharge handout. ? ? ? ?  ?Roxy Horseman, PA-C ?08/15/21 0112 ? ?  ?Gilda Crease, MD ?08/15/21 0115 ? ?

## 2021-08-16 ENCOUNTER — Ambulatory Visit: Payer: Medicaid Other | Admitting: Orthopedic Surgery

## 2021-08-16 LAB — C-REACTIVE PROTEIN: CRP: 3.2 mg/dL — ABNORMAL HIGH (ref <1.0)

## 2021-08-16 MED ORDER — DIAZEPAM 5 MG PO TABS: 5.0000 mg | ORAL_TABLET | Freq: Two times a day (BID) | ORAL | 0 refills | Status: DC | PRN

## 2021-08-16 MED ORDER — NAPROXEN 500 MG PO TABS: 500.0000 mg | ORAL_TABLET | Freq: Two times a day (BID) | ORAL | 0 refills | Status: DC

## 2021-08-16 NOTE — ED Provider Notes (Signed)
Care assumed from PA Dorise Bullion at shift change, please see her note for full details, but in brief Alyssa Ray is a 31 y.o. female presents for ongoing neck pain.  She was seen in the ED for similar pain yesterday, was prescribed muscle relaxers without relief.  Reports pain has been going on for 7 to 10 days ever since she has slept over at her in-laws house and was not her usual bed.  Pain started primarily in the left side of her neck, has gotten worse to the point that it feels difficult to turn her head in either direction her neck feels very stiff.  She reports pain now radiates up into the posterior head.  She reports that today when pain was severe she had 3 brief episodes of seeing black spots in her vision, and this concerned her so she came back in for recheck.  She has not had any persistent or ongoing visual changes.  No nausea or vomiting.  No radiation of pain into her arms and no numbness, weakness or tingling in her extremities.  She is currently not experiencing any visual changes and complains primarily of pain starting in her neck and radiating up into the back of her head. ? ?Prior care team ordered lab work and CT of the head, labs included ESR and CRP which provider reported it was to assess for potential inflammatory condition. ? ?Patient without fevers or infectious symptoms. ? ?Patient given Toradol and Valium for symptomatic management.  Plan is to reevaluate patient and see if pain is improving and follow-up on CT and blood work. ? ?Physical Exam  ?BP 118/62   Pulse 76   Temp 97.7 ?F (36.5 ?C) (Oral)   Resp 18   Ht '5\' 4"'  (1.626 m)   Wt 127 kg   SpO2 99%   BMI 48.06 kg/m?  ? ?Physical Exam ?Vitals and nursing note reviewed.  ?Constitutional:   ?   General: She is not in acute distress. ?   Appearance: Normal appearance. She is well-developed. She is not ill-appearing or diaphoretic.  ?HENT:  ?   Head: Normocephalic and atraumatic.  ?Eyes:  ?   General:     ?   Right eye: No  discharge.     ?   Left eye: No discharge.  ?   Extraocular Movements: Extraocular movements intact.  ?   Pupils: Pupils are equal, round, and reactive to light.  ?Neck:  ?   Comments: Tenderness and palpable spasm over the posterior neck musculature, no overlying skin changes, step-off or deformity.  Limited range of motion of the neck.  Some tenderness over the occiput ?Cardiovascular:  ?   Rate and Rhythm: Normal rate.  ?Pulmonary:  ?   Effort: Pulmonary effort is normal. No respiratory distress.  ?Musculoskeletal:  ?   Cervical back: Tenderness present.  ?Neurological:  ?   Mental Status: She is alert and oriented to person, place, and time.  ?   Coordination: Coordination normal.  ?   Comments: Speech is clear, able to follow commands ?CN III-XII intact ?Normal strength in upper and lower extremities bilaterally including dorsiflexion and plantar flexion, strong and equal grip strength ?Sensation normal to light and sharp touch ?Moves extremities without ataxia, coordination intact ?Normal finger to nose and rapid alternating movements ?No pronator drift  ?Psychiatric:     ?   Mood and Affect: Mood normal.     ?   Behavior: Behavior normal.  ? ? ?Procedures  ?  Procedures ? ?ED Course / MDM  ? ?Care assumed from [facility], see her note for full details. ? ?On my exam patient has normal neurologic deficits, did not have headache with initial onset of pain and headache seems to be a byproduct of ongoing neck pain and spasm.  Suspect symptoms are most likely due to ongoing torticollis. ? ?CT of the head is unremarkable.  Lab work shows very mild hypokalemia, mild leukocytosis, normal hemoglobin.  Mildly elevated ESR and CRP of unclear significance.  Patient afebrile.  Presentation is not at all suggestive of meningitis. ? ?Patient treated with pain medication, Lidoderm patch and additional muscle relaxer and finally started to have significant improvement in her pain, was able to lay back and  sleep. ? ?Discussed with patient the symptoms are most likely due to ongoing spasm.  Will treat with Valium, NSAIDs, topical lidocaine patches, ice and heat.  And stressed the importance of close PCP follow-up if symptoms not improving.  Return precautions provided.  Patient discharged home in good condition. ? ?Final diagnoses:  ?Muscle spasms of neck  ? ?ED Discharge Orders   ? ?      Ordered  ?  diazepam (VALIUM) 5 MG tablet  Every 12 hours PRN       ? 08/16/21 0129  ?  naproxen (NAPROSYN) 500 MG tablet  2 times daily       ? 08/16/21 0129  ? ?  ?  ? ?  ? ? ? ? ?  ?Jacqlyn Larsen, [place] ?08/16/21 0129 ? ?  ?Blanchie Dessert, MD ?08/16/21 1628 ? ?

## 2021-08-29 ENCOUNTER — Ambulatory Visit: Payer: Medicaid Other | Admitting: Obstetrics and Gynecology

## 2021-10-02 ENCOUNTER — Encounter (HOSPITAL_COMMUNITY): Payer: Self-pay | Admitting: Psychiatry

## 2021-10-02 ENCOUNTER — Telehealth (INDEPENDENT_AMBULATORY_CARE_PROVIDER_SITE_OTHER): Payer: Medicaid Other | Admitting: Psychiatry

## 2021-10-02 DIAGNOSIS — F330 Major depressive disorder, recurrent, mild: Secondary | ICD-10-CM | POA: Insufficient documentation

## 2021-10-02 DIAGNOSIS — F603 Borderline personality disorder: Secondary | ICD-10-CM | POA: Diagnosis not present

## 2021-10-02 MED ORDER — BUPROPION HCL ER (XL) 150 MG PO TB24: 150.0000 mg | ORAL_TABLET | Freq: Every day | ORAL | 1 refills | Status: DC

## 2021-10-02 MED ORDER — HYDROXYZINE HCL 25 MG PO TABS: 25.0000 mg | ORAL_TABLET | Freq: Three times a day (TID) | ORAL | 0 refills | Status: DC | PRN
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
## 2021-12-25 ENCOUNTER — Encounter (HOSPITAL_COMMUNITY): Payer: Self-pay

## 2021-12-25 ENCOUNTER — Telehealth (HOSPITAL_COMMUNITY): Payer: Medicaid Other | Admitting: Psychiatry

## 2022-01-29 ENCOUNTER — Telehealth (INDEPENDENT_AMBULATORY_CARE_PROVIDER_SITE_OTHER): Payer: Self-pay | Admitting: Psychiatry

## 2022-01-29 ENCOUNTER — Encounter (HOSPITAL_COMMUNITY): Payer: Self-pay | Admitting: Psychiatry

## 2022-01-29 DIAGNOSIS — F603 Borderline personality disorder: Secondary | ICD-10-CM

## 2022-01-29 DIAGNOSIS — F330 Major depressive disorder, recurrent, mild: Secondary | ICD-10-CM

## 2022-01-29 DIAGNOSIS — F331 Major depressive disorder, recurrent, moderate: Secondary | ICD-10-CM

## 2022-01-29 MED ORDER — SERTRALINE HCL 100 MG PO TABS: 100.0000 mg | ORAL_TABLET | Freq: Every day | ORAL | 0 refills | Status: DC

## 2022-01-29 MED ORDER — BUSPIRONE HCL 30 MG PO TABS: 15.0000 mg | ORAL_TABLET | Freq: Two times a day (BID) | ORAL | 0 refills | Status: AC
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
## 2022-05-31 ENCOUNTER — Other Ambulatory Visit (HOSPITAL_COMMUNITY): Payer: Self-pay

## 2022-09-10 ENCOUNTER — Ambulatory Visit: Payer: Medicaid Other | Attending: Nurse Practitioner

## 2022-09-17 ENCOUNTER — Ambulatory Visit: Payer: Medicaid Other | Attending: Nurse Practitioner

## 2022-09-19 ENCOUNTER — Ambulatory Visit: Payer: Medicaid Other

## 2022-09-30 ENCOUNTER — Ambulatory Visit: Payer: Medicaid Other | Admitting: Internal Medicine

## 2022-10-22 ENCOUNTER — Ambulatory Visit: Payer: Medicaid Other | Admitting: Internal Medicine

## 2022-12-24 IMAGING — CT CT ABD-PELV W/ CM
2 of 4 series · 16 of 46 positions shown, 18 images · IV contrast (OMNIPAQUE 350)
Comparison: CT the abdomen and pelvis 05/18/2019.

CLINICAL DATA: 29-year-old female with history of epigastric pain
for the past 4 days.

EXAM:
CT ABDOMEN AND PELVIS WITH CONTRAST
TECHNIQUE: Multidetector CT imaging of the abdomen and pelvis was performed
using the standard protocol following bolus administration of
intravenous contrast.
CONTRAST:  75mL OMNIPAQUE IOHEXOL 350 MG/ML SOLN

[Series 2: axial st · axial · 0.81mm/px · z∈[-434,-54]mm · 13 of 86 slices shown, 15 images]
[im 5/86  soft-tissue]
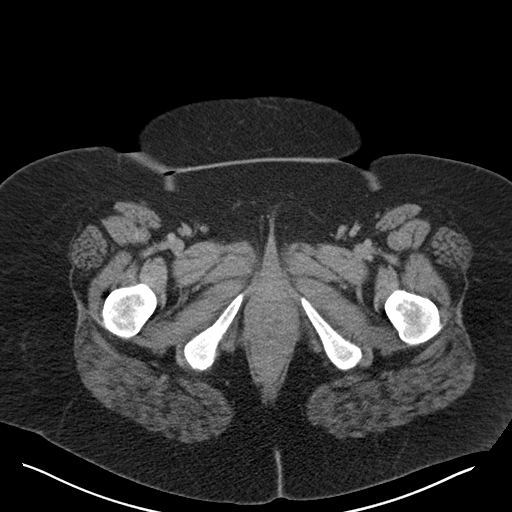
[im 5/86  bone]
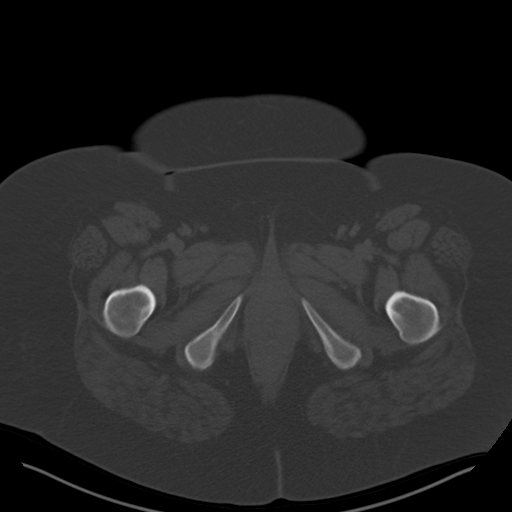
[im 14/86  soft-tissue]
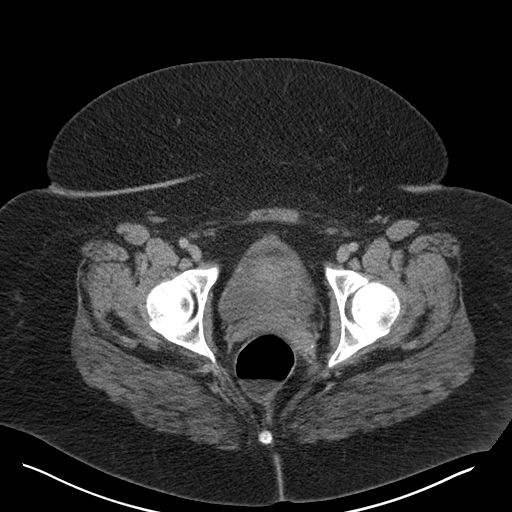
[im 18/86  soft-tissue]
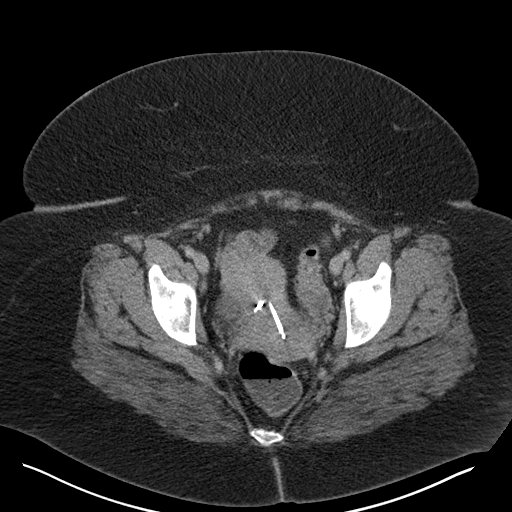
[im 23/86  soft-tissue]
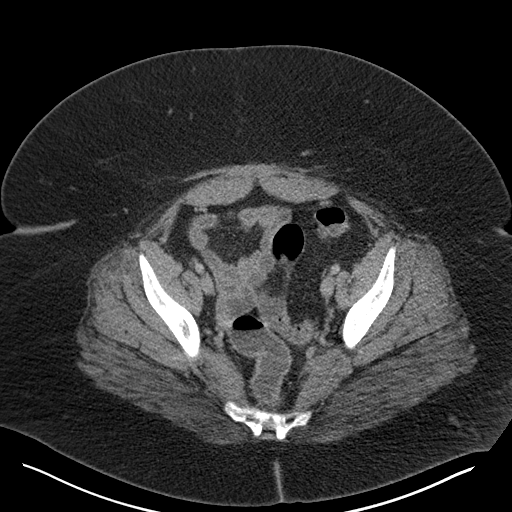
[im 32/86  soft-tissue]
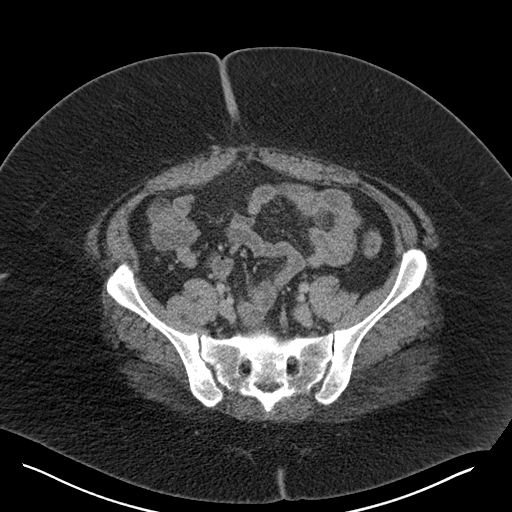
[im 36/86  soft-tissue]
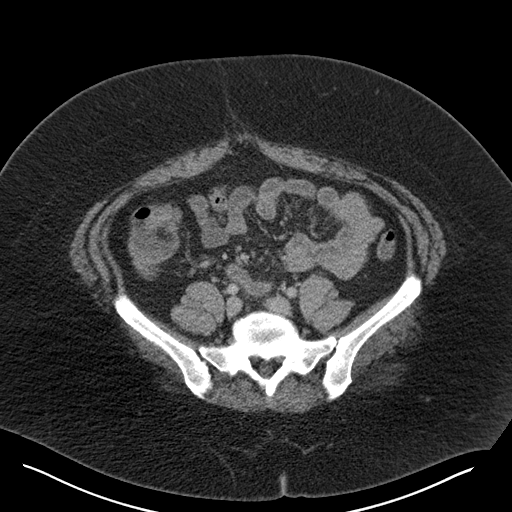
[im 45/86  soft-tissue]
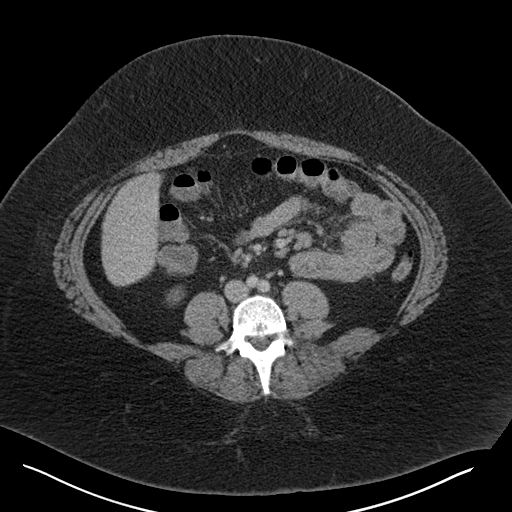
[im 50/86  soft-tissue]
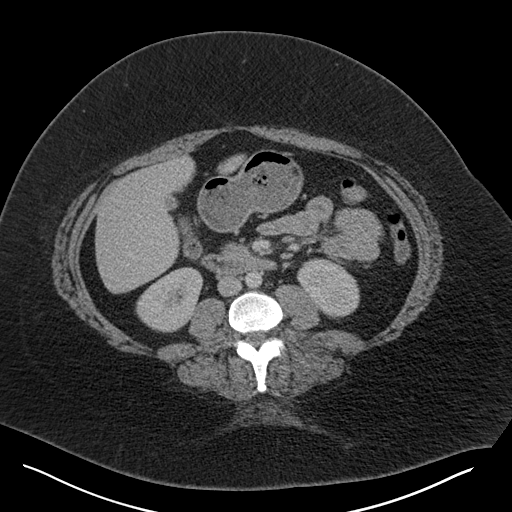
[im 54/86  soft-tissue]
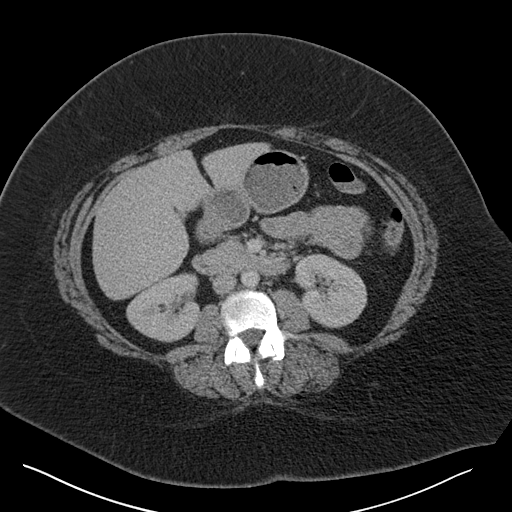
[im 54/86  bone]
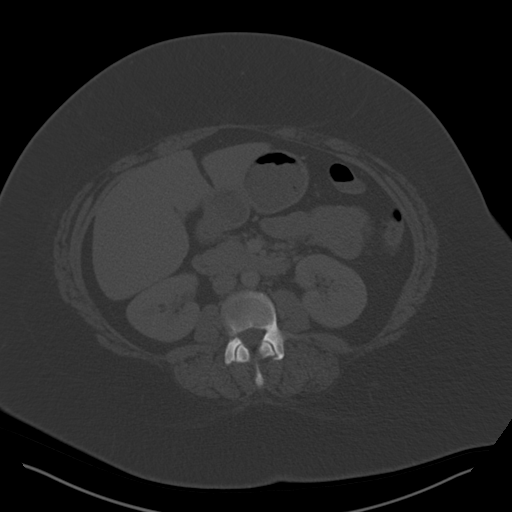
[im 63/86  soft-tissue]
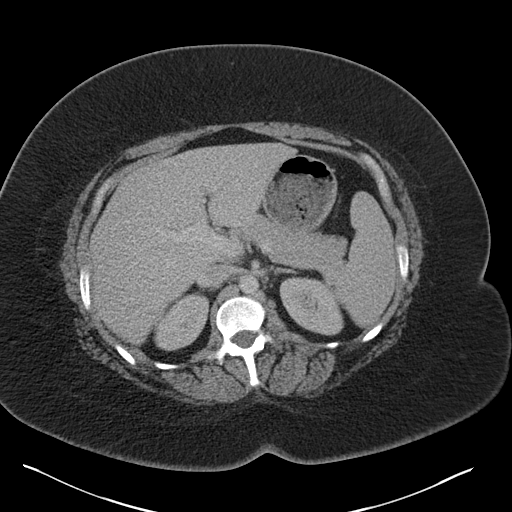
[im 68/86  soft-tissue]
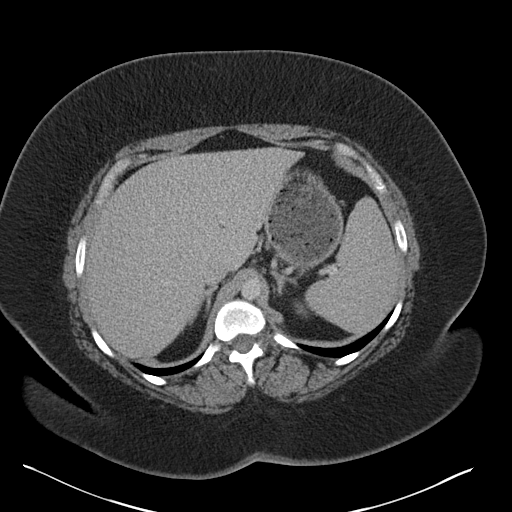
[im 72/86  soft-tissue]
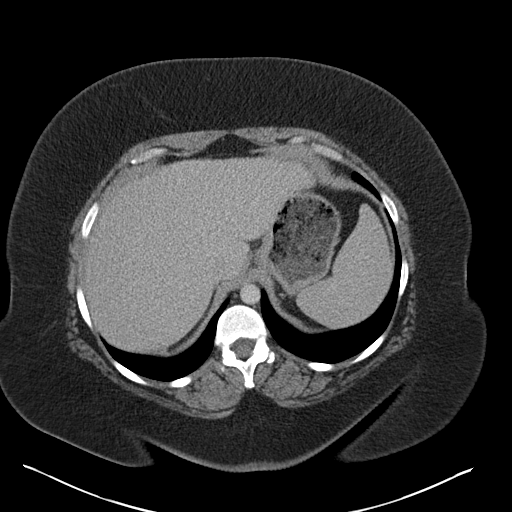
[im 81/86  soft-tissue]
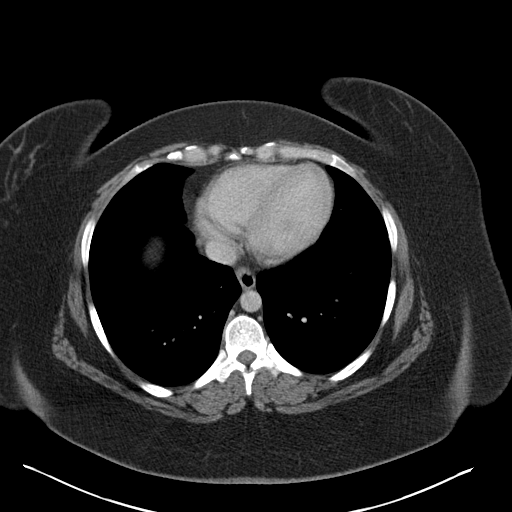

[Series 5: coronal st · coronal · 0.84mm/px · 3 of 169 slices shown]
[im 57/169  soft-tissue]
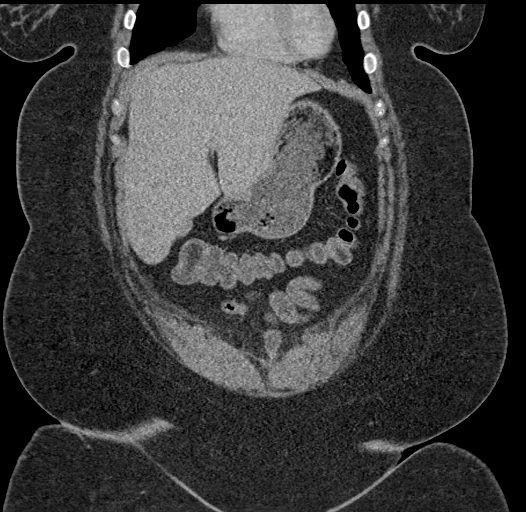
[im 75/169  soft-tissue]
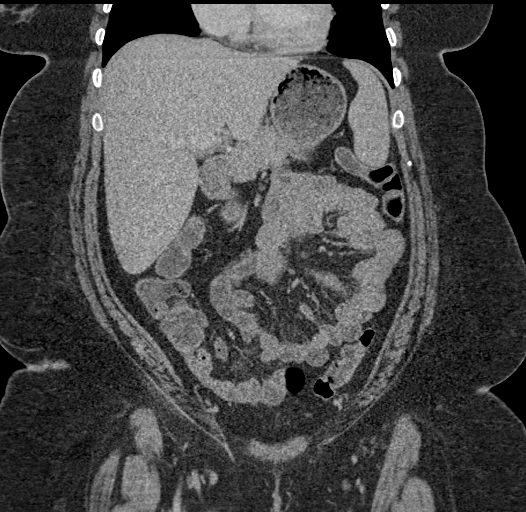
[im 94/169  soft-tissue]
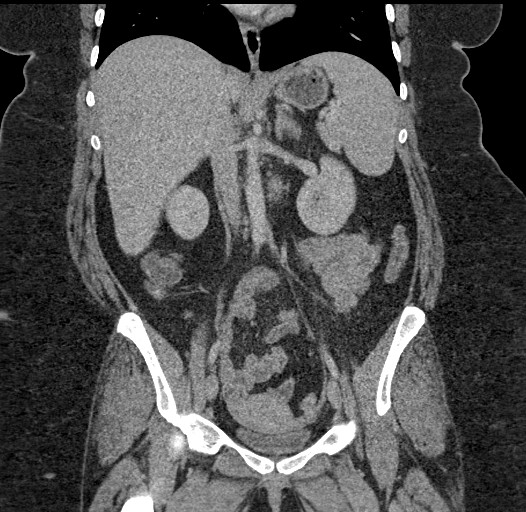

[16 of 46 positions shown; findings below may reference images not displayed]

FINDINGS: Lower chest: Unremarkable.

Hepatobiliary: No suspicious cystic or solid hepatic lesions. No
intra or extrahepatic biliary ductal dilatation. Gallbladder is
nearly completely decompressed, but otherwise unremarkable in
appearance.

Pancreas: No pancreatic mass. No pancreatic ductal dilatation. No
pancreatic or peripancreatic fluid collections or inflammatory
changes.

Spleen: Unremarkable.

Adrenals/Urinary Tract: Bilateral kidneys and adrenal glands are
normal in appearance. No hydroureteronephrosis. Urinary bladder is
normal in appearance.

Stomach/Bowel: Normal appearance of the stomach. No pathologic
dilatation of small bowel or colon. The appendix is not confidently
identified and may be surgically absent. Regardless, there are no
inflammatory changes noted adjacent to the cecum to suggest the
presence of an acute appendicitis at this time.

Vascular/Lymphatic: No significant atherosclerotic disease, aneurysm
or dissection noted in the abdominal or pelvic vasculature. No
lymphadenopathy noted in the abdomen or pelvis.

Reproductive: IUD present in the endometrial canal, apparently in a
low position with lateral arms in the mid body of the uterus.

Other: No significant volume of ascites.  No pneumoperitoneum.

Musculoskeletal: There are no aggressive appearing lytic or blastic
lesions noted in the visualized portions of the skeleton.
IMPRESSION: 1. No acute findings are noted in the abdomen or pelvis to account
for the patient's symptoms.
2. IUD appears in a slightly low position in the endometrial canal.
This could be further evaluated with nonemergent pelvic ultrasound
if of clinical concern.

## 2023-04-06 ENCOUNTER — Inpatient Hospital Stay (HOSPITAL_COMMUNITY)
Admission: AD | Admit: 2023-04-06 | Discharge: 2023-04-06 | Discharge status: Against Medical Advice | Payer: MEDICAID | Attending: Obstetrics & Gynecology | Admitting: Obstetrics & Gynecology

## 2023-04-06 DIAGNOSIS — Z3202 Encounter for pregnancy test, result negative: Secondary | ICD-10-CM | POA: Insufficient documentation

## 2023-04-06 DIAGNOSIS — N926 Irregular menstruation, unspecified: Secondary | ICD-10-CM | POA: Diagnosis not present

## 2023-04-06 DIAGNOSIS — O039 Complete or unspecified spontaneous abortion without complication: Secondary | ICD-10-CM | POA: Diagnosis present

## 2023-04-06 DIAGNOSIS — N939 Abnormal uterine and vaginal bleeding, unspecified: Secondary | ICD-10-CM | POA: Diagnosis not present

## 2023-04-06 LAB — POCT PREGNANCY, URINE: Preg Test, Ur: NEGATIVE

## 2023-04-06 LAB — HCG, QUANTITATIVE, PREGNANCY: hCG, Beta Chain, Quant, S: 1 m[IU]/mL (ref <5)

## 2023-04-06 NOTE — Progress Notes (Signed)
Attempted to call patient with HCG results, reached unidentified VM, no message left.  Patient MyChart message sent with results.   Wyn Forster, MD FMOB Fellow, Faculty practice [facility], Center for [facility]

## 2023-04-06 NOTE — Discharge Instructions (Signed)
Our urine and blood pregnancy tests were both negative.  You are not pregnant and you did not have a miscarriage.

## 2023-04-06 NOTE — MAU Note (Addendum)
Alyssa Ray is a 32 y.o. at [facility] here in MAU reporting: this morning, she started what she assumes is her period. Has used 3 tampons this morning. Pain has been horrible, had to leave work because the pain is so bad she had to leave work.. when she got home she was using the restroom, passed several clots, one was like golf ball size and she passed what looked like a baby.  She didn't even know she was preg.  In EMR there is a neg preg blood test on 9/23. Pt states I know my tests have all been neg, but I know what I saw. LMP: unsure, "they have been all over the place" Onset of complaint: 0745 Pain score: mild, not as bad now Vitals:   04/06/23 1311  BP: 135/81  Pulse: 74  Resp: 17  Temp: 98.2 F (36.8 C)  SpO2: 99%      Lab orders placed from triage:  UPT (neg)
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
## 2023-04-06 NOTE — MAU Note (Signed)
Not in lobby when went to D/C pt.  MD notified.

## 2023-05-29 ENCOUNTER — Encounter: Payer: Self-pay | Admitting: *Deleted

## 2023-06-12 ENCOUNTER — Encounter (HOSPITAL_COMMUNITY): Payer: Self-pay | Admitting: *Deleted

## 2023-06-12 ENCOUNTER — Inpatient Hospital Stay (HOSPITAL_COMMUNITY)
Admission: AD | Admit: 2023-06-12 | Discharge: 2023-06-12 | Disposition: A | Discharge status: Home / Self Care | Payer: MEDICAID | Attending: Family Medicine | Admitting: Family Medicine

## 2023-06-12 ENCOUNTER — Inpatient Hospital Stay (HOSPITAL_COMMUNITY): Payer: MEDICAID

## 2023-06-12 DIAGNOSIS — O209 Hemorrhage in early pregnancy, unspecified: Secondary | ICD-10-CM | POA: Diagnosis not present

## 2023-06-12 DIAGNOSIS — Z3A01 Less than 8 weeks gestation of pregnancy: Secondary | ICD-10-CM

## 2023-06-12 DIAGNOSIS — R109 Unspecified abdominal pain: Secondary | ICD-10-CM

## 2023-06-12 DIAGNOSIS — Z3491 Encounter for supervision of normal pregnancy, unspecified, first trimester: Secondary | ICD-10-CM

## 2023-06-12 DIAGNOSIS — R1031 Right lower quadrant pain: Secondary | ICD-10-CM | POA: Diagnosis not present

## 2023-06-12 DIAGNOSIS — O26891 Other specified pregnancy related conditions, first trimester: Secondary | ICD-10-CM | POA: Diagnosis not present

## 2023-06-12 DIAGNOSIS — O219 Vomiting of pregnancy, unspecified: Secondary | ICD-10-CM

## 2023-06-12 LAB — WET PREP, GENITAL
Clue Cells Wet Prep HPF POC: NONE SEEN
Sperm: NONE SEEN
Trich, Wet Prep: NONE SEEN
WBC, Wet Prep HPF POC: <10 (ref <10)
Yeast Wet Prep HPF POC: NONE SEEN

## 2023-06-12 LAB — CBC
HCT: 41 % (ref 36.0–46.0)
Hemoglobin: 13.1 g/dL (ref 12.0–15.0)
MCH: 27.6 pg (ref 26.0–34.0)
MCHC: 32 g/dL (ref 30.0–36.0)
MCV: 86.3 fL (ref 80.0–100.0)
Platelets: 253 10*3/uL (ref 150–400)
RBC: 4.75 MIL/uL (ref 3.87–5.11)
RDW: 13.3 % (ref 11.5–15.5)
WBC: 12.2 10*3/uL — ABNORMAL HIGH (ref 4.0–10.5)
nRBC: 0 % (ref 0.0–0.2)

## 2023-06-12 LAB — URINALYSIS, ROUTINE W REFLEX MICROSCOPIC
Bilirubin Urine: NEGATIVE
Glucose, UA: NEGATIVE mg/dL
Hgb urine dipstick: NEGATIVE
Ketones, ur: NEGATIVE mg/dL
Leukocytes,Ua: NEGATIVE
Nitrite: NEGATIVE
Protein, ur: NEGATIVE mg/dL
Specific Gravity, Urine: 1.003 — ABNORMAL LOW (ref 1.005–1.030)
pH: 7 (ref 5.0–8.0)

## 2023-06-12 LAB — HCG, QUANTITATIVE, PREGNANCY: hCG, Beta Chain, Quant, S: 15480 m[IU]/mL — ABNORMAL HIGH (ref <5)

## 2023-06-12 LAB — POCT PREGNANCY, URINE: Preg Test, Ur: POSITIVE — AB

## 2023-06-12 MED ORDER — ONDANSETRON 4 MG PO TBDP
4.0000 mg | ORAL_TABLET | Freq: Once | ORAL | Status: AC
  Administered 2023-06-12: 4 mg via ORAL
  Filled 2023-06-12: qty 1

## 2023-06-12 MED ORDER — PROMETHAZINE HCL 25 MG RE SUPP: 25.0000 mg | Freq: Four times a day (QID) | RECTAL | 2 refills | Status: DC | PRN

## 2023-06-12 MED ORDER — PROMETHAZINE HCL 25 MG PO TABS
25.0000 mg | ORAL_TABLET | Freq: Once | ORAL | Status: AC
  Administered 2023-06-12: 25 mg via ORAL
  Filled 2023-06-12: qty 1

## 2023-06-12 NOTE — MAU Note (Signed)
.  Alyssa Ray is a 32 y.o. at Unknown here in MAU reporting: pt started having pain on the right side that is sharp and getting stronger . Pain is constant .Started spotting today as well more brown now . Reports last intercourse last night.  LMP: 05/02/23(aprox) Onset of complaint: this afternoon Pain score: 7-8 Vitals:   06/12/23 1839  BP: 138/80  Pulse: 70  Resp: 18  Temp: 97.9 F (36.6 C)     FHT:n/a  Lab orders placed from triage: u/a, UPT, Wet prep GC
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
## 2023-06-13 ENCOUNTER — Other Ambulatory Visit: Payer: Self-pay | Admitting: Advanced Practice Midwife

## 2023-06-13 DIAGNOSIS — O219 Vomiting of pregnancy, unspecified: Secondary | ICD-10-CM

## 2023-06-13 LAB — GC/CHLAMYDIA PROBE AMP (~~LOC~~) NOT AT ARMC
Chlamydia: NEGATIVE
Comment: NEGATIVE
Comment: NORMAL
Neisseria Gonorrhea: NEGATIVE

## 2023-06-13 MED ORDER — PROMETHAZINE HCL 25 MG PO TABS: 25.0000 mg | ORAL_TABLET | Freq: Four times a day (QID) | ORAL | 2 refills | Status: DC | PRN

## 2023-06-30 ENCOUNTER — Other Ambulatory Visit: Payer: Self-pay

## 2023-06-30 ENCOUNTER — Inpatient Hospital Stay (HOSPITAL_COMMUNITY)
Admission: AD | Admit: 2023-06-30 | Discharge: 2023-06-30 | Disposition: A | Discharge status: Home / Self Care | Payer: MEDICAID | Attending: Obstetrics & Gynecology | Admitting: Obstetrics & Gynecology

## 2023-06-30 ENCOUNTER — Inpatient Hospital Stay (HOSPITAL_COMMUNITY): Payer: MEDICAID

## 2023-06-30 DIAGNOSIS — Z3A08 8 weeks gestation of pregnancy: Secondary | ICD-10-CM | POA: Diagnosis not present

## 2023-06-30 DIAGNOSIS — Z3A01 Less than 8 weeks gestation of pregnancy: Secondary | ICD-10-CM | POA: Diagnosis not present

## 2023-06-30 DIAGNOSIS — R109 Unspecified abdominal pain: Secondary | ICD-10-CM | POA: Diagnosis not present

## 2023-06-30 DIAGNOSIS — O208 Other hemorrhage in early pregnancy: Secondary | ICD-10-CM | POA: Insufficient documentation

## 2023-06-30 DIAGNOSIS — O0289 Other abnormal products of conception: Secondary | ICD-10-CM

## 2023-06-30 DIAGNOSIS — O26891 Other specified pregnancy related conditions, first trimester: Secondary | ICD-10-CM | POA: Insufficient documentation

## 2023-06-30 DIAGNOSIS — O10911 Unspecified pre-existing hypertension complicating pregnancy, first trimester: Secondary | ICD-10-CM | POA: Diagnosis present

## 2023-06-30 LAB — URINALYSIS, ROUTINE W REFLEX MICROSCOPIC
Bilirubin Urine: NEGATIVE
Glucose, UA: NEGATIVE mg/dL
Hgb urine dipstick: NEGATIVE
Ketones, ur: NEGATIVE mg/dL
Leukocytes,Ua: NEGATIVE
Nitrite: NEGATIVE
Protein, ur: NEGATIVE mg/dL
Specific Gravity, Urine: 1.015 (ref 1.005–1.030)
pH: 8 (ref 5.0–8.0)

## 2023-06-30 LAB — WET PREP, GENITAL
Sperm: NONE SEEN
Trich, Wet Prep: NONE SEEN
WBC, Wet Prep HPF POC: <10 (ref <10)
Yeast Wet Prep HPF POC: NONE SEEN
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
## 2023-06-30 NOTE — Discharge Instructions (Addendum)
We discussed that miscarriage is common with ~1/4 of women experiencing it in their lifetime. We discussed that there is nothing you did or did not do to cause this. We reviewed most common reason is presumed to be genetic abnormalities that allow a pregnancy to start but not continue past an early stage, but realistically we do not know the cause in most cases. We reviewed that studies show no definite difference between attempting another pregnancy sooner vs waiting, though some studies do show better live birth outcomes with trying sooner. We reviewed options of expectant, medical, or surgical management. After counseling you elected for surgical management. I have sent a message to our scheduler. We discussed you should expect a phone call in the next few days to schedule the procedure.  We discussed return precautions including crescendo abdominal pain, heavy vaginal bleeding soaking >1 pad/hour, and fever.

## 2023-06-30 NOTE — MAU Note (Signed)
 Alyssa Ray is a 33 y.o. at [redacted]w[redacted]d here in MAU reporting: Reports cramping and spotting for the past few days. Patient also reports when she used the restroom earlier today and wiped, she saw something that resembled a dark red clot or tissue. She reports she's only changed her panty liner once today and hasn't soaked through any pads.    Onset of complaint: 3 days ago  Pain score: 6/10 lower abdomen  Vitals:   06/30/23 1306  BP: 139/74  Pulse: 70  Resp: 17  Temp: 97.9 F (36.6 C)  SpO2: 99%     [id number] Lab orders placed from triage: GC/Chlamydia & wet prep, urine collected

## 2023-07-01 ENCOUNTER — Inpatient Hospital Stay (HOSPITAL_COMMUNITY): Payer: MEDICAID

## 2023-07-01 ENCOUNTER — Encounter (HOSPITAL_COMMUNITY): Payer: Self-pay | Admitting: Obstetrics & Gynecology

## 2023-07-01 ENCOUNTER — Encounter: Payer: Self-pay | Admitting: Family Medicine

## 2023-07-01 ENCOUNTER — Telehealth: Payer: Self-pay

## 2023-07-01 ENCOUNTER — Telehealth: Payer: Self-pay | Admitting: Family Medicine

## 2023-07-01 ENCOUNTER — Inpatient Hospital Stay (HOSPITAL_COMMUNITY)
Admission: AD | Admit: 2023-07-01 | Discharge: 2023-07-01 | Disposition: A | Discharge status: Home / Self Care | Payer: MEDICAID | Attending: Obstetrics and Gynecology | Admitting: Obstetrics and Gynecology

## 2023-07-01 ENCOUNTER — Encounter (HOSPITAL_COMMUNITY): Payer: Self-pay | Admitting: Obstetrics and Gynecology

## 2023-07-01 ENCOUNTER — Other Ambulatory Visit: Payer: Self-pay

## 2023-07-01 DIAGNOSIS — N939 Abnormal uterine and vaginal bleeding, unspecified: Secondary | ICD-10-CM

## 2023-07-01 DIAGNOSIS — O021 Missed abortion: Secondary | ICD-10-CM | POA: Diagnosis present

## 2023-07-01 DIAGNOSIS — Z3A08 8 weeks gestation of pregnancy: Secondary | ICD-10-CM

## 2023-07-01 LAB — CBC
HCT: 36.3 % (ref 36.0–46.0)
Hemoglobin: 11.8 g/dL — ABNORMAL LOW (ref 12.0–15.0)
MCH: 27.5 pg (ref 26.0–34.0)
MCHC: 32.5 g/dL (ref 30.0–36.0)
MCV: 84.6 fL (ref 80.0–100.0)
Platelets: 266 10*3/uL (ref 150–400)
RBC: 4.29 MIL/uL (ref 3.87–5.11)
RDW: 13.3 % (ref 11.5–15.5)
WBC: 10.8 10*3/uL — ABNORMAL HIGH (ref 4.0–10.5)
nRBC: 0 % (ref 0.0–0.2)

## 2023-07-01 LAB — GC/CHLAMYDIA PROBE AMP (~~LOC~~) NOT AT ARMC
Chlamydia: NEGATIVE
Comment: NEGATIVE
Comment: NORMAL
Neisseria Gonorrhea: NEGATIVE

## 2023-07-01 MED ORDER — ONDANSETRON HCL 4 MG PO TABS: 4.0000 mg | ORAL_TABLET | Freq: Four times a day (QID) | ORAL | 0 refills | Status: DC

## 2023-07-01 MED ORDER — ONDANSETRON HCL 4 MG/2ML IJ SOLN
4.0000 mg | Freq: Once | INTRAMUSCULAR | Status: AC
  Administered 2023-07-01: 4 mg via INTRAVENOUS
  Filled 2023-07-01: qty 2

## 2023-07-01 MED ORDER — HYDROMORPHONE HCL 1 MG/ML IJ SOLN
1.0000 mg | Freq: Once | INTRAMUSCULAR | Status: AC
  Administered 2023-07-01: 1 mg via INTRAVENOUS
  Filled 2023-07-01: qty 1

## 2023-07-01 MED ORDER — OXYCODONE HCL 5 MG PO TABS: 10.0000 mg | ORAL_TABLET | Freq: Once | ORAL | Status: DC

## 2023-07-01 MED ORDER — OXYCODONE HCL 5 MG PO TABS: 10.0000 mg | ORAL_TABLET | Freq: Four times a day (QID) | ORAL | 0 refills | Status: AC | PRN

## 2023-07-01 MED ORDER — ACETAMINOPHEN 500 MG PO TABS: 1000.0000 mg | ORAL_TABLET | Freq: Four times a day (QID) | ORAL | 0 refills | Status: AC | PRN

## 2023-07-01 MED ORDER — ONDANSETRON 4 MG PO TBDP: 8.0000 mg | ORAL_TABLET | Freq: Once | ORAL | Status: DC

## 2023-07-01 NOTE — Telephone Encounter (Signed)
 Patient says she is having a procedure done tomorrow 07/02/23 with Dr.A. She had questions for Dr.A before getting the procedure done. She would like to know if there is any way she can get an u/s done before the procedure. I let the patient know that Dr.A is not in the office but I will document this information and send it to the doctor so that she can reach out to the patient before the procedure.
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
## 2023-07-01 NOTE — Telephone Encounter (Signed)
 Called patient to provide surgery details. Patient is scheduled at [facility] on 07/02/23 at 3 pm. Patient was advised to arrive at [facility] at 1230 for Pre-op and she agreed. Patient would like written details sent to her Mychart. Confirmed surgery letter will be sent in 15 minutes.

## 2023-07-01 NOTE — MAU Note (Signed)
 Pt unable to leave urine sample at this time.

## 2023-07-01 NOTE — MAU Note (Signed)
.  Alyssa Ray is a 33 y.o. at [redacted]w[redacted]d here in MAU reporting: was here yesterday - scheduled for [facility] tomorrow. Today started having severe cramps and VB that started 1 hour ago - changed pad 3x.   Pain score: 8 Vitals:   07/01/23 2107  BP: (!) 158/94  Pulse: 86  Resp: 19  Temp: 98.4 F (36.9 C)  SpO2: 97%     FHT:NA  Lab orders placed from triage: UA

## 2023-07-01 NOTE — Progress Notes (Signed)
 PCP - [facility] Cardiologist -   PPM/ICD - denies Device Orders - n/a Rep Notified - n/a  Chest x-ray - denies EKG - denies Stress Test - denies ECHO - denies Cardiac Cath - denies  CPAP - denies  Dm denies  Blood Thinner Instructions: denies Aspirin  Instructions: n/a  ERAS Protcol - NPO  COVID TEST- n/a  Anesthesia review: no  Patient verbally denies any shortness of breath, fever, cough and chest pain during phone call   -------------  SDW INSTRUCTIONS given:  Your procedure is scheduled on July 02, 2023.  Report to [facility] Main Entrance A at 12:30 P.M., and check in at the Admitting office.  Call this number if you have problems the morning of surgery:  [phone]   Remember:  Do not eat or drink after midnight the night before your surgery      Take these medicines the morning of surgery with A SIP OF WATER  NONE  As of today, STOP taking any Aspirin  (unless otherwise instructed by your surgeon) Aleve , Naproxen , Ibuprofen , Motrin , Advil , Goody's, BC's, all herbal medications, fish oil, and all vitamins.                      Do not wear jewelry, make up, or nail polish            Do not wear lotions, powders, perfumes/colognes, or deodorant.            Do not shave 48 hours prior to surgery.  Men may shave face and neck.            Do not bring valuables to the hospital.            [facility] is not responsible for any belongings or valuables.  Do NOT Smoke (Tobacco/Vaping) 24 hours prior to your procedure If you use a CPAP at night, you may bring all equipment for your overnight stay.   Contacts, glasses, dentures or bridgework may not be worn into surgery.      For patients admitted to the hospital, discharge time will be determined by your treatment team.   Patients discharged the day of surgery will not be allowed to drive home, and someone needs to stay with them for 24 hours.    Special instructions:   [place]-  Preparing For Surgery  Before surgery, you can play an important role. Because skin is not sterile, your skin needs to be as free of germs as possible. You can reduce the number of germs on your skin by washing with CHG (chlorahexidine gluconate) Soap before surgery.  CHG is an antiseptic cleaner which kills germs and bonds with the skin to continue killing germs even after washing.    Oral Hygiene is also important to reduce your risk of infection.  Remember - BRUSH YOUR TEETH THE MORNING OF SURGERY WITH YOUR REGULAR TOOTHPASTE  Please do not use if you have an allergy to CHG or antibacterial soaps. If your skin becomes reddened/irritated stop using the CHG.  Do not shave (including legs and underarms) for at least 48 hours prior to first CHG shower. It is OK to shave your face.  Please follow these instructions carefully.   Shower the NIGHT BEFORE SURGERY and the MORNING OF SURGERY with DIAL Soap.   Pat yourself dry with a CLEAN TOWEL.  Wear CLEAN PAJAMAS to bed the night before surgery  Place CLEAN SHEETS on your bed the night of your  first shower and DO NOT SLEEP WITH PETS.   Day of Surgery: Please shower morning of surgery  Wear Clean/Comfortable clothing the morning of surgery Do not apply any deodorants/lotions.   Remember to brush your teeth WITH YOUR REGULAR TOOTHPASTE.   Questions were answered. Patient verbalized understanding of instructions.

## 2023-07-01 NOTE — Discharge Instructions (Signed)
 Follow up tomorrow as scheduled for your procedure Return to MAU precautions

## 2023-07-01 NOTE — Anesthesia Preprocedure Evaluation (Addendum)
 Anesthesia Evaluation  Patient identified by MRN, date of birth, ID band Patient awake    Reviewed: Allergy & Precautions, NPO status , Patient's Chart, lab work & pertinent test results  History of Anesthesia Complications (+) PONV and history of anesthetic complications  Airway Mallampati: III  TM Distance: >3 FB Neck ROM: Full  Mouth opening: Limited Mouth Opening  Dental  (+) Dental Advisory Given   Pulmonary neg shortness of breath, neg sleep apnea, neg COPD, neg recent URI, former smoker   Pulmonary exam normal breath sounds clear to auscultation       Cardiovascular hypertension, (-) angina (-) Past MI, (-) Cardiac Stents and (-) CABG (-) dysrhythmias  Rhythm:Regular Rate:Normal     Neuro/Psych  Headaches, neg Seizures PSYCHIATRIC DISORDERS (ADHD, borderline personality disorder) Anxiety Depression Bipolar Disorder      GI/Hepatic Neg liver ROS,GERD  ,,  Endo/Other  negative endocrine ROS    Renal/GU negative Renal ROS     Musculoskeletal   Abdominal  (+) + obese  Peds  Hematology negative hematology ROS (+) Lab Results      Component                Value               Date                      WBC                      12.2 (H)            06/12/2023                HGB                      13.1                06/12/2023                HCT                      41.0                06/12/2023                MCV                      86.3                06/12/2023                PLT                      253                 06/12/2023              Anesthesia Other Findings   Reproductive/Obstetrics                             Anesthesia Physical Anesthesia Plan  ASA: 3  Anesthesia Plan: General   Post-op Pain Management: Tylenol  PO (pre-op)*   Induction:   PONV Risk Score and Plan: 4 or greater and Ondansetron , Dexamethasone , Treatment may vary due to age or medical condition and  Scopolamine  patch - Pre-op  Airway Management Planned: LMA  Additional  Equipment:   Intra-op Plan:   Post-operative Plan: Extubation in OR  Informed Consent: I have reviewed the patients History and Physical, chart, labs and discussed the procedure including the risks, benefits and alternatives for the proposed anesthesia with the patient or authorized representative who has indicated his/her understanding and acceptance.     Dental advisory given  Plan Discussed with: CRNA and Anesthesiologist  Anesthesia Plan Comments: (Risks of general anesthesia discussed including, but not limited to, sore throat, hoarse voice, chipped/damaged teeth, injury to vocal cords, nausea and vomiting, allergic reactions, lung infection, heart attack, stroke, and death. All questions answered. )        Anesthesia Quick Evaluation

## 2023-07-02 ENCOUNTER — Ambulatory Visit (HOSPITAL_BASED_OUTPATIENT_CLINIC_OR_DEPARTMENT_OTHER): Payer: MEDICAID

## 2023-07-02 ENCOUNTER — Encounter (HOSPITAL_COMMUNITY): Payer: Self-pay | Admitting: Obstetrics & Gynecology

## 2023-07-02 ENCOUNTER — Other Ambulatory Visit: Payer: Self-pay

## 2023-07-02 ENCOUNTER — Encounter (HOSPITAL_COMMUNITY)
Admission: RE | Disposition: A | Discharge status: Home / Self Care | Payer: Self-pay | Source: Home / Self Care | Attending: Obstetrics & Gynecology

## 2023-07-02 ENCOUNTER — Ambulatory Visit (HOSPITAL_COMMUNITY): Payer: MEDICAID

## 2023-07-02 ENCOUNTER — Ambulatory Visit (HOSPITAL_COMMUNITY)
Admission: RE | Admit: 2023-07-02 | Discharge: 2023-07-02 | Disposition: A | Discharge status: Home / Self Care | Payer: MEDICAID | Attending: Obstetrics & Gynecology | Admitting: Obstetrics & Gynecology

## 2023-07-02 DIAGNOSIS — F418 Other specified anxiety disorders: Secondary | ICD-10-CM

## 2023-07-02 DIAGNOSIS — F319 Bipolar disorder, unspecified: Secondary | ICD-10-CM | POA: Diagnosis not present

## 2023-07-02 DIAGNOSIS — Z87891 Personal history of nicotine dependence: Secondary | ICD-10-CM | POA: Insufficient documentation

## 2023-07-02 DIAGNOSIS — F419 Anxiety disorder, unspecified: Secondary | ICD-10-CM | POA: Insufficient documentation

## 2023-07-02 DIAGNOSIS — K219 Gastro-esophageal reflux disease without esophagitis: Secondary | ICD-10-CM | POA: Diagnosis not present

## 2023-07-02 DIAGNOSIS — Z3A08 8 weeks gestation of pregnancy: Secondary | ICD-10-CM | POA: Insufficient documentation

## 2023-07-02 DIAGNOSIS — F603 Borderline personality disorder: Secondary | ICD-10-CM | POA: Diagnosis not present

## 2023-07-02 DIAGNOSIS — O99341 Other mental disorders complicating pregnancy, first trimester: Secondary | ICD-10-CM | POA: Insufficient documentation

## 2023-07-02 DIAGNOSIS — O10911 Unspecified pre-existing hypertension complicating pregnancy, first trimester: Secondary | ICD-10-CM | POA: Diagnosis not present

## 2023-07-02 DIAGNOSIS — O99611 Diseases of the digestive system complicating pregnancy, first trimester: Secondary | ICD-10-CM | POA: Insufficient documentation

## 2023-07-02 DIAGNOSIS — I10 Essential (primary) hypertension: Secondary | ICD-10-CM

## 2023-07-02 DIAGNOSIS — F909 Attention-deficit hyperactivity disorder, unspecified type: Secondary | ICD-10-CM | POA: Insufficient documentation

## 2023-07-02 DIAGNOSIS — O021 Missed abortion: Secondary | ICD-10-CM

## 2023-07-02 HISTORY — PX: DILATION AND EVACUATION: SHX1459

## 2023-07-02 HISTORY — DX: Essential (primary) hypertension: I10

## 2023-07-02 LAB — CBC
HCT: 34.3 % — ABNORMAL LOW (ref 36.0–46.0)
Hemoglobin: 10.8 g/dL — ABNORMAL LOW (ref 12.0–15.0)
MCH: 27.6 pg (ref 26.0–34.0)
MCHC: 31.5 g/dL (ref 30.0–36.0)
MCV: 87.5 fL (ref 80.0–100.0)
Platelets: 229 10*3/uL (ref 150–400)
RBC: 3.92 MIL/uL (ref 3.87–5.11)
RDW: 13.2 % (ref 11.5–15.5)
WBC: 10.7 10*3/uL — ABNORMAL HIGH (ref 4.0–10.5)
nRBC: 0 % (ref 0.0–0.2)

## 2023-07-02 SURGERY — DILATION AND EVACUATION, UTERUS
Anesthesia: General | Site: Vagina

## 2023-07-02 MED ORDER — PHENYLEPHRINE 80 MCG/ML (10ML) SYRINGE FOR IV PUSH (FOR BLOOD PRESSURE SUPPORT)
PREFILLED_SYRINGE | INTRAVENOUS | Status: AC
  Filled 2023-07-02: qty 10

## 2023-07-02 MED ORDER — OXYCODONE HCL 5 MG PO TABS
ORAL_TABLET | ORAL | Status: AC
  Filled 2023-07-02: qty 1

## 2023-07-02 MED ORDER — ORAL CARE MOUTH RINSE: 15.0000 mL | Freq: Once | OROMUCOSAL | Status: AC

## 2023-07-02 MED ORDER — IBUPROFEN 800 MG PO TABS: 800.0000 mg | ORAL_TABLET | Freq: Three times a day (TID) | ORAL | 3 refills | Status: AC | PRN

## 2023-07-02 MED ORDER — MIDAZOLAM HCL 2 MG/2ML IJ SOLN
INTRAMUSCULAR | Status: DC | PRN
  Administered 2023-07-02: 2 mg via INTRAVENOUS

## 2023-07-02 MED ORDER — PHENYLEPHRINE 80 MCG/ML (10ML) SYRINGE FOR IV PUSH (FOR BLOOD PRESSURE SUPPORT)
PREFILLED_SYRINGE | INTRAVENOUS | Status: DC | PRN
  Administered 2023-07-02: 80 ug via INTRAVENOUS
  Administered 2023-07-02: 200 ug via INTRAVENOUS

## 2023-07-02 MED ORDER — LIDOCAINE 2% (20 MG/ML) 5 ML SYRINGE
INTRAMUSCULAR | Status: DC | PRN
  Administered 2023-07-02: 100 mg via INTRAVENOUS

## 2023-07-02 MED ORDER — ONDANSETRON HCL 4 MG/2ML IJ SOLN
INTRAMUSCULAR | Status: DC | PRN
  Administered 2023-07-02: 4 mg via INTRAVENOUS

## 2023-07-02 MED ORDER — AMISULPRIDE (ANTIEMETIC) 5 MG/2ML IV SOLN
10.0000 mg | Freq: Once | INTRAVENOUS | Status: AC | PRN
  Administered 2023-07-02: 10 mg via INTRAVENOUS

## 2023-07-02 MED ORDER — CHLORHEXIDINE GLUCONATE 0.12 % MT SOLN
15.0000 mL | Freq: Once | OROMUCOSAL | Status: AC
  Administered 2023-07-02: 15 mL via OROMUCOSAL
  Filled 2023-07-02: qty 15

## 2023-07-02 MED ORDER — EPHEDRINE SULFATE-NACL 50-0.9 MG/10ML-% IV SOSY
PREFILLED_SYRINGE | INTRAVENOUS | Status: DC | PRN
  Administered 2023-07-02: 10 mg via INTRAVENOUS
  Administered 2023-07-02: 15 mg via INTRAVENOUS
  Administered 2023-07-02: 5 mg via INTRAVENOUS

## 2023-07-02 MED ORDER — MIDAZOLAM HCL 2 MG/2ML IJ SOLN
INTRAMUSCULAR | Status: AC
Start: 2023-07-02 — End: ?
  Filled 2023-07-02: qty 2

## 2023-07-02 MED ORDER — SCOPOLAMINE 1 MG/3DAYS TD PT72
1.0000 | MEDICATED_PATCH | Freq: Once | TRANSDERMAL | Status: DC
  Administered 2023-07-02: 1.5 mg via TRANSDERMAL
  Filled 2023-07-02: qty 1

## 2023-07-02 MED ORDER — FENTANYL CITRATE (PF) 250 MCG/5ML IJ SOLN
INTRAMUSCULAR | Status: DC | PRN
  Administered 2023-07-02: 50 ug via INTRAVENOUS

## 2023-07-02 MED ORDER — 0.9 % SODIUM CHLORIDE (POUR BTL) OPTIME
TOPICAL | Status: DC | PRN
  Administered 2023-07-02: 1000 mL

## 2023-07-02 MED ORDER — DEXMEDETOMIDINE HCL IN NACL 80 MCG/20ML IV SOLN
INTRAVENOUS | Status: AC
  Filled 2023-07-02: qty 20

## 2023-07-02 MED ORDER — DOXYCYCLINE HYCLATE 100 MG IV SOLR
200.0000 mg | INTRAVENOUS | Status: DC
  Filled 2023-07-02: qty 200

## 2023-07-02 MED ORDER — BUPIVACAINE HCL 0.5 % IJ SOLN
INTRAMUSCULAR | Status: DC | PRN
  Administered 2023-07-02: 30 mL

## 2023-07-02 MED ORDER — ACETAMINOPHEN 500 MG PO TABS
1000.0000 mg | ORAL_TABLET | ORAL | Status: AC
  Administered 2023-07-02: 1000 mg via ORAL
  Filled 2023-07-02: qty 2

## 2023-07-02 MED ORDER — FENTANYL CITRATE (PF) 250 MCG/5ML IJ SOLN
INTRAMUSCULAR | Status: AC
  Filled 2023-07-02: qty 5

## 2023-07-02 MED ORDER — CELECOXIB 200 MG PO CAPS
400.0000 mg | ORAL_CAPSULE | ORAL | Status: AC
  Administered 2023-07-02: 400 mg via ORAL
  Filled 2023-07-02: qty 2

## 2023-07-02 MED ORDER — DEXMEDETOMIDINE HCL IN NACL 80 MCG/20ML IV SOLN
INTRAVENOUS | Status: DC | PRN
  Administered 2023-07-02: 8 ug via INTRAVENOUS
  Administered 2023-07-02: 12 ug via INTRAVENOUS

## 2023-07-02 MED ORDER — AMISULPRIDE (ANTIEMETIC) 5 MG/2ML IV SOLN
INTRAVENOUS | Status: AC
  Filled 2023-07-02: qty 4

## 2023-07-02 MED ORDER — PROPOFOL 10 MG/ML IV BOLUS
INTRAVENOUS | Status: DC | PRN
  Administered 2023-07-02: 200 mg via INTRAVENOUS

## 2023-07-02 MED ORDER — OXYCODONE HCL 5 MG PO TABS
5.0000 mg | ORAL_TABLET | Freq: Once | ORAL | Status: AC | PRN
  Administered 2023-07-02: 5 mg via ORAL

## 2023-07-02 MED ORDER — OXYCODONE HCL 5 MG/5ML PO SOLN
5.0000 mg | Freq: Once | ORAL | Status: AC | PRN
Start: 2023-07-02 — End: 2023-07-02

## 2023-07-02 MED ORDER — SODIUM CHLORIDE 0.9 % IV SOLN: INTRAVENOUS | Status: DC

## 2023-07-02 MED ORDER — FENTANYL CITRATE (PF) 100 MCG/2ML IJ SOLN: 25.0000 ug | INTRAMUSCULAR | Status: DC | PRN

## 2023-07-02 MED ORDER — ONDANSETRON HCL 4 MG/2ML IJ SOLN
INTRAMUSCULAR | Status: AC
  Filled 2023-07-02: qty 2

## 2023-07-02 MED ORDER — DEXAMETHASONE SODIUM PHOSPHATE 10 MG/ML IJ SOLN
INTRAMUSCULAR | Status: AC
  Filled 2023-07-02: qty 1

## 2023-07-02 MED ORDER — DOCUSATE SODIUM 100 MG PO CAPS: 100.0000 mg | ORAL_CAPSULE | Freq: Two times a day (BID) | ORAL | 2 refills | Status: AC | PRN

## 2023-07-02 MED ORDER — LACTATED RINGERS IV SOLN: INTRAVENOUS | Status: DC

## 2023-07-02 MED ORDER — ACETAMINOPHEN 500 MG PO TABS: 1000.0000 mg | ORAL_TABLET | Freq: Once | ORAL | Status: DC

## 2023-07-02 MED ORDER — LIDOCAINE 2% (20 MG/ML) 5 ML SYRINGE
INTRAMUSCULAR | Status: AC
  Filled 2023-07-02: qty 5

## 2023-07-02 MED ORDER — BUPIVACAINE HCL (PF) 0.5 % IJ SOLN
INTRAMUSCULAR | Status: AC
  Filled 2023-07-02: qty 30

## 2023-07-02 SURGICAL SUPPLY — 20 items
CATH ROBINSON RED A/P 16FR (CATHETERS) ×1 IMPLANT
FILTER UTR ASPR ASSEMBLY (MISCELLANEOUS) ×1 IMPLANT
GLOVE BIOGEL PI IND STRL 7.0 (GLOVE) ×1 IMPLANT
GLOVE ECLIPSE 7.0 STRL STRAW (GLOVE) ×1 IMPLANT
GOWN STRL REUS W/ TWL LRG LVL3 (GOWN DISPOSABLE) ×2 IMPLANT
HOSE CONNECTING 18IN BERKELEY (TUBING) ×1 IMPLANT
KIT BERKELEY 1ST TRI 3/8 NO TR (MISCELLANEOUS) ×1 IMPLANT
KIT BERKELEY 1ST TRIMESTER 3/8 (MISCELLANEOUS) ×1 IMPLANT
NS IRRIG 1000ML POUR BTL (IV SOLUTION) ×1 IMPLANT
PACK VAGINAL MINOR WOMEN LF (CUSTOM PROCEDURE TRAY) ×1 IMPLANT
PAD OB MATERNITY 4.3X12.25 (PERSONAL CARE ITEMS) ×1 IMPLANT
SET BERKELEY SUCTION TUBING (SUCTIONS) ×1 IMPLANT
SPIKE FLUID TRANSFER (MISCELLANEOUS) ×1 IMPLANT
TOWEL GREEN STERILE FF (TOWEL DISPOSABLE) ×2 IMPLANT
UNDERPAD 30X36 HEAVY ABSORB (UNDERPADS AND DIAPERS) ×1 IMPLANT
VACURETTE 10 RIGID CVD (CANNULA) IMPLANT
VACURETTE 6 ASPIR F TIP BERK (CANNULA) IMPLANT
VACURETTE 7MM CVD STRL WRAP (CANNULA) IMPLANT
VACURETTE 8 RIGID CVD (CANNULA) IMPLANT
VACURETTE 9 RIGID CVD (CANNULA) IMPLANT

## 2023-07-02 NOTE — Transfer of Care (Signed)
 Immediate Anesthesia Transfer of Care Note  Patient: Alyssa Ray  Procedure(s) Performed: DILATATION AND EVACUATION W/ [place] (Vagina )  Patient Location: PACU  Anesthesia Type:General  Level of Consciousness: awake, alert , and patient cooperative  Airway & Oxygen Therapy: Patient Spontanous Breathing and Patient connected to face mask oxygen  Post-op Assessment: Report given to RN and Post -op Vital signs reviewed and stable  Post vital signs: Reviewed and stable  Last Vitals:  Vitals Value Taken Time  BP 145/91 07/02/23 1615  Temp 36.3 C 07/02/23 1610  Pulse 94 07/02/23 1617  Resp 19 07/02/23 1617  SpO2 95 % 07/02/23 1617  Vitals shown include unfiled device data.  Last Pain:  Vitals:   07/02/23 1610  TempSrc:   PainSc: 0-No pain      Patients Stated Pain Goal: 0 (07/02/23 1305)  Complications: No notable events documented.
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
## 2023-07-02 NOTE — Op Note (Signed)
 Carolan Chuck PROCEDURE DATE: 07/02/2023  PREOPERATIVE DIAGNOSIS: Missed abortion around eight weeks gestation POSTOPERATIVE DIAGNOSIS: The same PROCEDURE:     Dilation and Evacuation with chromosomal studies SURGEON:  Dr. Lenoard Rad  INDICATIONS: 33 y.o. [id number] with MAB measuring around eight weeks gestation, needing surgical completion.  Risks of surgery were discussed with the patient including but not limited to: bleeding which may require transfusion; infection which may require antibiotics; injury to uterus or surrounding organs; need for additional procedures including laparotomy or laparoscopy; possibility of intrauterine scarring which may impair future fertility; and other postoperative/anesthesia complications. Written informed consent was obtained.    FINDINGS:  A 8 week size uterus, moderate amounts of products of conception, specimen sent to pathology.  ANESTHESIA: General-LMA, paracervical block. ESTIMATED BLOOD LOSS:  Less than 20 ml. SPECIMENS:  Products of conception sent to pathology and some products of conception were sent for [facility] genetic analysis COMPLICATIONS:  None immediate.  PROCEDURE DETAILS:  The patient received intravenous Doxycycline  while in the preoperative area.  She was then taken to the operating room where general anesthesia was administered and was found to be adequate.  After an adequate timeout was performed, she was placed in the dorsal lithotomy position and examined; then prepped and draped in the sterile manner.   Her bladder was catheterized for an unmeasured amount of clear, yellow urine. A vaginal speculum was then placed in the patient's vagina and a single tooth tenaculum was applied to the anterior lip of the cervix.  A paracervical block using 30 ml of 0.5% Marcaine  was administered. The cervix was gently dilated to accommodate a 8 mm suction curette that was gently advanced to the uterine fundus. The suction device was then activated and  curette slowly rotated to clear the uterus of products of conception.  Suction curettage was done until complete emptying of the uterus was confirmed. There was minimal bleeding noted and the tenaculum removed with good hemostasis noted.   All instruments were removed from the patient's vagina.  Sponge and instrument counts were correct times three.  The patient tolerated the procedure well and was taken to the recovery area extubated, awake, and in stable condition.  The patient will be discharged to home as per PACU criteria.  Routine postoperative instructions given.  She was prescribed Oxycodone , Ibuprofen  and Colace.  She will follow up in the office in 2-3 weeks for postoperative evaluation   Lenoard Rad, MD, FACOG Obstetrician & Gynecologist, [facility] for [employer], [facility]

## 2023-07-02 NOTE — Anesthesia Procedure Notes (Signed)
 Procedure Name: LMA Insertion Date/Time: 07/02/2023 3:16 PM  Performed by: Candance Certain, CRNAPre-anesthesia Checklist: Patient identified, Emergency Drugs available, Suction available and Patient being monitored Patient Re-evaluated:Patient Re-evaluated prior to induction Oxygen Delivery Method: Circle System Utilized Preoxygenation: Pre-oxygenation with 100% oxygen Induction Type: IV induction LMA: LMA inserted LMA Size: 4.0 Number of attempts: 1 Airway Equipment and Method: Bite block Placement Confirmation: positive ETCO2 Tube secured with: Tape Dental Injury: Teeth and Oropharynx as per pre-operative assessment

## 2023-07-03 ENCOUNTER — Encounter (HOSPITAL_COMMUNITY): Payer: Self-pay | Admitting: Obstetrics & Gynecology

## 2023-07-03 NOTE — Anesthesia Postprocedure Evaluation (Signed)
Anesthesia Post Note  Patient: Alyssa Ray  Procedure(s) Performed: DILATATION AND EVACUATION W/ ANORA (Vagina )     Patient location during evaluation: PACU Anesthesia Type: General Level of consciousness: sedated and patient cooperative Pain management: pain level controlled Vital Signs Assessment: post-procedure vital signs reviewed and stable Respiratory status: spontaneous breathing Cardiovascular status: stable Anesthetic complications: no   No notable events documented.  Last Vitals:  Vitals:   07/02/23 1630 07/02/23 1645  BP: (!) 144/77 138/82  Pulse: 82 82  Resp: 18 15  Temp:  36.5 C  SpO2: 96% 98%    Last Pain:  Vitals:   07/02/23 1630  TempSrc:   PainSc: 5                  Lewie Loron

## 2023-07-04 LAB — SURGICAL PATHOLOGY

## 2023-07-10 LAB — ANORA MISCARRIAGE TEST - FRESH

## 2023-07-11 ENCOUNTER — Encounter: Payer: Self-pay | Admitting: Obstetrics & Gynecology

## 2023-07-16 ENCOUNTER — Telehealth: Payer: MEDICAID

## 2023-07-30 ENCOUNTER — Ambulatory Visit: Payer: MEDICAID | Admitting: Obstetrics and Gynecology

## 2023-08-18 ENCOUNTER — Encounter: Payer: Self-pay | Admitting: Obstetrics and Gynecology

## 2023-08-18 ENCOUNTER — Ambulatory Visit: Payer: MEDICAID | Admitting: Obstetrics and Gynecology

## 2023-08-19 NOTE — Progress Notes (Signed)
 Patient did not keep her GYN appointment for 08/18/2023.  Cornelia Copa MD Attending Center for [employer] Midwife)

## 2023-09-23 ENCOUNTER — Ambulatory Visit: Payer: MEDICAID | Admitting: Obstetrics and Gynecology
